# Patient Record
Sex: Female | Born: 1985
Health system: Southern US, Community
[De-identification: ages and names within clinical notes are randomized; demographics above are authoritative.]

## PROBLEM LIST (undated history)

## (undated) DIAGNOSIS — B999 Unspecified infectious disease: Secondary | ICD-10-CM

## (undated) DIAGNOSIS — F419 Anxiety disorder, unspecified: Secondary | ICD-10-CM

## (undated) DIAGNOSIS — Z973 Presence of spectacles and contact lenses: Secondary | ICD-10-CM

## (undated) DIAGNOSIS — O1490 Unspecified pre-eclampsia, unspecified trimester: Secondary | ICD-10-CM

## (undated) DIAGNOSIS — E559 Vitamin D deficiency, unspecified: Secondary | ICD-10-CM

## (undated) DIAGNOSIS — Z8709 Personal history of other diseases of the respiratory system: Secondary | ICD-10-CM

## (undated) DIAGNOSIS — D649 Anemia, unspecified: Secondary | ICD-10-CM

## (undated) DIAGNOSIS — IMO0002 Reserved for concepts with insufficient information to code with codable children: Secondary | ICD-10-CM

## (undated) DIAGNOSIS — Z8759 Personal history of other complications of pregnancy, childbirth and the puerperium: Secondary | ICD-10-CM

## (undated) DIAGNOSIS — Z8619 Personal history of other infectious and parasitic diseases: Secondary | ICD-10-CM

## (undated) DIAGNOSIS — N96 Recurrent pregnancy loss: Secondary | ICD-10-CM

## (undated) DIAGNOSIS — E611 Iron deficiency: Secondary | ICD-10-CM

## (undated) HISTORY — DX: Unspecified pre-eclampsia, unspecified trimester: O14.90

## (undated) HISTORY — DX: Reserved for concepts with insufficient information to code with codable children: IMO0002

## (undated) HISTORY — DX: Anxiety disorder, unspecified: F41.9

## (undated) HISTORY — DX: Iron deficiency: E61.1

## (undated) HISTORY — DX: Anemia, unspecified: D64.9

## (undated) HISTORY — DX: Vitamin D deficiency, unspecified: E55.9

## (undated) HISTORY — PX: COLONOSCOPY: SHX174

## (undated) HISTORY — PX: WISDOM TOOTH EXTRACTION: SHX21

## (undated) HISTORY — DX: Unspecified infectious disease: B99.9

---

## 2008-10-07 DIAGNOSIS — IMO0002 Reserved for concepts with insufficient information to code with codable children: Secondary | ICD-10-CM

## 2008-10-07 DIAGNOSIS — Z8742 Personal history of other diseases of the female genital tract: Secondary | ICD-10-CM

## 2008-10-07 HISTORY — DX: Personal history of other diseases of the female genital tract: Z87.42

## 2008-10-07 HISTORY — DX: Reserved for concepts with insufficient information to code with codable children: IMO0002

## 2011-07-22 ENCOUNTER — Ambulatory Visit: Payer: 59 | Admitting: Gynecology

## 2011-07-22 DIAGNOSIS — O3680X0 Pregnancy with inconclusive fetal viability, not applicable or unspecified: Secondary | ICD-10-CM

## 2011-07-22 DIAGNOSIS — Z3400 Encounter for supervision of normal first pregnancy, unspecified trimester: Secondary | ICD-10-CM

## 2011-07-22 LAB — POCT URINE PREGNANCY: Preg Test, Ur: POSITIVE

## 2011-07-23 LAB — OBSTETRIC PANEL
Antibody Screen: NEGATIVE
Basophils Absolute: 0 10*3/uL (ref 0.0–0.1)
Basophils Relative: 1 % (ref 0–1)
Eosinophils Absolute: 0.2 10*3/uL (ref 0.0–0.7)
Eosinophils Relative: 3 % (ref 0–5)
HCT: 34.7 % — ABNORMAL LOW (ref 36.0–46.0)
Hemoglobin: 11.5 g/dL — ABNORMAL LOW (ref 12.0–15.0)
Hepatitis B Surface Ag: NEGATIVE
Lymphocytes Relative: 22 % (ref 12–46)
Lymphs Abs: 1.8 10*3/uL (ref 0.7–4.0)
MCH: 29.5 pg (ref 26.0–34.0)
MCHC: 33.1 g/dL (ref 30.0–36.0)
MCV: 89 fL (ref 78.0–100.0)
Monocytes Absolute: 0.6 10*3/uL (ref 0.1–1.0)
Monocytes Relative: 8 % (ref 3–12)
Neutro Abs: 5.5 10*3/uL (ref 1.7–7.7)
Neutrophils Relative %: 67 % (ref 43–77)
Platelets: 260 10*3/uL (ref 150–400)
RBC: 3.9 MIL/uL (ref 3.87–5.11)
RDW: 13.2 % (ref 11.5–15.5)
Rh Type: NEGATIVE
Rubella: 115.7 IU/mL — ABNORMAL HIGH
WBC: 8.1 10*3/uL (ref 4.0–10.5)

## 2011-07-23 LAB — HEPATITIS C ANTIBODY: HCV Ab: NEGATIVE

## 2011-07-23 LAB — HIV ANTIBODY (ROUTINE TESTING W REFLEX): HIV: NONREACTIVE

## 2011-07-23 LAB — HEPATITIS B CORE ANTIBODY, IGM: Hep B C IgM: NEGATIVE

## 2011-07-24 LAB — CULTURE, URINE COMPREHENSIVE
Colony Count: NO GROWTH
Organism ID, Bacteria: NO GROWTH

## 2011-07-31 ENCOUNTER — Other Ambulatory Visit: Payer: Self-pay | Admitting: Gynecology

## 2011-07-31 ENCOUNTER — Ambulatory Visit (HOSPITAL_COMMUNITY)
Admission: RE | Admit: 2011-07-31 | Discharge: 2011-07-31 | Disposition: A | Discharge status: Home / Self Care | Payer: 59 | Source: Ambulatory Visit | Attending: Obstetrics & Gynecology | Admitting: Obstetrics & Gynecology

## 2011-07-31 DIAGNOSIS — O3680X0 Pregnancy with inconclusive fetal viability, not applicable or unspecified: Secondary | ICD-10-CM

## 2011-07-31 DIAGNOSIS — Z3689 Encounter for other specified antenatal screening: Secondary | ICD-10-CM | POA: Insufficient documentation

## 2011-08-07 ENCOUNTER — Ambulatory Visit (INDEPENDENT_AMBULATORY_CARE_PROVIDER_SITE_OTHER): Payer: 59 | Admitting: Family Medicine

## 2011-08-07 ENCOUNTER — Encounter: Payer: Self-pay | Admitting: Family Medicine

## 2011-08-07 DIAGNOSIS — O0990 Supervision of high risk pregnancy, unspecified, unspecified trimester: Secondary | ICD-10-CM | POA: Insufficient documentation

## 2011-08-07 DIAGNOSIS — J45909 Unspecified asthma, uncomplicated: Secondary | ICD-10-CM

## 2011-08-07 DIAGNOSIS — Z3400 Encounter for supervision of normal first pregnancy, unspecified trimester: Secondary | ICD-10-CM

## 2011-08-07 DIAGNOSIS — Z113 Encounter for screening for infections with a predominantly sexual mode of transmission: Secondary | ICD-10-CM

## 2011-08-07 DIAGNOSIS — Z1272 Encounter for screening for malignant neoplasm of vagina: Secondary | ICD-10-CM

## 2011-08-07 HISTORY — DX: Unspecified asthma, uncomplicated: J45.909

## 2011-08-07 NOTE — Progress Notes (Signed)
   Subjective:    Gloria Dean is a G1P0 [redacted]w[redacted]d being seen today for her first obstetrical visit.  Her obstetrical history is significant for no problems.. Patient does intend to breast feed. Pregnancy history fully reviewed.  Patient reports no complaints.  Filed Vitals:   08/07/11 0900  BP: 99/54  Weight: 115 lb (52.164 kg)    HISTORY: OB History    Grav Para Term Preterm Abortions TAB SAB Ect Mult Living   1              # Outc Date GA Lbr Len/2nd Wgt Sex Del Anes PTL Lv   1 CUR              Past Medical History  Diagnosis Date  . Abnormal Pap smear 2010  . Infection     chylamidia  . Asthma    Past Surgical History  Procedure Date  . Wisdom tooth extraction    Family History  Problem Relation Age of Onset  . Cancer Maternal Grandfather     lung cancer     Exam    Uterine Size: size equals dates  Pelvic Exam:    Perineum: Normal Perineum   Vulva: normal   Vagina:  normal mucosa       Cervix: nulliparous appearance   Adnexa: normal adnexa   Bony Pelvis: average  System:     Skin: normal coloration and turgor, no rashes    Neurologic: oriented, normal   Extremities: normal strength, tone, and muscle mass   HEENT PERRLA   Mouth/Teeth mucous membranes moist, pharynx normal without lesions   Neck supple   Cardiovascular: regular rate and rhythm   Respiratory:  appears well, vitals normal, no respiratory distress, acyanotic, normal RR, ear and throat exam is normal, neck free of mass or lymphadenopathy, chest clear, no wheezing, crepitations, rhonchi, normal symmetric air entry   Abdomen: soft, non-tender; bowel sounds normal; no masses,  no organomegaly   Urinary: urethral meatus normal      Assessment:    Pregnancy: G1P0 Patient Active Problem List  Diagnoses  . Supervision of normal first pregnancy  . Asthma with allergic rhinitis        Plan:     Initial labs drawn. Prenatal vitamins. Problem list reviewed and updated. Genetic  Screening discussed First Screen: requested.  Ultrasound discussed; fetal survey: discussed.  Follow up in 4 weeks.   PRATT,TANYA S 08/07/2011

## 2011-08-07 NOTE — Patient Instructions (Signed)
Pregnancy - First Trimester During sexual intercourse, millions of sperm go into the vagina. Only 1 sperm will penetrate and fertilize the female egg while it is in the Fallopian tube. One week later, the fertilized egg implants into the wall of the uterus. An embryo begins to develop into a baby. At 6 to 8 weeks, the eyes and face are formed and the heartbeat can be seen on ultrasound. At the end of 12 weeks (first trimester), all the baby's organs are formed. Now that you are pregnant, you will want to do everything you can to have a healthy baby. Two of the most important things are to get good prenatal care and follow your caregiver's instructions. Prenatal care is all the medical care you receive before the baby's birth. It is given to prevent, find, and treat problems during the pregnancy and childbirth. PRENATAL EXAMS  During prenatal visits, your weight, blood pressure and urine are checked. This is done to make sure you are healthy and progressing normally during the pregnancy.   A pregnant woman should gain 25 to 35 pounds during the pregnancy. However, if you are over weight or underweight, your caregiver will advise you regarding your weight.   Your caregiver will ask and answer questions for you.   Blood work, cervical cultures, other necessary tests and a Pap test are done during your prenatal exams. These tests are done to check on your health and the probable health of your baby. Tests are strongly recommended and done for HIV with your permission. This is the virus that causes AIDS. These tests are done because medications can be given to help prevent your baby from being born with this infection should you have been infected without knowing it. Blood work is also used to find out your blood type, previous infections and follow your blood levels (hemoglobin).   Low hemoglobin (anemia) is common during pregnancy. Iron and vitamins are given to help prevent this. Later in the pregnancy,  blood tests for diabetes will be done along with any other tests if any problems develop. You may need tests to make sure you and the baby are doing well.   You may need other tests to make sure you and the baby are doing well.  CHANGES DURING THE FIRST TRIMESTER (THE FIRST 3 MONTHS OF PREGNANCY) Your body goes through many changes during pregnancy. They vary from person to person. Talk to your caregiver about changes you notice and are concerned about. Changes can include:  Your menstrual period stops.   The egg and sperm carry the genes that determine what you look like. Genes from you and your partner are forming a baby. The female genes determine whether the baby is a boy or a girl.   Your body increases in girth and you may feel bloated.   Feeling sick to your stomach (nauseous) and throwing up (vomiting). If the vomiting is uncontrollable, call your caregiver.   Your breasts will begin to enlarge and become tender.   Your nipples may stick out more and become darker.   The need to urinate more. Painful urination may mean you have a bladder infection.   Tiring easily.   Loss of appetite.   Cravings for certain kinds of food.   At first, you may gain or lose a couple of pounds.   You may have changes in your emotions from day to day (excited to be pregnant or concerned something may go wrong with the pregnancy and baby).     You may have more vivid and strange dreams.  HOME CARE INSTRUCTIONS   It is very important to avoid all smoking, alcohol and un-prescribed drugs during your pregnancy. These affect the formation and growth of the baby. Avoid chemicals while pregnant to ensure the delivery of a healthy infant.   Start your prenatal visits by the 12th week of pregnancy. They are usually scheduled monthly at first, then more often in the last 2 months before delivery. Keep your caregiver's appointments. Follow your caregiver's instructions regarding medication use, blood and lab  tests, exercise, and diet.   During pregnancy, you are providing food for you and your baby. Eat regular, well-balanced meals. Choose foods such as meat, fish, milk and other low fat dairy products, vegetables, fruits, and whole-grain breads and cereals. Your caregiver will tell you of the ideal weight gain.   You can help morning sickness by keeping soda crackers at the bedside. Eat a couple before arising in the morning. You may want to use the crackers without salt on them.   Eating 4 to 5 small meals rather than 3 large meals a day also may help the nausea and vomiting.   Drinking liquids between meals instead of during meals also seems to help nausea and vomiting.   A physical sexual relationship may be continued throughout pregnancy if there are no other problems. Problems may be early (premature) leaking of amniotic fluid from the membranes, vaginal bleeding, or belly (abdominal) pain.   Exercise regularly if there are no restrictions. Check with your caregiver or physical therapist if you are unsure of the safety of some of your exercises. Greater weight gain will occur in the last 2 trimesters of pregnancy. Exercising will help:   Control your weight.   Keep you in shape.   Prepare you for labor and delivery.   Help you lose your pregnancy weight after you deliver your baby.   Wear a good support or jogging bra for breast tenderness during pregnancy. This may help if worn during sleep too.   Ask when prenatal classes are available. Begin classes when they are offered.   Do not use hot tubs, steam rooms or saunas.   Wear your seat belt when driving. This protects you and your baby if you are in an accident.   Avoid raw meat, uncooked cheese, cat litter boxes and soil used by cats throughout the pregnancy. These carry germs that can cause birth defects in the baby.   The first trimester is a good time to visit your dentist for your dental health. Getting your teeth cleaned is  OK. Use a softer toothbrush and brush gently during pregnancy.   Ask for help if you have financial, counseling or nutritional needs during pregnancy. Your caregiver will be able to offer counseling for these needs as well as refer you for other special needs.   Do not take any medications or herbs unless told by your caregiver.   Inform your caregiver if there is any mental or physical domestic violence.   Make a list of emergency phone numbers of family, friends, hospital, and police and fire departments.   Write down your questions. Take them to your prenatal visit.   Do not douche.   Do not cross your legs.   If you have to stand for long periods of time, rotate you feet or take small steps in a circle.   You may have more vaginal secretions that may require a sanitary pad. Do not use   tampons or scented sanitary pads.  MEDICATIONS AND DRUG USE IN PREGNANCY  Take prenatal vitamins as directed. The vitamin should contain 1 milligram of folic acid. Keep all vitamins out of reach of children. Only a couple vitamins or tablets containing iron may be fatal to a baby or young child when ingested.   Avoid use of all medications, including herbs, over-the-counter medications, not prescribed or suggested by your caregiver. Only take over-the-counter or prescription medicines for pain, discomfort, or fever as directed by your caregiver. Do not use aspirin, ibuprofen, or naproxen unless directed by your caregiver.   Let your caregiver also know about herbs you may be using.   Alcohol is related to a number of birth defects. This includes fetal alcohol syndrome. All alcohol, in any form, should be avoided completely. Smoking will cause low birth rate and premature babies.   Street or illegal drugs are very harmful to the baby. They are absolutely forbidden. A baby born to an addicted mother will be addicted at birth. The baby will go through the same withdrawal an adult does.   Let your  caregiver know about any medications that you have to take and for what reason you take them.  MISCARRIAGE IS COMMON DURING PREGNANCY A miscarriage does not mean you did something wrong. It is not a reason to worry about getting pregnant again. Your caregiver will help you with questions you may have. If you have a miscarriage, you may need minor surgery. SEEK MEDICAL CARE IF:  You have any concerns or worries during your pregnancy. It is better to call with your questions if you feel they cannot wait, rather than worry about them. SEEK IMMEDIATE MEDICAL CARE IF:   An unexplained oral temperature above 102 F (38.9 C) develops, or as your caregiver suggests.   You have leaking of fluid from the vagina (birth canal). If leaking membranes are suspected, take your temperature and inform your caregiver of this when you call.   There is vaginal spotting or bleeding. Notify your caregiver of the amount and how many pads are used.   You develop a bad smelling vaginal discharge with a change in the color.   You continue to feel sick to your stomach (nauseated) and have no relief from remedies suggested. You vomit blood or coffee ground-like materials.   You lose more than 2 pounds of weight in 1 week.   You gain more than 2 pounds of weight in 1 week and you notice swelling of your face, hands, feet, or legs.   You gain 5 pounds or more in 1 week (even if you do not have swelling of your hands, face, legs, or feet).   You get exposed to German measles and have never had them.   You are exposed to fifth disease or chickenpox.   You develop belly (abdominal) pain. Round ligament discomfort is a common non-cancerous (benign) cause of abdominal pain in pregnancy. Your caregiver still must evaluate this.   You develop headache, fever, diarrhea, pain with urination, or shortness of breath.   You fall or are in a car accident or have any kind of trauma.   There is mental or physical violence in  your home.  Document Released: 09/17/2001 Document Revised: 06/05/2011 Document Reviewed: 03/21/2009 [employer] Patient Information 2012 [employer]. 

## 2011-08-27 ENCOUNTER — Ambulatory Visit (HOSPITAL_COMMUNITY)
Admission: RE | Admit: 2011-08-27 | Discharge: 2011-08-27 | Disposition: A | Discharge status: Home / Self Care | Payer: 59 | Source: Ambulatory Visit | Attending: Family Medicine | Admitting: Family Medicine

## 2011-08-27 DIAGNOSIS — Z3400 Encounter for supervision of normal first pregnancy, unspecified trimester: Secondary | ICD-10-CM

## 2011-08-27 DIAGNOSIS — Z3689 Encounter for other specified antenatal screening: Secondary | ICD-10-CM | POA: Insufficient documentation

## 2011-08-27 DIAGNOSIS — O3510X0 Maternal care for (suspected) chromosomal abnormality in fetus, unspecified, not applicable or unspecified: Secondary | ICD-10-CM | POA: Insufficient documentation

## 2011-08-27 DIAGNOSIS — O351XX0 Maternal care for (suspected) chromosomal abnormality in fetus, not applicable or unspecified: Secondary | ICD-10-CM | POA: Insufficient documentation

## 2011-08-28 NOTE — Progress Notes (Signed)
Spoke with patient. Patient aware of pap result, will do colpo on next visit. 09/05/11.

## 2011-09-05 ENCOUNTER — Ambulatory Visit (INDEPENDENT_AMBULATORY_CARE_PROVIDER_SITE_OTHER): Payer: 59 | Admitting: Obstetrics & Gynecology

## 2011-09-05 VITALS — BP 105/62 | Wt 119.0 lb

## 2011-09-05 DIAGNOSIS — R87612 Low grade squamous intraepithelial lesion on cytologic smear of cervix (LGSIL): Secondary | ICD-10-CM | POA: Insufficient documentation

## 2011-09-05 DIAGNOSIS — Z3400 Encounter for supervision of normal first pregnancy, unspecified trimester: Secondary | ICD-10-CM

## 2011-09-05 DIAGNOSIS — R87620 Atypical squamous cells of undetermined significance on cytologic smear of vagina (ASC-US): Secondary | ICD-10-CM

## 2011-09-05 DIAGNOSIS — O3440 Maternal care for other abnormalities of cervix, unspecified trimester: Secondary | ICD-10-CM

## 2011-09-05 DIAGNOSIS — Z23 Encounter for immunization: Secondary | ICD-10-CM

## 2011-09-05 HISTORY — DX: Low grade squamous intraepithelial lesion on cytologic smear of cervix (LGSIL): R87.612

## 2011-09-05 NOTE — Progress Notes (Signed)
No complaints or concerns.  Labor precautions reviewed.   Also here for colposcopy for ASCUS, +HRHPV Patient given informed consent, signed copy in the chart, time out was performed.  Placed in lithotomy position. Cervix viewed with speculum and colposcope after application of acetic acid.   Colposcopy adequate?  Yes Acetowhite lesions?1 o'clock, no other anomalies, biopsy not taken Punctation?No Mosaicism?  No Abnormal vasculature?  No Biopsies?No ECC?No Will repeat pap smear postpartum,  If still abnormal, repeat colposcopy and focus on the 1 o'clock position

## 2011-09-05 NOTE — Patient Instructions (Signed)
Pregnancy - Second Trimester The second trimester of pregnancy (3 to 6 months) is a period of rapid growth for you and your baby. At the end of the sixth month, your baby is about 9 inches long and weighs 1 1/2 pounds. You will begin to feel the baby move between 18 and 20 weeks of the pregnancy. This is called quickening. Weight gain is faster. A clear fluid (colostrum) may leak out of your breasts. You may feel small contractions of the womb (uterus). This is known as false labor or Braxton-Hicks contractions. This is like a practice for labor when the baby is ready to be born. Usually, the problems with morning sickness have usually passed by the end of your first trimester. Some women develop small dark blotches (called cholasma, mask of pregnancy) on their face that usually goes away after the baby is born. Exposure to the sun makes the blotches worse. Acne may also develop in some pregnant women and pregnant women who have acne, may find that it goes away. PRENATAL EXAMS  Blood work may continue to be done during prenatal exams. These tests are done to check on your health and the probable health of your baby. Blood work is used to follow your blood levels (hemoglobin). Anemia (low hemoglobin) is common during pregnancy. Iron and vitamins are given to help prevent this. You will also be checked for diabetes between 24 and 28 weeks of the pregnancy. Some of the previous blood tests may be repeated.   The size of the uterus is measured during each visit. This is to make sure that the baby is continuing to grow properly according to the dates of the pregnancy.   Your blood pressure is checked every prenatal visit. This is to make sure you are not getting toxemia.   Your urine is checked to make sure you do not have an infection, diabetes or protein in the urine.   Your weight is checked often to make sure gains are happening at the suggested rate. This is to ensure that both you and your baby are  growing normally.   Sometimes, an ultrasound is performed to confirm the proper growth and development of the baby. This is a test which bounces harmless sound waves off the baby so your caregiver can more accurately determine due dates.  Sometimes, a specialized test is done on the amniotic fluid surrounding the baby. This test is called an amniocentesis. The amniotic fluid is obtained by sticking a needle into the belly (abdomen). This is done to check the chromosomes in instances where there is a concern about possible genetic problems with the baby. It is also sometimes done near the end of pregnancy if an early delivery is required. In this case, it is done to help make sure the baby's lungs are mature enough for the baby to live outside of the womb. CHANGES OCCURING IN THE SECOND TRIMESTER OF PREGNANCY Your body goes through many changes during pregnancy. They vary from person to person. Talk to your caregiver about changes you notice that you are concerned about.  During the second trimester, you will likely have an increase in your appetite. It is normal to have cravings for certain foods. This varies from person to person and pregnancy to pregnancy.   Your lower abdomen will begin to bulge.   You may have to urinate more often because the uterus and baby are pressing on your bladder. It is also common to get more bladder infections during pregnancy (  pain with urination). You can help this by drinking lots of fluids and emptying your bladder before and after intercourse.   You may begin to get stretch marks on your hips, abdomen, and breasts. These are normal changes in the body during pregnancy. There are no exercises or medications to take that prevent this change.   You may begin to develop swollen and bulging veins (varicose veins) in your legs. Wearing support hose, elevating your feet for 15 minutes, 3 to 4 times a day and limiting salt in your diet helps lessen the problem.    Heartburn may develop as the uterus grows and pushes up against the stomach. Antacids recommended by your caregiver helps with this problem. Also, eating smaller meals 4 to 5 times a day helps.   Constipation can be treated with a stool softener or adding bulk to your diet. Drinking lots of fluids, vegetables, fruits, and whole grains are helpful.   Exercising is also helpful. If you have been very active up until your pregnancy, most of these activities can be continued during your pregnancy. If you have been less active, it is helpful to start an exercise program such as walking.   Hemorrhoids (varicose veins in the rectum) may develop at the end of the second trimester. Warm sitz baths and hemorrhoid cream recommended by your caregiver helps hemorrhoid problems.   Backaches may develop during this time of your pregnancy. Avoid heavy lifting, wear low heal shoes and practice good posture to help with backache problems.   Some pregnant women develop tingling and numbness of their hand and fingers because of swelling and tightening of ligaments in the wrist (carpel tunnel syndrome). This goes away after the baby is born.   As your breasts enlarge, you may have to get a bigger bra. Get a comfortable, cotton, support bra. Do not get a nursing bra until the last month of the pregnancy if you will be nursing the baby.   You may get a dark line from your belly button to the pubic area called the linea nigra.   You may develop rosy cheeks because of increase blood flow to the face.   You may develop spider looking lines of the face, neck, arms and chest. These go away after the baby is born.  HOME CARE INSTRUCTIONS   It is extremely important to avoid all smoking, herbs, alcohol, and unprescribed drugs during your pregnancy. These chemicals affect the formation and growth of the baby. Avoid these chemicals throughout the pregnancy to ensure the delivery of a healthy infant.   Most of your home  care instructions are the same as suggested for the first trimester of your pregnancy. Keep your caregiver's appointments. Follow your caregiver's instructions regarding medication use, exercise and diet.   During pregnancy, you are providing food for you and your baby. Continue to eat regular, well-balanced meals. Choose foods such as meat, fish, milk and other low fat dairy products, vegetables, fruits, and whole-grain breads and cereals. Your caregiver will tell you of the ideal weight gain.   A physical sexual relationship may be continued up until near the end of pregnancy if there are no other problems. Problems could include early (premature) leaking of amniotic fluid from the membranes, vaginal bleeding, abdominal pain, or other medical or pregnancy problems.   Exercise regularly if there are no restrictions. Check with your caregiver if you are unsure of the safety of some of your exercises. The greatest weight gain will occur in the   last 2 trimesters of pregnancy. Exercise will help you:   Control your weight.   Get you in shape for labor and delivery.   Lose weight after you have the baby.   Wear a good support or jogging bra for breast tenderness during pregnancy. This may help if worn during sleep. Pads or tissues may be used in the bra if you are leaking colostrum.   Do not use hot tubs, steam rooms or saunas throughout the pregnancy.   Wear your seat belt at all times when driving. This protects you and your baby if you are in an accident.   Avoid raw meat, uncooked cheese, cat litter boxes and soil used by cats. These carry germs that can cause birth defects in the baby.   The second trimester is also a good time to visit your dentist for your dental health if this has not been done yet. Getting your teeth cleaned is OK. Use a soft toothbrush. Brush gently during pregnancy.   It is easier to loose urine during pregnancy. Tightening up and strengthening the pelvic muscles will  help with this problem. Practice stopping your urination while you are going to the bathroom. These are the same muscles you need to strengthen. It is also the muscles you would use as if you were trying to stop from passing gas. You can practice tightening these muscles up 10 times a set and repeating this about 3 times per day. Once you know what muscles to tighten up, do not perform these exercises during urination. It is more likely to contribute to an infection by backing up the urine.   Ask for help if you have financial, counseling or nutritional needs during pregnancy. Your caregiver will be able to offer counseling for these needs as well as refer you for other special needs.   Your skin may become oily. If so, wash your face with mild soap, use non-greasy moisturizer and oil or cream based makeup.  MEDICATIONS AND DRUG USE IN PREGNANCY  Take prenatal vitamins as directed. The vitamin should contain 1 milligram of folic acid. Keep all vitamins out of reach of children. Only a couple vitamins or tablets containing iron may be fatal to a baby or young child when ingested.   Avoid use of all medications, including herbs, over-the-counter medications, not prescribed or suggested by your caregiver. Only take over-the-counter or prescription medicines for pain, discomfort, or fever as directed by your caregiver. Do not use aspirin.   Let your caregiver also know about herbs you may be using.   Alcohol is related to a number of birth defects. This includes fetal alcohol syndrome. All alcohol, in any form, should be avoided completely. Smoking will cause low birth rate and premature babies.   Street or illegal drugs are very harmful to the baby. They are absolutely forbidden. A baby born to an addicted mother will be addicted at birth. The baby will go through the same withdrawal an adult does.  SEEK MEDICAL CARE IF:  You have any concerns or worries during your pregnancy. It is better to call with  your questions if you feel they cannot wait, rather than worry about them. SEEK IMMEDIATE MEDICAL CARE IF:   An unexplained oral temperature above 102 F (38.9 C) develops, or as your caregiver suggests.   You have leaking of fluid from the vagina (birth canal). If leaking membranes are suspected, take your temperature and tell your caregiver of this when you call.   There   is vaginal spotting, bleeding, or passing clots. Tell your caregiver of the amount and how many pads are used. Light spotting in pregnancy is common, especially following intercourse.   You develop a bad smelling vaginal discharge with a change in the color from clear to white.   You continue to feel sick to your stomach (nauseated) and have no relief from remedies suggested. You vomit blood or coffee ground-like materials.   You lose more than 2 pounds of weight or gain more than 2 pounds of weight over 1 week, or as suggested by your caregiver.   You notice swelling of your face, hands, feet, or legs.   You get exposed to German measles and have never had them.   You are exposed to fifth disease or chickenpox.   You develop belly (abdominal) pain. Round ligament discomfort is a common non-cancerous (benign) cause of abdominal pain in pregnancy. Your caregiver still must evaluate you.   You develop a bad headache that does not go away.   You develop fever, diarrhea, pain with urination, or shortness of breath.   You develop visual problems, blurry, or double vision.   You fall or are in a car accident or any kind of trauma.   There is mental or physical violence at home.  Document Released: 09/17/2001 Document Revised: 06/05/2011 Document Reviewed: 03/22/2009 [employer] Patient Information 2012 [employer]. 

## 2011-09-06 ENCOUNTER — Other Ambulatory Visit: Payer: Self-pay

## 2011-10-03 ENCOUNTER — Ambulatory Visit (INDEPENDENT_AMBULATORY_CARE_PROVIDER_SITE_OTHER): Payer: 59 | Admitting: Obstetrics & Gynecology

## 2011-10-03 VITALS — BP 96/55 | Wt 119.0 lb

## 2011-10-03 DIAGNOSIS — Z3400 Encounter for supervision of normal first pregnancy, unspecified trimester: Secondary | ICD-10-CM

## 2011-10-03 NOTE — Progress Notes (Signed)
Routine visit. She will get her anatomy scan next week and her MSAFP today. She has no concerns or questions today.

## 2011-10-08 NOTE — L&D Delivery Note (Signed)
Delivery Note At 4:49 PM a viable female was delivered via Vaginal, Spontaneous Delivery (Presentation: Right Occiput Anterior).  APGAR: 8, 9; weight .   Placenta status: Intact, Spontaneous.  Cord: 3 vessels.  No complications.    Anesthesia: None  Episiotomy: None Lacerations: 1st degree;Labial, hemostatic, did not require repair Est. Blood Loss (mL): 200  Mom to postpartum.  Baby to nursery-stable.  Eino Farber Jerol[facility] CNM present for delivery.  Chancy Hurter MD 02/28/2012, 5:08 PM

## 2011-10-08 NOTE — L&D Delivery Note (Signed)
I was present for delivery and agree with note above. MUHAMMAD,Sharion Grieves  

## 2011-10-10 ENCOUNTER — Encounter: Payer: Self-pay | Admitting: Obstetrics & Gynecology

## 2011-10-10 ENCOUNTER — Ambulatory Visit (HOSPITAL_COMMUNITY)
Admission: RE | Admit: 2011-10-10 | Discharge: 2011-10-10 | Disposition: A | Discharge status: Home / Self Care | Payer: 59 | Source: Ambulatory Visit | Attending: Obstetrics & Gynecology | Admitting: Obstetrics & Gynecology

## 2011-10-10 DIAGNOSIS — O358XX0 Maternal care for other (suspected) fetal abnormality and damage, not applicable or unspecified: Secondary | ICD-10-CM | POA: Insufficient documentation

## 2011-10-10 DIAGNOSIS — Z3400 Encounter for supervision of normal first pregnancy, unspecified trimester: Secondary | ICD-10-CM

## 2011-10-10 DIAGNOSIS — Z363 Encounter for antenatal screening for malformations: Secondary | ICD-10-CM | POA: Insufficient documentation

## 2011-10-10 DIAGNOSIS — Z1389 Encounter for screening for other disorder: Secondary | ICD-10-CM | POA: Insufficient documentation

## 2011-10-10 LAB — ALPHA FETOPROTEIN, MATERNAL
AFP: 75.3 IU/mL
MoM for AFP: 1.74
Open Spina bifida: NEGATIVE
Osb Risk: 1:1460 {titer}

## 2011-10-31 ENCOUNTER — Ambulatory Visit (INDEPENDENT_AMBULATORY_CARE_PROVIDER_SITE_OTHER): Payer: 59 | Admitting: Obstetrics & Gynecology

## 2011-10-31 VITALS — BP 112/66 | Ht 63.0 in | Wt 126.0 lb

## 2011-10-31 DIAGNOSIS — Z3400 Encounter for supervision of normal first pregnancy, unspecified trimester: Secondary | ICD-10-CM

## 2011-10-31 NOTE — Progress Notes (Signed)
Patient is here for routine prenatal check, she is doing well. 

## 2011-10-31 NOTE — Progress Notes (Signed)
Routine visit. No problems.

## 2011-11-26 ENCOUNTER — Ambulatory Visit (INDEPENDENT_AMBULATORY_CARE_PROVIDER_SITE_OTHER): Payer: 59 | Admitting: Obstetrics and Gynecology

## 2011-11-26 DIAGNOSIS — J45909 Unspecified asthma, uncomplicated: Secondary | ICD-10-CM

## 2011-11-26 DIAGNOSIS — O3440 Maternal care for other abnormalities of cervix, unspecified trimester: Secondary | ICD-10-CM

## 2011-11-26 DIAGNOSIS — Z3400 Encounter for supervision of normal first pregnancy, unspecified trimester: Secondary | ICD-10-CM

## 2011-11-26 NOTE — Patient Instructions (Signed)
Genital Warts Genital warts are a sexually transmitted infection. They may appear as small bumps on the tissues of the genital area. CAUSES  Genital warts are caused by a virus called human papillomavirus (HPV). HPV is the most common sexually transmitted disease (STD) and infection of the sex organs. This infection is spread by having unprotected sex with an infected person. It can be spread by vaginal, anal, and oral sex. Many people do not know they are infected. They may be infected for years without problems. However, even if they do not have problems, they can unknowingly pass the infection to their sexual partners. SYMPTOMS   Itching and irritation in the genital area.   Warts that bleed.   Painful sexual intercourse.  DIAGNOSIS  Warts are usually recognized with the naked eye on the vagina, vulva, perineum, anus, and rectum. Certain tests can also diagnose genital warts, such as:  A Pap test.   A tissue sample (biopsy) exam.   Colposcopy. A magnifying tool is used to examine the vagina and cervix. The HPV cells will change color when certain solutions are used.  TREATMENT  Warts can be removed by:  Applying certain chemicals, such as cantharidin or podophyllin.   Liquid nitrogen freezing (cryotherapy).   Immunotherapy with candida or trichophyton injections.   Laser treatment.   Burning with an electrified probe (electrocautery).   Interferon injections.   Surgery.  PREVENTION  HPV vaccination can help prevent HPV infections that cause genital warts and that cause cancer of the cervix. It is recommended that the vaccination be given to people between the ages 9 to 26 years old. The vaccine might not work as well or might not work at all if you already have HPV. It should not be given to pregnant women. HOME CARE INSTRUCTIONS   It is important to follow your caregiver's instructions. The warts will not go away without treatment. Repeat treatments are often needed to get  rid of warts. Even after it appears that the warts are gone, the normal tissue underneath often remains infected.   Do not try to treat genital warts with medicine used to treat hand warts. This type of medicine is strong and can burn the skin in the genital area, causing more damage.   Tell your past and current sexual partner(s) that you have genital warts. They may be infected also and need treatment.   Avoid sexual contact while being treated.   Do not touch or scratch the warts. The infection may spread to other parts of your body.   Women with genital warts should have a cervical cancer check (Pap test) at least once a year. This type of cancer is slow-growing and can be cured if found early. Chances of developing cervical cancer are increased with HPV.   Inform your obstetrician about your warts in the event of pregnancy. This virus can be passed to the baby's respiratory tract. Discuss this with your caregiver.   Use a condom during sexual intercourse. Following treatment, the use of condoms will help prevent reinfection.   Ask your caregiver about using over-the-counter anti-itch creams.  SEEK MEDICAL CARE IF:   Your treated skin becomes red, swollen, or painful.   You have a fever.   You feel generally ill.   You feel little lumps in and around your genital area.   You are bleeding or have painful sexual intercourse.  MAKE SURE YOU:   Understand these instructions.   Will watch your condition.   Will   get help right away if you are not doing well or get worse.  Document Released: 09/20/2000 Document Revised: 06/06/2011 Document Reviewed: 04/01/2011 [employer] Patient Information 2012 [employer]. 

## 2011-11-26 NOTE — Progress Notes (Signed)
Patient doing well obstetrically. Patient reports noticing a fe bumps in perineal area. Upon examination 2 small condylomas are visualized inferior to the rectum less than 5 mm in size. Discussed diagnosis of genital warts and treatment options. Information provided to the patient. Patient desires TCA treatment which will be initiated at her next visit. 1hr GCT and labs at next visit.

## 2011-11-27 ENCOUNTER — Encounter: Payer: 59 | Admitting: Obstetrics & Gynecology

## 2011-12-02 ENCOUNTER — Ambulatory Visit (INDEPENDENT_AMBULATORY_CARE_PROVIDER_SITE_OTHER): Payer: 59 | Admitting: Family Medicine

## 2011-12-02 DIAGNOSIS — A630 Anogenital (venereal) warts: Secondary | ICD-10-CM

## 2011-12-02 DIAGNOSIS — Z3480 Encounter for supervision of other normal pregnancy, unspecified trimester: Secondary | ICD-10-CM

## 2011-12-02 NOTE — Progress Notes (Signed)
Patient is here today for treatment of condyloma.

## 2011-12-02 NOTE — Patient Instructions (Signed)
Breastfeeding BENEFITS OF BREASTFEEDING For the baby  The first milk (colostrum) helps the baby's digestive system function better.   There are antibodies from the mother in the milk that help the baby fight off infections.   The baby has a lower incidence of asthma, allergies, and SIDS (sudden infant death syndrome).   The nutrients in breast milk are better than formulas for the baby and helps the baby's brain grow better.   Babies who breastfeed have less gas, colic, and constipation.  For the mother  Breastfeeding helps develop a very special bond between mother and baby.   It is more convenient, always available at the correct temperature and cheaper than formula feeding.   It burns calories in the mother and helps with losing weight that was gained during pregnancy.   It makes the uterus contract back down to normal size faster and slows bleeding following delivery.   Breastfeeding mothers have a lower risk of developing breast cancer.  NURSE FREQUENTLY  A healthy, full-term baby may breastfeed as often as every hour or space his or her feedings to every 3 hours.   How often to nurse will vary from baby to baby. Watch your baby for signs of hunger, not the clock.   Nurse as often as the baby requests, or when you feel the need to reduce the fullness of your breasts.   Awaken the baby if it has been 3 to 4 hours since the last feeding.   Frequent feeding will help the mother make more milk and will prevent problems like sore nipples and engorgement of the breasts.  BABY'S POSITION AT THE BREAST  Whether lying down or sitting, be sure that the baby's tummy is facing your tummy.   Support the breast with 4 fingers underneath the breast and the thumb above. Make sure your fingers are well away from the nipple and baby's mouth.   Stroke the baby's lips and cheek closest to the breast gently with your finger or nipple.   When the baby's mouth is open wide enough, place  all of your nipple and as much of the dark area around the nipple as possible into your baby's mouth.   Pull the baby in close so the tip of the nose and the baby's cheeks touch the breast during the feeding.  FEEDINGS  The length of each feeding varies from baby to baby and from feeding to feeding.   The baby must suck about 2 to 3 minutes for your milk to get to him or her. This is called a "let down." For this reason, allow the baby to feed on each breast as long as he or she wants. Your baby will end the feeding when he or she has received the right balance of nutrients.   To break the suction, put your finger into the corner of the baby's mouth and slide it between his or her gums before removing your breast from his or her mouth. This will help prevent sore nipples.  REDUCING BREAST ENGORGEMENT  In the first week after your baby is born, you may experience signs of breast engorgement. When breasts are engorged, they feel heavy, warm, full, and may be tender to the touch. You can reduce engorgement if you:   Nurse frequently, every 2 to 3 hours. Mothers who breastfeed early and often have fewer problems with engorgement.   Place light ice packs on your breasts between feedings. This reduces swelling. Wrap the ice packs in a   lightweight towel to protect your skin.   Apply moist hot packs to your breast for 5 to 10 minutes before each feeding. This increases circulation and helps the milk flow.   Gently massage your breast before and during the feeding.   Make sure that the baby empties at least one breast at every feeding before switching sides.   Use a breast pump to empty the breasts if your baby is sleepy or not nursing well. You may also want to pump if you are returning to work or or you feel you are getting engorged.   Avoid bottle feeds, pacifiers or supplemental feedings of water or juice in place of breastfeeding.   Be sure the baby is latched on and positioned properly while  breastfeeding.   Prevent fatigue, stress, and anemia.   Wear a supportive bra, avoiding underwire styles.   Eat a balanced diet with enough fluids.  If you follow these suggestions, your engorgement should improve in 24 to 48 hours. If you are still experiencing difficulty, call your lactation consultant or caregiver. IS MY BABY GETTING ENOUGH MILK? Sometimes, mothers worry about whether their babies are getting enough milk. You can be assured that your baby is getting enough milk if:  The baby is actively sucking and you hear swallowing.   The baby nurses at least 8 to 12 times in a 24 hour time period. Nurse your baby until he or she unlatches or falls asleep at the first breast (at least 10 to 20 minutes), then offer the second side.   The baby is wetting 5 to 6 disposable diapers (6 to 8 cloth diapers) in a 24 hour period by 5 to 6 days of age.   The baby is having at least 2 to 3 stools every 24 hours for the first few months. Breast milk is all the food your baby needs. It is not necessary for your baby to have water or formula. In fact, to help your breasts make more milk, it is best not to give your baby supplemental feedings during the early weeks.   The stool should be soft and yellow.   The baby should gain 4 to 7 ounces per week after he is 4 days old.  TAKE CARE OF YOURSELF Take care of your breasts by:  Bathing or showering daily.   Avoiding the use of soaps on your nipples.   Start feedings on your left breast at one feeding and on your right breast at the next feeding.   You will notice an increase in your milk supply 2 to 5 days after delivery. You may feel some discomfort from engorgement, which makes your breasts very firm and often tender. Engorgement "peaks" out within 24 to 48 hours. In the meantime, apply warm moist towels to your breasts for 5 to 10 minutes before feeding. Gentle massage and expression of some milk before feeding will soften your breasts, making  it easier for your baby to latch on. Wear a well fitting nursing bra and air dry your nipples for 10 to 15 minutes after each feeding.   Only use cotton bra pads.   Only use pure lanolin on your nipples after nursing. You do not need to wash it off before nursing.  Take care of yourself by:   Eating well-balanced meals and nutritious snacks.   Drinking milk, fruit juice, and water to satisfy your thirst (about 8 glasses a day).   Getting plenty of rest.   Increasing calcium in   your diet (1200 mg a day).   Avoiding foods that you notice affect the baby in a bad way.  SEEK MEDICAL CARE IF:   You have any questions or difficulty with breastfeeding.   You need help.   You have a hard, red, sore area on your breast, accompanied by a fever of 100.5 F (38.1 C) or more.   Your baby is too sleepy to eat well or is having trouble sleeping.   Your baby is wetting less than 6 diapers per day, by 5 days of age.   Your baby's skin or white part of his or her eyes is more yellow than it was in the hospital.   You feel depressed.  Document Released: 09/23/2005 Document Revised: 06/05/2011 Document Reviewed: 05/08/2009 [employer] Patient Information 2012 [employer]. 

## 2011-12-02 NOTE — Progress Notes (Signed)
Patient identified, informed consent signed and copy in chart, time out performed.    Areas of typical appearing genital warts noted at post fourchette.  Surrounding area coated with water based lubricant and TCA applied until warts had white appearance.   Patient tolerated the procedure well.    Post procedure instructions given and patient told to wash area in thirty minutes.  Return in 1 week for next treatment.

## 2011-12-04 ENCOUNTER — Encounter: Payer: 59 | Admitting: Obstetrics and Gynecology

## 2011-12-17 ENCOUNTER — Ambulatory Visit (INDEPENDENT_AMBULATORY_CARE_PROVIDER_SITE_OTHER): Payer: 59 | Admitting: Family Medicine

## 2011-12-17 DIAGNOSIS — O360990 Maternal care for other rhesus isoimmunization, unspecified trimester, not applicable or unspecified: Secondary | ICD-10-CM

## 2011-12-17 DIAGNOSIS — O26899 Other specified pregnancy related conditions, unspecified trimester: Secondary | ICD-10-CM

## 2011-12-17 DIAGNOSIS — O361190 Maternal care for Anti-A sensitization, unspecified trimester, not applicable or unspecified: Secondary | ICD-10-CM

## 2011-12-17 DIAGNOSIS — Z8619 Personal history of other infectious and parasitic diseases: Secondary | ICD-10-CM

## 2011-12-17 DIAGNOSIS — A630 Anogenital (venereal) warts: Secondary | ICD-10-CM

## 2011-12-17 DIAGNOSIS — Z3400 Encounter for supervision of normal first pregnancy, unspecified trimester: Secondary | ICD-10-CM

## 2011-12-17 DIAGNOSIS — Z3480 Encounter for supervision of other normal pregnancy, unspecified trimester: Secondary | ICD-10-CM

## 2011-12-17 DIAGNOSIS — Z6791 Unspecified blood type, Rh negative: Secondary | ICD-10-CM

## 2011-12-17 HISTORY — DX: Personal history of other infectious and parasitic diseases: Z86.19

## 2011-12-17 HISTORY — DX: Anogenital (venereal) warts: A63.0

## 2011-12-17 MED ORDER — RHO D IMMUNE GLOBULIN 1500 UNIT/2ML IJ SOLN
300.0000 ug | Freq: Once | INTRAMUSCULAR | Status: AC
  Administered 2011-12-17: 300 ug via INTRAMUSCULAR

## 2011-12-17 NOTE — Progress Notes (Signed)
28 wk labs, rhogam today  Procedure: Patient identified, informed consent signed and copy in chart, time out performed.    Areas of typical appearing genital warts noted left labia majora.  Surrounding area coated with water based lubricant and TCA applied until warts had white appearance.   Patient tolerated the procedure well.    Post procedure instructions given and patient told to wash area in thirty minutes.  Return in 1 week for next treatment.

## 2011-12-17 NOTE — Patient Instructions (Addendum)
Genital Warts Genital warts are a sexually transmitted infection. They may appear as small bumps on the tissues of the genital area. CAUSES  Genital warts are caused by a virus called human papillomavirus (HPV). HPV is the most common sexually transmitted disease (STD) and infection of the sex organs. This infection is spread by having unprotected sex with an infected person. It can be spread by vaginal, anal, and oral sex. Many people do not know they are infected. They may be infected for years without problems. However, even if they do not have problems, they can unknowingly pass the infection to their sexual partners. SYMPTOMS   Itching and irritation in the genital area.   Warts that bleed.   Painful sexual intercourse.  DIAGNOSIS  Warts are usually recognized with the naked eye on the vagina, vulva, perineum, anus, and rectum. Certain tests can also diagnose genital warts, such as:  A Pap test.   A tissue sample (biopsy) exam.   Colposcopy. A magnifying tool is used to examine the vagina and cervix. The HPV cells will change color when certain solutions are used.  TREATMENT  Warts can be removed by:  Applying certain chemicals, such as cantharidin or podophyllin.   Liquid nitrogen freezing (cryotherapy).   Immunotherapy with candida or trichophyton injections.   Laser treatment.   Burning with an electrified probe (electrocautery).   Interferon injections.   Surgery.  PREVENTION  HPV vaccination can help prevent HPV infections that cause genital warts and that cause cancer of the cervix. It is recommended that the vaccination be given to people between the ages 48 to 72 years old. The vaccine might not work as well or might not work at all if you already have HPV. It should not be given to pregnant women. HOME CARE INSTRUCTIONS   It is important to follow your caregiver's instructions. The warts will not go away without treatment. Repeat treatments are often needed to get  rid of warts. Even after it appears that the warts are gone, the normal tissue underneath often remains infected.   Do not try to treat genital warts with medicine used to treat hand warts. This type of medicine is strong and can burn the skin in the genital area, causing more damage.   Tell your past and current sexual partner(s) that you have genital warts. They may be infected also and need treatment.   Avoid sexual contact while being treated.   Do not touch or scratch the warts. The infection may spread to other parts of your body.   Women with genital warts should have a cervical cancer check (Pap test) at least once a year. This type of cancer is slow-growing and can be cured if found early. Chances of developing cervical cancer are increased with HPV.   Inform your obstetrician about your warts in the event of pregnancy. This virus can be passed to the baby's respiratory tract. Discuss this with your caregiver.   Use a condom during sexual intercourse. Following treatment, the use of condoms will help prevent reinfection.   Ask your caregiver about using over-the-counter anti-itch creams.  SEEK MEDICAL CARE IF:   Your treated skin becomes red, swollen, or painful.   You have a fever.   You feel generally ill.   You feel little lumps in and around your genital area.   You are bleeding or have painful sexual intercourse.  MAKE SURE YOU:   Understand these instructions.   Will watch your condition.   Will  get help right away if you are not doing well or get worse.  Document Released: 09/20/2000 Document Revised: 09/12/2011 Document Reviewed: 04/01/2011 [facility] Patient Information 2012 [place], [place]. Pregnancy - Third Trimester The third trimester of pregnancy (the last 3 months) is a period of the most rapid growth for you and your baby. The baby approaches a length of 20 inches and a weight of 6 to 10 pounds. The baby is adding on fat and getting ready for life outside  your body. While inside, babies have periods of sleeping and waking, suck their thumbs, and hiccups. You can often feel small contractions of the uterus. This is false labor. It is also called Braxton-Hicks contractions. This is like a practice for labor. The usual problems in this stage of pregnancy include more difficulty breathing, swelling of the hands and feet from water retention, and having to urinate more often because of the uterus and baby pressing on your bladder.  PRENATAL EXAMS  Blood work may continue to be done during prenatal exams. These tests are done to check on your health and the probable health of your baby. Blood work is used to follow your blood levels (hemoglobin). Anemia (low hemoglobin) is common during pregnancy. Iron and vitamins are given to help prevent this. You may also continue to be checked for diabetes. Some of the past blood tests may be done again.   The size of the uterus is measured during each visit. This makes sure your baby is growing properly according to your pregnancy dates.   Your blood pressure is checked every prenatal visit. This is to make sure you are not getting toxemia.   Your urine is checked every prenatal visit for infection, diabetes and protein.   Your weight is checked at each visit. This is done to make sure gains are happening at the suggested rate and that you and your baby are growing normally.   Sometimes, an ultrasound is performed to confirm the position and the proper growth and development of the baby. This is a test done that bounces harmless sound waves off the baby so your caregiver can more accurately determine due dates.   Discuss the type of pain medication and anesthesia you will have during your labor and delivery.   Discuss the possibility and anesthesia if a Cesarean Section might be necessary.   Inform your caregiver if there is any mental or physical violence at home.  Sometimes, a specialized non-stress test,  contraction stress test and biophysical profile are done to make sure the baby is not having a problem. Checking the amniotic fluid surrounding the baby is called an amniocentesis. The amniotic fluid is removed by sticking a needle into the belly (abdomen). This is sometimes done near the end of pregnancy if an early delivery is required. In this case, it is done to help make sure the baby's lungs are mature enough for the baby to live outside of the womb. If the lungs are not mature and it is unsafe to deliver the baby, an injection of cortisone medication is given to the mother 1 to 2 days before the delivery. This helps the baby's lungs mature and makes it safer to deliver the baby. CHANGES OCCURING IN THE THIRD TRIMESTER OF PREGNANCY Your body goes through many changes during pregnancy. They vary from person to person. Talk to your caregiver about changes you notice and are concerned about.  During the last trimester, you have probably had an increase in your appetite. It  is normal to have cravings for certain foods. This varies from person to person and pregnancy to pregnancy.   You may begin to get stretch marks on your hips, abdomen, and breasts. These are normal changes in the body during pregnancy. There are no exercises or medications to take which prevent this change.   Constipation may be treated with a stool softener or adding bulk to your diet. Drinking lots of fluids, fiber in vegetables, fruits, and whole grains are helpful.   Exercising is also helpful. If you have been very active up until your pregnancy, most of these activities can be continued during your pregnancy. If you have been less active, it is helpful to start an exercise program such as walking. Consult your caregiver before starting exercise programs.   Avoid all smoking, alcohol, un-prescribed drugs, herbs and "street drugs" during your pregnancy. These chemicals affect the formation and growth of the baby. Avoid chemicals  throughout the pregnancy to ensure the delivery of a healthy infant.   Backache, varicose veins and hemorrhoids may develop or get worse.   You will tire more easily in the third trimester, which is normal.   The baby's movements may be stronger and more often.   You may become short of breath easily.   Your belly button may stick out.   A yellow discharge may leak from your breasts called colostrum.   You may have a bloody mucus discharge. This usually occurs a few days to a week before labor begins.  HOME CARE INSTRUCTIONS   Keep your caregiver's appointments. Follow your caregiver's instructions regarding medication use, exercise, and diet.   During pregnancy, you are providing food for you and your baby. Continue to eat regular, well-balanced meals. Choose foods such as meat, fish, milk and other low fat dairy products, vegetables, fruits, and whole-grain breads and cereals. Your caregiver will tell you of the ideal weight gain.   A physical sexual relationship may be continued throughout pregnancy if there are no other problems such as early (premature) leaking of amniotic fluid from the membranes, vaginal bleeding, or belly (abdominal) pain.   Exercise regularly if there are no restrictions. Check with your caregiver if you are unsure of the safety of your exercises. Greater weight gain will occur in the last 2 trimesters of pregnancy. Exercising helps:   Control your weight.   Get you in shape for labor and delivery.   You lose weight after you deliver.   Rest a lot with legs elevated, or as needed for leg cramps or low back pain.   Wear a good support or jogging bra for breast tenderness during pregnancy. This may help if worn during sleep. Pads or tissues may be used in the bra if you are leaking colostrum.   Do not use hot tubs, steam rooms, or saunas.   Wear your seat belt when driving. This protects you and your baby if you are in an accident.   Avoid raw meat, cat  litter boxes and soil used by cats. These carry germs that can cause birth defects in the baby.   It is easier to loose urine during pregnancy. Tightening up and strengthening the pelvic muscles will help with this problem. You can practice stopping your urination while you are going to the bathroom. These are the same muscles you need to strengthen. It is also the muscles you would use if you were trying to stop from passing gas. You can practice tightening these muscles up 10  times a set and repeating this about 3 times per day. Once you know what muscles to tighten up, do not perform these exercises during urination. It is more likely to cause an infection by backing up the urine.   Ask for help if you have financial, counseling or nutritional needs during pregnancy. Your caregiver will be able to offer counseling for these needs as well as refer you for other special needs.   Make a list of emergency phone numbers and have them available.   Plan on getting help from family or friends when you go home from the hospital.   Make a trial run to the hospital.   Take prenatal classes with the father to understand, practice and ask questions about the labor and delivery.   Prepare the baby's room/nursery.   Do not travel out of the city unless it is absolutely necessary and with the advice of your caregiver.   Wear only low or no heal shoes to have better balance and prevent falling.  MEDICATIONS AND DRUG USE IN PREGNANCY  Take prenatal vitamins as directed. The vitamin should contain 1 milligram of folic acid. Keep all vitamins out of reach of children. Only a couple vitamins or tablets containing iron may be fatal to a baby or young child when ingested.   Avoid use of all medications, including herbs, over-the-counter medications, not prescribed or suggested by your caregiver. Only take over-the-counter or prescription medicines for pain, discomfort, or fever as directed by your caregiver. Do  not use aspirin, ibuprofen (Motrin, Advil, Nuprin) or naproxen (Aleve) unless OK'd by your caregiver.   Let your caregiver also know about herbs you may be using.   Alcohol is related to a number of birth defects. This includes fetal alcohol syndrome. All alcohol, in any form, should be avoided completely. Smoking will cause low birth rate and premature babies.   Street/illegal drugs are very harmful to the baby. They are absolutely forbidden. A baby born to an addicted mother will be addicted at birth. The baby will go through the same withdrawal an adult does.  SEEK MEDICAL CARE IF: You have any concerns or worries during your pregnancy. It is better to call with your questions if you feel they cannot wait, rather than worry about them. DECISIONS ABOUT CIRCUMCISION You may or may not know the sex of your baby. If you know your baby is a boy, it may be time to think about circumcision. Circumcision is the removal of the foreskin of the penis. This is the skin that covers the sensitive end of the penis. There is no proven medical need for this. Often this decision is made on what is popular at the time or based upon religious beliefs and social issues. You can discuss these issues with your caregiver or pediatrician. SEEK IMMEDIATE MEDICAL CARE IF:   An unexplained oral temperature above 102 F (38.9 C) develops, or as your caregiver suggests.   You have leaking of fluid from the vagina (birth canal). If leaking membranes are suspected, take your temperature and tell your caregiver of this when you call.   There is vaginal spotting, bleeding or passing clots. Tell your caregiver of the amount and how many pads are used.   You develop a bad smelling vaginal discharge with a change in the color from clear to white.   You develop vomiting that lasts more than 24 hours.   You develop chills or fever.   You develop shortness of breath.  You develop burning on urination.   You loose more  than 2 pounds of weight or gain more than 2 pounds of weight or as suggested by your caregiver.   You notice sudden swelling of your face, hands, and feet or legs.   You develop belly (abdominal) pain. Round ligament discomfort is a common non-cancerous (benign) cause of abdominal pain in pregnancy. Your caregiver still must evaluate you.   You develop a severe headache that does not go away.   You develop visual problems, blurred or double vision.   If you have not felt your baby move for more than 1 hour. If you think the baby is not moving as much as usual, eat something with sugar in it and lie down on your left side for an hour. The baby should move at least 4 to 5 times per hour. Call right away if your baby moves less than that.   You fall, are in a car accident or any kind of trauma.   There is mental or physical violence at home.  Document Released: 09/17/2001 Document Revised: 09/12/2011 Document Reviewed: 03/22/2009 [facility] Patient Information 2012 [place], [place]. Breastfeeding BENEFITS OF BREASTFEEDING For the baby  The first milk (colostrum) helps the baby's digestive system function better.   There are antibodies from the mother in the milk that help the baby fight off infections.   The baby has a lower incidence of asthma, allergies, and SIDS (sudden infant death syndrome).   The nutrients in breast milk are better than formulas for the baby and helps the baby's brain grow better.   Babies who breastfeed have less gas, colic, and constipation.  For the mother  Breastfeeding helps develop a very special bond between mother and baby.   It is more convenient, always available at the correct temperature and cheaper than formula feeding.   It burns calories in the mother and helps with losing weight that was gained during pregnancy.   It makes the uterus contract back down to normal size faster and slows bleeding following delivery.   Breastfeeding mothers have a  lower risk of developing breast cancer.  NURSE FREQUENTLY  A healthy, full-term baby may breastfeed as often as every hour or space his or her feedings to every 3 hours.   How often to nurse will vary from baby to baby. Watch your baby for signs of hunger, not the clock.   Nurse as often as the baby requests, or when you feel the need to reduce the fullness of your breasts.   Awaken the baby if it has been 3 to 4 hours since the last feeding.   Frequent feeding will help the mother make more milk and will prevent problems like sore nipples and engorgement of the breasts.  BABY'S POSITION AT THE BREAST  Whether lying down or sitting, be sure that the baby's tummy is facing your tummy.   Support the breast with 4 fingers underneath the breast and the thumb above. Make sure your fingers are well away from the nipple and baby's mouth.   Stroke the baby's lips and cheek closest to the breast gently with your finger or nipple.   When the baby's mouth is open wide enough, place all of your nipple and as much of the dark area around the nipple as possible into your baby's mouth.   Pull the baby in close so the tip of the nose and the baby's cheeks touch the breast during the feeding.  FEEDINGS  The length of each feeding varies from baby to baby and from feeding to feeding.   The baby must suck about 2 to 3 minutes for your milk to get to him or her. This is called a "let down." For this reason, allow the baby to feed on each breast as long as he or she wants. Your baby will end the feeding when he or she has received the right balance of nutrients.   To break the suction, put your finger into the corner of the baby's mouth and slide it between his or her gums before removing your breast from his or her mouth. This will help prevent sore nipples.  REDUCING BREAST ENGORGEMENT  In the first week after your baby is born, you may experience signs of breast engorgement. When breasts are engorged,  they feel heavy, warm, full, and may be tender to the touch. You can reduce engorgement if you:   Nurse frequently, every 2 to 3 hours. Mothers who breastfeed early and often have fewer problems with engorgement.   Place light ice packs on your breasts between feedings. This reduces swelling. Wrap the ice packs in a lightweight towel to protect your skin.   Apply moist hot packs to your breast for 5 to 10 minutes before each feeding. This increases circulation and helps the milk flow.   Gently massage your breast before and during the feeding.   Make sure that the baby empties at least one breast at every feeding before switching sides.   Use a breast pump to empty the breasts if your baby is sleepy or not nursing well. You may also want to pump if you are returning to work or or you feel you are getting engorged.   Avoid bottle feeds, pacifiers or supplemental feedings of water or juice in place of breastfeeding.   Be sure the baby is latched on and positioned properly while breastfeeding.   Prevent fatigue, stress, and anemia.   Wear a supportive bra, avoiding underwire styles.   Eat a balanced diet with enough fluids.  If you follow these suggestions, your engorgement should improve in 24 to 48 hours. If you are still experiencing difficulty, call your lactation consultant or caregiver. IS MY BABY GETTING ENOUGH MILK? Sometimes, mothers worry about whether their babies are getting enough milk. You can be assured that your baby is getting enough milk if:  The baby is actively sucking and you hear swallowing.   The baby nurses at least 8 to 12 times in a 24 hour time period. Nurse your baby until he or she unlatches or falls asleep at the first breast (at least 10 to 20 minutes), then offer the second side.   The baby is wetting 5 to 6 disposable diapers (6 to 8 cloth diapers) in a 24 hour period by 76 to 18 days of age.   The baby is having at least 2 to 3 stools every 24 hours for  the first few months. Breast milk is all the food your baby needs. It is not necessary for your baby to have water or formula. In fact, to help your breasts make more milk, it is best not to give your baby supplemental feedings during the early weeks.   The stool should be soft and yellow.   The baby should gain 4 to 7 ounces per week after he is 81 days old.  TAKE CARE OF YOURSELF Take care of your breasts by:  Bathing or showering daily.  Avoiding the use of soaps on your nipples.   Start feedings on your left breast at one feeding and on your right breast at the next feeding.   You will notice an increase in your milk supply 2 to 5 days after delivery. You may feel some discomfort from engorgement, which makes your breasts very firm and often tender. Engorgement "peaks" out within 24 to 48 hours. In the meantime, apply warm moist towels to your breasts for 5 to 10 minutes before feeding. Gentle massage and expression of some milk before feeding will soften your breasts, making it easier for your baby to latch on. Wear a well fitting nursing bra and air dry your nipples for 10 to 15 minutes after each feeding.   Only use cotton bra pads.   Only use pure lanolin on your nipples after nursing. You do not need to wash it off before nursing.  Take care of yourself by:   Eating well-balanced meals and nutritious snacks.   Drinking milk, fruit juice, and water to satisfy your thirst (about 8 glasses a day).   Getting plenty of rest.   Increasing calcium in your diet (1200 mg a day).   Avoiding foods that you notice affect the baby in a bad way.  SEEK MEDICAL CARE IF:   You have any questions or difficulty with breastfeeding.   You need help.   You have a hard, red, sore area on your breast, accompanied by a fever of 100.5 F (38.1 C) or more.   Your baby is too sleepy to eat well or is having trouble sleeping.   Your baby is wetting less than 6 diapers per day, by 83 days of age.     Your baby's skin or white part of his or her eyes is more yellow than it was in the hospital.   You feel depressed.  Document Released: 09/23/2005 Document Revised: 09/12/2011 Document Reviewed: 05/08/2009 [facility] Patient Information 2012 [place], [place].

## 2011-12-18 ENCOUNTER — Encounter: Payer: Self-pay | Admitting: Family Medicine

## 2011-12-18 LAB — RPR

## 2011-12-18 LAB — CBC
HCT: 35.9 % — ABNORMAL LOW (ref 36.0–46.0)
Hemoglobin: 11.9 g/dL — ABNORMAL LOW (ref 12.0–15.0)
MCH: 30.3 pg (ref 26.0–34.0)
MCHC: 33.1 g/dL (ref 30.0–36.0)
MCV: 91.3 fL (ref 78.0–100.0)
Platelets: 223 10*3/uL (ref 150–400)
RBC: 3.93 MIL/uL (ref 3.87–5.11)
RDW: 13.9 % (ref 11.5–15.5)
WBC: 12.8 10*3/uL — ABNORMAL HIGH (ref 4.0–10.5)

## 2011-12-18 LAB — HIV ANTIBODY (ROUTINE TESTING W REFLEX): HIV: NONREACTIVE

## 2011-12-18 LAB — GLUCOSE TOLERANCE, 1 HOUR: Glucose, 1 Hour GTT: 122 mg/dL (ref 70–140)

## 2012-01-01 ENCOUNTER — Ambulatory Visit (INDEPENDENT_AMBULATORY_CARE_PROVIDER_SITE_OTHER): Payer: 59 | Admitting: Obstetrics & Gynecology

## 2012-01-01 VITALS — BP 116/76 | Wt 135.0 lb

## 2012-01-01 DIAGNOSIS — Z3400 Encounter for supervision of normal first pregnancy, unspecified trimester: Secondary | ICD-10-CM

## 2012-01-01 DIAGNOSIS — A630 Anogenital (venereal) warts: Secondary | ICD-10-CM

## 2012-01-01 NOTE — Patient Instructions (Signed)
Breastfeeding BENEFITS OF BREASTFEEDING For the baby  The first milk (colostrum) helps the baby's digestive system function better.   There are antibodies from the mother in the milk that help the baby fight off infections.   The baby has a lower incidence of asthma, allergies, and SIDS (sudden infant death syndrome).   The nutrients in breast milk are better than formulas for the baby and helps the baby's brain grow better.   Babies who breastfeed have less gas, colic, and constipation.  For the mother  Breastfeeding helps develop a very special bond between mother and baby.   It is more convenient, always available at the correct temperature and cheaper than formula feeding.   It burns calories in the mother and helps with losing weight that was gained during pregnancy.   It makes the uterus contract back down to normal size faster and slows bleeding following delivery.   Breastfeeding mothers have a lower risk of developing breast cancer.  NURSE FREQUENTLY  A healthy, full-term baby may breastfeed as often as every hour or space his or her feedings to every 3 hours.   How often to nurse will vary from baby to baby. Watch your baby for signs of hunger, not the clock.   Nurse as often as the baby requests, or when you feel the need to reduce the fullness of your breasts.   Awaken the baby if it has been 3 to 4 hours since the last feeding.   Frequent feeding will help the mother make more milk and will prevent problems like sore nipples and engorgement of the breasts.  BABY'S POSITION AT THE BREAST  Whether lying down or sitting, be sure that the baby's tummy is facing your tummy.   Support the breast with 4 fingers underneath the breast and the thumb above. Make sure your fingers are well away from the nipple and baby's mouth.   Stroke the baby's lips and cheek closest to the breast gently with your finger or nipple.   When the baby's mouth is open wide enough, place all  of your nipple and as much of the dark area around the nipple as possible into your baby's mouth.   Pull the baby in close so the tip of the nose and the baby's cheeks touch the breast during the feeding.  FEEDINGS  The length of each feeding varies from baby to baby and from feeding to feeding.   The baby must suck about 2 to 3 minutes for your milk to get to him or her. This is called a "let down." For this reason, allow the baby to feed on each breast as long as he or she wants. Your baby will end the feeding when he or she has received the right balance of nutrients.   To break the suction, put your finger into the corner of the baby's mouth and slide it between his or her gums before removing your breast from his or her mouth. This will help prevent sore nipples.  REDUCING BREAST ENGORGEMENT  In the first week after your baby is born, you may experience signs of breast engorgement. When breasts are engorged, they feel heavy, warm, full, and may be tender to the touch. You can reduce engorgement if you:   Nurse frequently, every 2 to 3 hours. Mothers who breastfeed early and often have fewer problems with engorgement.   Place light ice packs on your breasts between feedings. This reduces swelling. Wrap the ice packs in a   lightweight towel to protect your skin.   Apply moist hot packs to your breast for 5 to 10 minutes before each feeding. This increases circulation and helps the milk flow.   Gently massage your breast before and during the feeding.   Make sure that the baby empties at least one breast at every feeding before switching sides.   Use a breast pump to empty the breasts if your baby is sleepy or not nursing well. You may also want to pump if you are returning to work or or you feel you are getting engorged.   Avoid bottle feeds, pacifiers or supplemental feedings of water or juice in place of breastfeeding.   Be sure the baby is latched on and positioned properly while  breastfeeding.   Prevent fatigue, stress, and anemia.   Wear a supportive bra, avoiding underwire styles.   Eat a balanced diet with enough fluids.  If you follow these suggestions, your engorgement should improve in 24 to 48 hours. If you are still experiencing difficulty, call your lactation consultant or caregiver. IS MY BABY GETTING ENOUGH MILK? Sometimes, mothers worry about whether their babies are getting enough milk. You can be assured that your baby is getting enough milk if:  The baby is actively sucking and you hear swallowing.   The baby nurses at least 8 to 12 times in a 24 hour time period. Nurse your baby until he or she unlatches or falls asleep at the first breast (at least 10 to 20 minutes), then offer the second side.   The baby is wetting 5 to 6 disposable diapers (6 to 8 cloth diapers) in a 24 hour period by 5 to 6 days of age.   The baby is having at least 2 to 3 stools every 24 hours for the first few months. Breast milk is all the food your baby needs. It is not necessary for your baby to have water or formula. In fact, to help your breasts make more milk, it is best not to give your baby supplemental feedings during the early weeks.   The stool should be soft and yellow.   The baby should gain 4 to 7 ounces per week after he is 4 days old.  TAKE CARE OF YOURSELF Take care of your breasts by:  Bathing or showering daily.   Avoiding the use of soaps on your nipples.   Start feedings on your left breast at one feeding and on your right breast at the next feeding.   You will notice an increase in your milk supply 2 to 5 days after delivery. You may feel some discomfort from engorgement, which makes your breasts very firm and often tender. Engorgement "peaks" out within 24 to 48 hours. In the meantime, apply warm moist towels to your breasts for 5 to 10 minutes before feeding. Gentle massage and expression of some milk before feeding will soften your breasts, making  it easier for your baby to latch on. Wear a well fitting nursing bra and air dry your nipples for 10 to 15 minutes after each feeding.   Only use cotton bra pads.   Only use pure lanolin on your nipples after nursing. You do not need to wash it off before nursing.  Take care of yourself by:   Eating well-balanced meals and nutritious snacks.   Drinking milk, fruit juice, and water to satisfy your thirst (about 8 glasses a day).   Getting plenty of rest.   Increasing calcium in   your diet (1200 mg a day).   Avoiding foods that you notice affect the baby in a bad way.  SEEK MEDICAL CARE IF:   You have any questions or difficulty with breastfeeding.   You need help.   You have a hard, red, sore area on your breast, accompanied by a fever of 100.5 F (38.1 C) or more.   Your baby is too sleepy to eat well or is having trouble sleeping.   Your baby is wetting less than 6 diapers per day, by 5 days of age.   Your baby's skin or white part of his or her eyes is more yellow than it was in the hospital.   You feel depressed.  Document Released: 09/23/2005 Document Revised: 09/12/2011 Document Reviewed: 05/08/2009 [employer] Patient Information 2012 [employer]. 

## 2012-01-01 NOTE — Progress Notes (Signed)
Patient is here for routine prenatal, she is doing well.  She would like genital warts treated again, much smaller but still there.

## 2012-01-01 NOTE — Progress Notes (Signed)
Patient identified, informed consent signed and copy in chart, time out performed.   Areas of typical appearing genital warts noted at posterior fourchette.  Surrounding area coated with water based lubricant and TCA applied until warts had white appearance.  Patient tolerated the procedure well.   Post procedure instructions given and patient told to wash area in thirty minutes. Return in 2 weeks for next treatment.  No other complaints or concerns.  Fetal movement and labor precautions reviewed.

## 2012-01-16 ENCOUNTER — Ambulatory Visit (INDEPENDENT_AMBULATORY_CARE_PROVIDER_SITE_OTHER): Payer: 59 | Admitting: Obstetrics & Gynecology

## 2012-01-16 VITALS — BP 125/76 | Wt 140.0 lb

## 2012-01-16 DIAGNOSIS — Z3400 Encounter for supervision of normal first pregnancy, unspecified trimester: Secondary | ICD-10-CM

## 2012-01-16 DIAGNOSIS — A630 Anogenital (venereal) warts: Secondary | ICD-10-CM

## 2012-01-16 NOTE — Patient Instructions (Signed)
Pregnancy - Third Trimester The third trimester of pregnancy (the last 3 months) is a period of the most rapid growth for you and your baby. The baby approaches a length of 20 inches and a weight of 6 to 10 pounds. The baby is adding on fat and getting ready for life outside your body. While inside, babies have periods of sleeping and waking, suck their thumbs, and hiccups. You can often feel small contractions of the uterus. This is false labor. It is also called Braxton-Hicks contractions. This is like a practice for labor. The usual problems in this stage of pregnancy include more difficulty breathing, swelling of the hands and feet from water retention, and having to urinate more often because of the uterus and baby pressing on your bladder.  PRENATAL EXAMS  Blood work may continue to be done during prenatal exams. These tests are done to check on your health and the probable health of your baby. Blood work is used to follow your blood levels (hemoglobin). Anemia (low hemoglobin) is common during pregnancy. Iron and vitamins are given to help prevent this. You may also continue to be checked for diabetes. Some of the past blood tests may be done again.   The size of the uterus is measured during each visit. This makes sure your baby is growing properly according to your pregnancy dates.   Your blood pressure is checked every prenatal visit. This is to make sure you are not getting toxemia.   Your urine is checked every prenatal visit for infection, diabetes and protein.   Your weight is checked at each visit. This is done to make sure gains are happening at the suggested rate and that you and your baby are growing normally.   Sometimes, an ultrasound is performed to confirm the position and the proper growth and development of the baby. This is a test done that bounces harmless sound waves off the baby so your caregiver can more accurately determine due dates.   Discuss the type of pain  medication and anesthesia you will have during your labor and delivery.   Discuss the possibility and anesthesia if a Cesarean Section might be necessary.   Inform your caregiver if there is any mental or physical violence at home.  Sometimes, a specialized non-stress test, contraction stress test and biophysical profile are done to make sure the baby is not having a problem. Checking the amniotic fluid surrounding the baby is called an amniocentesis. The amniotic fluid is removed by sticking a needle into the belly (abdomen). This is sometimes done near the end of pregnancy if an early delivery is required. In this case, it is done to help make sure the baby's lungs are mature enough for the baby to live outside of the womb. If the lungs are not mature and it is unsafe to deliver the baby, an injection of cortisone medication is given to the mother 1 to 2 days before the delivery. This helps the baby's lungs mature and makes it safer to deliver the baby. CHANGES OCCURING IN THE THIRD TRIMESTER OF PREGNANCY Your body goes through many changes during pregnancy. They vary from person to person. Talk to your caregiver about changes you notice and are concerned about.  During the last trimester, you have probably had an increase in your appetite. It is normal to have cravings for certain foods. This varies from person to person and pregnancy to pregnancy.   You may begin to get stretch marks on your hips,   abdomen, and breasts. These are normal changes in the body during pregnancy. There are no exercises or medications to take which prevent this change.   Constipation may be treated with a stool softener or adding bulk to your diet. Drinking lots of fluids, fiber in vegetables, fruits, and whole grains are helpful.   Exercising is also helpful. If you have been very active up until your pregnancy, most of these activities can be continued during your pregnancy. If you have been less active, it is helpful  to start an exercise program such as walking. Consult your caregiver before starting exercise programs.   Avoid all smoking, alcohol, un-prescribed drugs, herbs and "street drugs" during your pregnancy. These chemicals affect the formation and growth of the baby. Avoid chemicals throughout the pregnancy to ensure the delivery of a healthy infant.   Backache, varicose veins and hemorrhoids may develop or get worse.   You will tire more easily in the third trimester, which is normal.   The baby's movements may be stronger and more often.   You may become short of breath easily.   Your belly button may stick out.   A yellow discharge may leak from your breasts called colostrum.   You may have a bloody mucus discharge. This usually occurs a few days to a week before labor begins.  HOME CARE INSTRUCTIONS   Keep your caregiver's appointments. Follow your caregiver's instructions regarding medication use, exercise, and diet.   During pregnancy, you are providing food for you and your baby. Continue to eat regular, well-balanced meals. Choose foods such as meat, fish, milk and other low fat dairy products, vegetables, fruits, and whole-grain breads and cereals. Your caregiver will tell you of the ideal weight gain.   A physical sexual relationship may be continued throughout pregnancy if there are no other problems such as early (premature) leaking of amniotic fluid from the membranes, vaginal bleeding, or belly (abdominal) pain.   Exercise regularly if there are no restrictions. Check with your caregiver if you are unsure of the safety of your exercises. Greater weight gain will occur in the last 2 trimesters of pregnancy. Exercising helps:   Control your weight.   Get you in shape for labor and delivery.   You lose weight after you deliver.   Rest a lot with legs elevated, or as needed for leg cramps or low back pain.   Wear a good support or jogging bra for breast tenderness during  pregnancy. This may help if worn during sleep. Pads or tissues may be used in the bra if you are leaking colostrum.   Do not use hot tubs, steam rooms, or saunas.   Wear your seat belt when driving. This protects you and your baby if you are in an accident.   Avoid raw meat, cat litter boxes and soil used by cats. These carry germs that can cause birth defects in the baby.   It is easier to loose urine during pregnancy. Tightening up and strengthening the pelvic muscles will help with this problem. You can practice stopping your urination while you are going to the bathroom. These are the same muscles you need to strengthen. It is also the muscles you would use if you were trying to stop from passing gas. You can practice tightening these muscles up 10 times a set and repeating this about 3 times per day. Once you know what muscles to tighten up, do not perform these exercises during urination. It is more likely   to cause an infection by backing up the urine.   Ask for help if you have financial, counseling or nutritional needs during pregnancy. Your caregiver will be able to offer counseling for these needs as well as refer you for other special needs.   Make a list of emergency phone numbers and have them available.   Plan on getting help from family or friends when you go home from the hospital.   Make a trial run to the hospital.   Take prenatal classes with the father to understand, practice and ask questions about the labor and delivery.   Prepare the baby's room/nursery.   Do not travel out of the city unless it is absolutely necessary and with the advice of your caregiver.   Wear only low or no heal shoes to have better balance and prevent falling.  MEDICATIONS AND DRUG USE IN PREGNANCY  Take prenatal vitamins as directed. The vitamin should contain 1 milligram of folic acid. Keep all vitamins out of reach of children. Only a couple vitamins or tablets containing iron may be fatal  to a baby or young child when ingested.   Avoid use of all medications, including herbs, over-the-counter medications, not prescribed or suggested by your caregiver. Only take over-the-counter or prescription medicines for pain, discomfort, or fever as directed by your caregiver. Do not use aspirin, ibuprofen (Motrin, Advil, Nuprin) or naproxen (Aleve) unless OK'd by your caregiver.   Let your caregiver also know about herbs you may be using.   Alcohol is related to a number of birth defects. This includes fetal alcohol syndrome. All alcohol, in any form, should be avoided completely. Smoking will cause low birth rate and premature babies.   Street/illegal drugs are very harmful to the baby. They are absolutely forbidden. A baby born to an addicted mother will be addicted at birth. The baby will go through the same withdrawal an adult does.  SEEK MEDICAL CARE IF: You have any concerns or worries during your pregnancy. It is better to call with your questions if you feel they cannot wait, rather than worry about them. DECISIONS ABOUT CIRCUMCISION You may or may not know the sex of your baby. If you know your baby is a boy, it may be time to think about circumcision. Circumcision is the removal of the foreskin of the penis. This is the skin that covers the sensitive end of the penis. There is no proven medical need for this. Often this decision is made on what is popular at the time or based upon religious beliefs and social issues. You can discuss these issues with your caregiver or pediatrician. SEEK IMMEDIATE MEDICAL CARE IF:   An unexplained oral temperature above 102 F (38.9 C) develops, or as your caregiver suggests.   You have leaking of fluid from the vagina (birth canal). If leaking membranes are suspected, take your temperature and tell your caregiver of this when you call.   There is vaginal spotting, bleeding or passing clots. Tell your caregiver of the amount and how many pads are  used.   You develop a bad smelling vaginal discharge with a change in the color from clear to white.   You develop vomiting that lasts more than 24 hours.   You develop chills or fever.   You develop shortness of breath.   You develop burning on urination.   You loose more than 2 pounds of weight or gain more than 2 pounds of weight or as suggested by your   caregiver.   You notice sudden swelling of your face, hands, and feet or legs.   You develop belly (abdominal) pain. Round ligament discomfort is a common non-cancerous (benign) cause of abdominal pain in pregnancy. Your caregiver still must evaluate you.   You develop a severe headache that does not go away.   You develop visual problems, blurred or double vision.   If you have not felt your baby move for more than 1 hour. If you think the baby is not moving as much as usual, eat something with sugar in it and lie down on your left side for an hour. The baby should move at least 4 to 5 times per hour. Call right away if your baby moves less than that.   You fall, are in a car accident or any kind of trauma.   There is mental or physical violence at home.  Document Released: 09/17/2001 Document Revised: 09/12/2011 Document Reviewed: 03/22/2009 [employer] Patient Information 2012 [employer]. 

## 2012-01-16 NOTE — Progress Notes (Signed)
Patient reports that the genital wart disappeared after her second TCA therapy two weeks ago.  No other complaints or concerns.  Fetal movement and labor precautions reviewed. Return to clinic in 2 weeks.

## 2012-01-30 ENCOUNTER — Ambulatory Visit (INDEPENDENT_AMBULATORY_CARE_PROVIDER_SITE_OTHER): Payer: 59 | Admitting: Family Medicine

## 2012-01-30 VITALS — BP 135/84 | Wt 144.0 lb

## 2012-01-30 DIAGNOSIS — O368190 Decreased fetal movements, unspecified trimester, not applicable or unspecified: Secondary | ICD-10-CM

## 2012-01-30 DIAGNOSIS — Z3480 Encounter for supervision of other normal pregnancy, unspecified trimester: Secondary | ICD-10-CM

## 2012-01-30 DIAGNOSIS — O365990 Maternal care for other known or suspected poor fetal growth, unspecified trimester, not applicable or unspecified: Secondary | ICD-10-CM

## 2012-01-30 NOTE — Progress Notes (Signed)
NST reviewed and reactive.  

## 2012-01-30 NOTE — Patient Instructions (Signed)
Preeclampsia and Eclampsia Preeclampsia is a condition of high blood pressure during pregnancy. It can happen at 20 weeks or later in pregnancy. If high blood pressure occurs in the second half of pregnancy with no other symptoms, it is called gestational hypertension and goes away after the baby is born. If any of the symptoms listed below develop with gestational hypertension, it is then called preeclampsia. Eclampsia (convulsions) may follow preeclampsia. This is one of the reasons for regular prenatal checkups. Early diagnosis and treatment are very important to prevent eclampsia. CAUSES  There is no known cause of preeclampsia/eclampsia in pregnancy. There are several known conditions that may put the pregnant woman at risk, such as:  The first pregnancy.   Having preeclampsia in a past pregnancy.   Having lasting (chronic) high blood pressure.   Having multiples (twins, triplets).   Being age 35 or older.   African American ethnic background.   Having kidney disease or diabetes.   Medical conditions such as lupus or blood diseases.   Being overweight (obese).  SYMPTOMS   High blood pressure.   Headaches.   Sudden weight gain.   Swelling of hands, face, legs, and feet.   Protein in the urine.   Feeling sick to your stomach (nauseous) and throwing up (vomiting).   Vision problems (blurred or double vision).   Numbness in the face, arms, legs, and feet.   Dizziness.   Slurred speech.   Preeclampsia can cause growth retardation in the fetus.   Separation (abruption) of the placenta.   Not enough fluid in the amniotic sac (oligohydramnios).   Sensitivity to bright lights.   Belly (abdominal) pain.  DIAGNOSIS  If protein is found in the urine in the second half of pregnancy, this is considered preeclampsia. Other symptoms mentioned above may also be present. TREATMENT  It is necessary to treat this.  Your caregiver may prescribe bed rest early in this  condition. Plenty of rest and salt restriction may be all that is needed.   Medicines may be necessary to lower blood pressure if the condition does not respond to more conservative measures.   In more severe cases, hospitalization may be needed:   For treatment of blood pressure.   To control fluid retention.   To monitor the baby to see if the condition is causing harm to the baby.   Hospitalization is the best way to treat the first sign of preeclampsia. This is so the mother and baby can be watched closely and blood tests can be done effectively and correctly.   If the condition becomes severe, it may be necessary to induce labor or to remove the infant by surgical means (cesarean section). The best cure for preeclampsia/eclampsia is to deliver the baby.  Preeclampsia and eclampsia involve risks to mother and infant. Your caregiver will discuss these risks with you. Together, you can work out the best possible approach to your problems. Make sure you keep your prenatal visits as scheduled. Not keeping appointments could result in a chronic or permanent injury, pain, disability to you, and death or injury to you or your unborn baby. If there is any problem keeping the appointment, you must call to reschedule. HOME CARE INSTRUCTIONS   Keep your prenatal appointments and tests as scheduled.   Tell your caregiver if you have any of the above risk factors.   Get plenty of rest and sleep.   Eat a balanced diet that is low in salt, and do not add salt   to your food.   Avoid stressful situations.   Only take over-the-counter and prescriptions medicines for pain, discomfort, or fever as directed by your caregiver.  SEEK IMMEDIATE MEDICAL CARE IF:   You develop severe swelling anywhere in the body. This usually occurs in the legs.   You gain 5 lb/2.3 kg or more in a week.   You develop a severe headache, dizziness, problems with your vision, or confusion.   You have abdominal pain,  nausea, or vomiting.   You have a seizure.   You have trouble moving any part of your body, or you develop numbness or problems speaking.   You have bruising or abnormal bleeding from anywhere in the body.   You develop a stiff neck.   You pass out.  MAKE SURE YOU:   Understand these instructions.   Will watch your condition.   Will get help right away if you are not doing well or get worse.  Document Released: 09/20/2000 Document Revised: 09/12/2011 Document Reviewed: 05/06/2008 [employer] Patient Information 2012 [employer]. Contraception Choices Contraception (birth control) is the use of any methods or devices to prevent pregnancy. Below are some methods to help avoid pregnancy. HORMONAL METHODS   Contraceptive implant. This is a thin, plastic tube containing progesterone hormone. It does not contain estrogen hormone. Your caregiver inserts the tube in the inner part of the upper arm. The tube can remain in place for up to 3 years. After 3 years, the implant must be removed. The implant prevents the ovaries from releasing an egg (ovulation), thickens the cervical mucus which prevents sperm from entering the uterus, and thins the lining of the inside of the uterus.   Progesterone-only injections. These injections are given every 3 months by your caregiver to prevent pregnancy. This synthetic progesterone hormone stops the ovaries from releasing eggs. It also thickens cervical mucus and changes the uterine lining. This makes it harder for sperm to survive in the uterus.   Birth control pills. These pills contain estrogen and progesterone hormone. They work by stopping the egg from forming in the ovary (ovulation). Birth control pills are prescribed by a caregiver.Birth control pills can also be used to treat heavy periods.   Minipill. This type of birth control pill contains only the progesterone hormone. They are taken every day of each month and must be prescribed by your  caregiver.   Birth control patch. The patch contains hormones similar to those in birth control pills. It must be changed once a week and is prescribed by a caregiver.   Vaginal ring. The ring contains hormones similar to those in birth control pills. It is left in the vagina for 3 weeks, removed for 1 week, and then a new one is put back in place. The patient must be comfortable inserting and removing the ring from the vagina.A caregiver's prescription is necessary.   Emergency contraception. Emergency contraceptives prevent pregnancy after unprotected sexual intercourse. This pill can be taken right after sex or up to 5 days after unprotected sex. It is most effective the sooner you take the pills after having sexual intercourse. Emergency contraceptive pills are available without a prescription. Check with your pharmacist. Do not use emergency contraception as your only form of birth control.  BARRIER METHODS   Female condom. This is a thin sheath (latex or rubber) that is worn over the penis during sexual intercourse. It can be used with spermicide to increase effectiveness.   Female condom. This is a soft, loose-fitting   sheath that is put into the vagina before sexual intercourse.   Diaphragm. This is a soft, latex, dome-shaped barrier that must be fitted by a caregiver. It is inserted into the vagina, along with a spermicidal jelly. It is inserted before intercourse. The diaphragm should be left in the vagina for 6 to 8 hours after intercourse.   Cervical cap. This is a round, soft, latex or plastic cup that fits over the cervix and must be fitted by a caregiver. The cap can be left in place for up to 48 hours after intercourse.   Sponge. This is a soft, circular piece of polyurethane foam. The sponge has spermicide in it. It is inserted into the vagina after wetting it and before sexual intercourse.   Spermicides. These are chemicals that kill or block sperm from entering the cervix and  uterus. They come in the form of creams, jellies, suppositories, foam, or tablets. They do not require a prescription. They are inserted into the vagina with an applicator before having sexual intercourse. The process must be repeated every time you have sexual intercourse.  INTRAUTERINE CONTRACEPTION  Intrauterine device (IUD). This is a T-shaped device that is put in a woman's uterus during a menstrual period to prevent pregnancy. There are 2 types:   Copper IUD. This type of IUD is wrapped in copper wire and is placed inside the uterus. Copper makes the uterus and fallopian tubes produce a fluid that kills sperm. It can stay in place for 10 years.   Hormone IUD. This type of IUD contains the hormone progestin (synthetic progesterone). The hormone thickens the cervical mucus and prevents sperm from entering the uterus, and it also thins the uterine lining to prevent implantation of a fertilized egg. The hormone can weaken or kill the sperm that get into the uterus. It can stay in place for 5 years.  PERMANENT METHODS OF CONTRACEPTION  Female tubal ligation. This is when the woman's fallopian tubes are surgically sealed, tied, or blocked to prevent the egg from traveling to the uterus.   Female sterilization. This is when the female has the tubes that carry sperm tied off (vasectomy).This blocks sperm from entering the vagina during sexual intercourse. After the procedure, the man can still ejaculate fluid (semen).  NATURAL PLANNING METHODS  Natural family planning. This is not having sexual intercourse or using a barrier method (condom, diaphragm, cervical cap) on days the woman could become pregnant.   Calendar method. This is keeping track of the length of each menstrual cycle and identifying when you are fertile.   Ovulation method. This is avoiding sexual intercourse during ovulation.   Symptothermal method. This is avoiding sexual intercourse during ovulation, using a thermometer and  ovulation symptoms.   Post-ovulation method. This is timing sexual intercourse after you have ovulated.  Regardless of which type or method of contraception you choose, it is important that you use condoms to protect against the transmission of sexually transmitted diseases (STDs). Talk with your caregiver about which form of contraception is most appropriate for you. Document Released: 09/23/2005 Document Revised: 09/12/2011 Document Reviewed: 01/30/2011 [employer] Patient Information 2012 [employer]. Breastfeeding BENEFITS OF BREASTFEEDING For the baby  The first milk (colostrum) helps the baby's digestive system function better.   There are antibodies from the mother in the milk that help the baby fight off infections.   The baby has a lower incidence of asthma, allergies, and SIDS (sudden infant death syndrome).   The nutrients in breast milk are   better than formulas for the baby and helps the baby's brain grow better.   Babies who breastfeed have less gas, colic, and constipation.  For the mother  Breastfeeding helps develop a very special bond between mother and baby.   It is more convenient, always available at the correct temperature and cheaper than formula feeding.   It burns calories in the mother and helps with losing weight that was gained during pregnancy.   It makes the uterus contract back down to normal size faster and slows bleeding following delivery.   Breastfeeding mothers have a lower risk of developing breast cancer.  NURSE FREQUENTLY  A healthy, full-term baby may breastfeed as often as every hour or space his or her feedings to every 3 hours.   How often to nurse will vary from baby to baby. Watch your baby for signs of hunger, not the clock.   Nurse as often as the baby requests, or when you feel the need to reduce the fullness of your breasts.   Awaken the baby if it has been 3 to 4 hours since the last feeding.   Frequent feeding will help the  mother make more milk and will prevent problems like sore nipples and engorgement of the breasts.  BABY'S POSITION AT THE BREAST  Whether lying down or sitting, be sure that the baby's tummy is facing your tummy.   Support the breast with 4 fingers underneath the breast and the thumb above. Make sure your fingers are well away from the nipple and baby's mouth.   Stroke the baby's lips and cheek closest to the breast gently with your finger or nipple.   When the baby's mouth is open wide enough, place all of your nipple and as much of the dark area around the nipple as possible into your baby's mouth.   Pull the baby in close so the tip of the nose and the baby's cheeks touch the breast during the feeding.  FEEDINGS  The length of each feeding varies from baby to baby and from feeding to feeding.   The baby must suck about 2 to 3 minutes for your milk to get to him or her. This is called a "let down." For this reason, allow the baby to feed on each breast as long as he or she wants. Your baby will end the feeding when he or she has received the right balance of nutrients.   To break the suction, put your finger into the corner of the baby's mouth and slide it between his or her gums before removing your breast from his or her mouth. This will help prevent sore nipples.  REDUCING BREAST ENGORGEMENT  In the first week after your baby is born, you may experience signs of breast engorgement. When breasts are engorged, they feel heavy, warm, full, and may be tender to the touch. You can reduce engorgement if you:   Nurse frequently, every 2 to 3 hours. Mothers who breastfeed early and often have fewer problems with engorgement.   Place light ice packs on your breasts between feedings. This reduces swelling. Wrap the ice packs in a lightweight towel to protect your skin.   Apply moist hot packs to your breast for 5 to 10 minutes before each feeding. This increases circulation and helps the milk  flow.   Gently massage your breast before and during the feeding.   Make sure that the baby empties at least one breast at every feeding before switching sides.     Use a breast pump to empty the breasts if your baby is sleepy or not nursing well. You may also want to pump if you are returning to work or or you feel you are getting engorged.   Avoid bottle feeds, pacifiers or supplemental feedings of water or juice in place of breastfeeding.   Be sure the baby is latched on and positioned properly while breastfeeding.   Prevent fatigue, stress, and anemia.   Wear a supportive bra, avoiding underwire styles.   Eat a balanced diet with enough fluids.  If you follow these suggestions, your engorgement should improve in 24 to 48 hours. If you are still experiencing difficulty, call your lactation consultant or caregiver. IS MY BABY GETTING ENOUGH MILK? Sometimes, mothers worry about whether their babies are getting enough milk. You can be assured that your baby is getting enough milk if:  The baby is actively sucking and you hear swallowing.   The baby nurses at least 8 to 12 times in a 24 hour time period. Nurse your baby until he or she unlatches or falls asleep at the first breast (at least 10 to 20 minutes), then offer the second side.   The baby is wetting 5 to 6 disposable diapers (6 to 8 cloth diapers) in a 24 hour period by 5 to 6 days of age.   The baby is having at least 2 to 3 stools every 24 hours for the first few months. Breast milk is all the food your baby needs. It is not necessary for your baby to have water or formula. In fact, to help your breasts make more milk, it is best not to give your baby supplemental feedings during the early weeks.   The stool should be soft and yellow.   The baby should gain 4 to 7 ounces per week after he is 4 days old.  TAKE CARE OF YOURSELF Take care of your breasts by:  Bathing or showering daily.   Avoiding the use of soaps on your  nipples.   Start feedings on your left breast at one feeding and on your right breast at the next feeding.   You will notice an increase in your milk supply 2 to 5 days after delivery. You may feel some discomfort from engorgement, which makes your breasts very firm and often tender. Engorgement "peaks" out within 24 to 48 hours. In the meantime, apply warm moist towels to your breasts for 5 to 10 minutes before feeding. Gentle massage and expression of some milk before feeding will soften your breasts, making it easier for your baby to latch on. Wear a well fitting nursing bra and air dry your nipples for 10 to 15 minutes after each feeding.   Only use cotton bra pads.   Only use pure lanolin on your nipples after nursing. You do not need to wash it off before nursing.  Take care of yourself by:   Eating well-balanced meals and nutritious snacks.   Drinking milk, fruit juice, and water to satisfy your thirst (about 8 glasses a day).   Getting plenty of rest.   Increasing calcium in your diet (1200 mg a day).   Avoiding foods that you notice affect the baby in a bad way.  SEEK MEDICAL CARE IF:   You have any questions or difficulty with breastfeeding.   You need help.   You have a hard, red, sore area on your breast, accompanied by a fever of 100.5 F (38.1 C) or more.     Your baby is too sleepy to eat well or is having trouble sleeping.   Your baby is wetting less than 6 diapers per day, by 5 days of age.   Your baby's skin or white part of his or her eyes is more yellow than it was in the hospital.   You feel depressed.  Document Released: 09/23/2005 Document Revised: 09/12/2011 Document Reviewed: 05/08/2009 [employer] Patient Information 2012 [employer]. 

## 2012-01-30 NOTE — Progress Notes (Signed)
Decreased fetal movement, S<D NST today and U/S for growth ASAP PIH precautions reviewed

## 2012-01-31 ENCOUNTER — Ambulatory Visit (HOSPITAL_COMMUNITY)
Admission: RE | Admit: 2012-01-31 | Discharge: 2012-01-31 | Disposition: A | Discharge status: Home / Self Care | Payer: 59 | Source: Ambulatory Visit | Attending: Family Medicine | Admitting: Family Medicine

## 2012-01-31 DIAGNOSIS — R030 Elevated blood-pressure reading, without diagnosis of hypertension: Secondary | ICD-10-CM | POA: Insufficient documentation

## 2012-01-31 DIAGNOSIS — O99891 Other specified diseases and conditions complicating pregnancy: Secondary | ICD-10-CM | POA: Insufficient documentation

## 2012-01-31 DIAGNOSIS — O365990 Maternal care for other known or suspected poor fetal growth, unspecified trimester, not applicable or unspecified: Secondary | ICD-10-CM

## 2012-02-05 ENCOUNTER — Ambulatory Visit (INDEPENDENT_AMBULATORY_CARE_PROVIDER_SITE_OTHER): Payer: 59 | Admitting: Family Medicine

## 2012-02-05 VITALS — BP 136/93 | Wt 143.0 lb

## 2012-02-05 DIAGNOSIS — O26899 Other specified pregnancy related conditions, unspecified trimester: Secondary | ICD-10-CM | POA: Insufficient documentation

## 2012-02-05 DIAGNOSIS — O360990 Maternal care for other rhesus isoimmunization, unspecified trimester, not applicable or unspecified: Secondary | ICD-10-CM

## 2012-02-05 DIAGNOSIS — IMO0002 Reserved for concepts with insufficient information to code with codable children: Secondary | ICD-10-CM

## 2012-02-05 DIAGNOSIS — Z3480 Encounter for supervision of other normal pregnancy, unspecified trimester: Secondary | ICD-10-CM

## 2012-02-05 DIAGNOSIS — Z6791 Unspecified blood type, Rh negative: Secondary | ICD-10-CM

## 2012-02-05 LAB — CBC
HCT: 37.3 % (ref 36.0–46.0)
Hemoglobin: 12.6 g/dL (ref 12.0–15.0)
MCH: 30.5 pg (ref 26.0–34.0)
MCHC: 33.8 g/dL (ref 30.0–36.0)
MCV: 90.3 fL (ref 78.0–100.0)
Platelets: 190 10*3/uL (ref 150–400)
RBC: 4.13 MIL/uL (ref 3.87–5.11)
RDW: 13.7 % (ref 11.5–15.5)
WBC: 10.9 10*3/uL — ABNORMAL HIGH (ref 4.0–10.5)

## 2012-02-05 NOTE — Patient Instructions (Signed)
Preeclampsia and Eclampsia Preeclampsia is a condition of high blood pressure during pregnancy. It can happen at 20 weeks or later in pregnancy. If high blood pressure occurs in the second half of pregnancy with no other symptoms, it is called gestational hypertension and goes away after the baby is born. If any of the symptoms listed below develop with gestational hypertension, it is then called preeclampsia. Eclampsia (convulsions) may follow preeclampsia. This is one of the reasons for regular prenatal checkups. Early diagnosis and treatment are very important to prevent eclampsia. CAUSES  There is no known cause of preeclampsia/eclampsia in pregnancy. There are several known conditions that may put the pregnant woman at risk, such as:  The first pregnancy.   Having preeclampsia in a past pregnancy.   Having lasting (chronic) high blood pressure.   Having multiples (twins, triplets).   Being age 35 or older.   African American ethnic background.   Having kidney disease or diabetes.   Medical conditions such as lupus or blood diseases.   Being overweight (obese).  SYMPTOMS   High blood pressure.   Headaches.   Sudden weight gain.   Swelling of hands, face, legs, and feet.   Protein in the urine.   Feeling sick to your stomach (nauseous) and throwing up (vomiting).   Vision problems (blurred or double vision).   Numbness in the face, arms, legs, and feet.   Dizziness.   Slurred speech.   Preeclampsia can cause growth retardation in the fetus.   Separation (abruption) of the placenta.   Not enough fluid in the amniotic sac (oligohydramnios).   Sensitivity to bright lights.   Belly (abdominal) pain.  DIAGNOSIS  If protein is found in the urine in the second half of pregnancy, this is considered preeclampsia. Other symptoms mentioned above may also be present. TREATMENT  It is necessary to treat this.  Your caregiver may prescribe bed rest early in this  condition. Plenty of rest and salt restriction may be all that is needed.   Medicines may be necessary to lower blood pressure if the condition does not respond to more conservative measures.   In more severe cases, hospitalization may be needed:   For treatment of blood pressure.   To control fluid retention.   To monitor the baby to see if the condition is causing harm to the baby.   Hospitalization is the best way to treat the first sign of preeclampsia. This is so the mother and baby can be watched closely and blood tests can be done effectively and correctly.   If the condition becomes severe, it may be necessary to induce labor or to remove the infant by surgical means (cesarean section). The best cure for preeclampsia/eclampsia is to deliver the baby.  Preeclampsia and eclampsia involve risks to mother and infant. Your caregiver will discuss these risks with you. Together, you can work out the best possible approach to your problems. Make sure you keep your prenatal visits as scheduled. Not keeping appointments could result in a chronic or permanent injury, pain, disability to you, and death or injury to you or your unborn baby. If there is any problem keeping the appointment, you must call to reschedule. HOME CARE INSTRUCTIONS   Keep your prenatal appointments and tests as scheduled.   Tell your caregiver if you have any of the above risk factors.   Get plenty of rest and sleep.   Eat a balanced diet that is low in salt, and do not add salt   to your food.   Avoid stressful situations.   Only take over-the-counter and prescriptions medicines for pain, discomfort, or fever as directed by your caregiver.  SEEK IMMEDIATE MEDICAL CARE IF:   You develop severe swelling anywhere in the body. This usually occurs in the legs.   You gain 5 lb/2.3 kg or more in a week.   You develop a severe headache, dizziness, problems with your vision, or confusion.   You have abdominal pain,  nausea, or vomiting.   You have a seizure.   You have trouble moving any part of your body, or you develop numbness or problems speaking.   You have bruising or abnormal bleeding from anywhere in the body.   You develop a stiff neck.   You pass out.  MAKE SURE YOU:   Understand these instructions.   Will watch your condition.   Will get help right away if you are not doing well or get worse.  Document Released: 09/20/2000 Document Revised: 09/12/2011 Document Reviewed: 05/06/2008 [employer] Patient Information 2012 [employer]. Breastfeeding BENEFITS OF BREASTFEEDING For the baby  The first milk (colostrum) helps the baby's digestive system function better.   There are antibodies from the mother in the milk that help the baby fight off infections.   The baby has a lower incidence of asthma, allergies, and SIDS (sudden infant death syndrome).   The nutrients in breast milk are better than formulas for the baby and helps the baby's brain grow better.   Babies who breastfeed have less gas, colic, and constipation.  For the mother  Breastfeeding helps develop a very special bond between mother and baby.   It is more convenient, always available at the correct temperature and cheaper than formula feeding.   It burns calories in the mother and helps with losing weight that was gained during pregnancy.   It makes the uterus contract back down to normal size faster and slows bleeding following delivery.   Breastfeeding mothers have a lower risk of developing breast cancer.  NURSE FREQUENTLY  A healthy, full-term baby may breastfeed as often as every hour or space his or her feedings to every 3 hours.   How often to nurse will vary from baby to baby. Watch your baby for signs of hunger, not the clock.   Nurse as often as the baby requests, or when you feel the need to reduce the fullness of your breasts.   Awaken the baby if it has been 3 to 4 hours since the last  feeding.   Frequent feeding will help the mother make more milk and will prevent problems like sore nipples and engorgement of the breasts.  BABY'S POSITION AT THE BREAST  Whether lying down or sitting, be sure that the baby's tummy is facing your tummy.   Support the breast with 4 fingers underneath the breast and the thumb above. Make sure your fingers are well away from the nipple and baby's mouth.   Stroke the baby's lips and cheek closest to the breast gently with your finger or nipple.   When the baby's mouth is open wide enough, place all of your nipple and as much of the dark area around the nipple as possible into your baby's mouth.   Pull the baby in close so the tip of the nose and the baby's cheeks touch the breast during the feeding.  FEEDINGS  The length of each feeding varies from baby to baby and from feeding to feeding.   The   baby must suck about 2 to 3 minutes for your milk to get to him or her. This is called a "let down." For this reason, allow the baby to feed on each breast as long as he or she wants. Your baby will end the feeding when he or she has received the right balance of nutrients.   To break the suction, put your finger into the corner of the baby's mouth and slide it between his or her gums before removing your breast from his or her mouth. This will help prevent sore nipples.  REDUCING BREAST ENGORGEMENT  In the first week after your baby is born, you may experience signs of breast engorgement. When breasts are engorged, they feel heavy, warm, full, and may be tender to the touch. You can reduce engorgement if you:   Nurse frequently, every 2 to 3 hours. Mothers who breastfeed early and often have fewer problems with engorgement.   Place light ice packs on your breasts between feedings. This reduces swelling. Wrap the ice packs in a lightweight towel to protect your skin.   Apply moist hot packs to your breast for 5 to 10 minutes before each feeding.  This increases circulation and helps the milk flow.   Gently massage your breast before and during the feeding.   Make sure that the baby empties at least one breast at every feeding before switching sides.   Use a breast pump to empty the breasts if your baby is sleepy or not nursing well. You may also want to pump if you are returning to work or or you feel you are getting engorged.   Avoid bottle feeds, pacifiers or supplemental feedings of water or juice in place of breastfeeding.   Be sure the baby is latched on and positioned properly while breastfeeding.   Prevent fatigue, stress, and anemia.   Wear a supportive bra, avoiding underwire styles.   Eat a balanced diet with enough fluids.  If you follow these suggestions, your engorgement should improve in 24 to 48 hours. If you are still experiencing difficulty, call your lactation consultant or caregiver. IS MY BABY GETTING ENOUGH MILK? Sometimes, mothers worry about whether their babies are getting enough milk. You can be assured that your baby is getting enough milk if:  The baby is actively sucking and you hear swallowing.   The baby nurses at least 8 to 12 times in a 24 hour time period. Nurse your baby until he or she unlatches or falls asleep at the first breast (at least 10 to 20 minutes), then offer the second side.   The baby is wetting 5 to 6 disposable diapers (6 to 8 cloth diapers) in a 24 hour period by 5 to 6 days of age.   The baby is having at least 2 to 3 stools every 24 hours for the first few months. Breast milk is all the food your baby needs. It is not necessary for your baby to have water or formula. In fact, to help your breasts make more milk, it is best not to give your baby supplemental feedings during the early weeks.   The stool should be soft and yellow.   The baby should gain 4 to 7 ounces per week after he is 4 days old.  TAKE CARE OF YOURSELF Take care of your breasts by:  Bathing or showering  daily.   Avoiding the use of soaps on your nipples.   Start feedings on your left breast at   one feeding and on your right breast at the next feeding.   You will notice an increase in your milk supply 2 to 5 days after delivery. You may feel some discomfort from engorgement, which makes your breasts very firm and often tender. Engorgement "peaks" out within 24 to 48 hours. In the meantime, apply warm moist towels to your breasts for 5 to 10 minutes before feeding. Gentle massage and expression of some milk before feeding will soften your breasts, making it easier for your baby to latch on. Wear a well fitting nursing bra and air dry your nipples for 10 to 15 minutes after each feeding.   Only use cotton bra pads.   Only use pure lanolin on your nipples after nursing. You do not need to wash it off before nursing.  Take care of yourself by:   Eating well-balanced meals and nutritious snacks.   Drinking milk, fruit juice, and water to satisfy your thirst (about 8 glasses a day).   Getting plenty of rest.   Increasing calcium in your diet (1200 mg a day).   Avoiding foods that you notice affect the baby in a bad way.  SEEK MEDICAL CARE IF:   You have any questions or difficulty with breastfeeding.   You need help.   You have a hard, red, sore area on your breast, accompanied by a fever of 100.5 F (38.1 C) or more.   Your baby is too sleepy to eat well or is having trouble sleeping.   Your baby is wetting less than 6 diapers per day, by 5 days of age.   Your baby's skin or white part of his or her eyes is more yellow than it was in the hospital.   You feel depressed.  Document Released: 09/23/2005 Document Revised: 09/12/2011 Document Reviewed: 05/08/2009 [employer] Patient Information 2012 [employer]. 

## 2012-02-05 NOTE — Progress Notes (Signed)
Had u/s for growth last week.  EFW 5 lb 2 oz 44%, AFI 12, vtx. Check labs, PIH precautions.  Family h/o pre-eclampsia.

## 2012-02-06 LAB — COMPREHENSIVE METABOLIC PANEL
ALT: 8 U/L (ref 0–35)
AST: 17 U/L (ref 0–37)
Albumin: 3.5 g/dL (ref 3.5–5.2)
Alkaline Phosphatase: 188 U/L — ABNORMAL HIGH (ref 39–117)
BUN: 14 mg/dL (ref 6–23)
CO2: 21 mEq/L (ref 19–32)
Calcium: 8.6 mg/dL (ref 8.4–10.5)
Chloride: 105 mEq/L (ref 96–112)
Creat: 0.76 mg/dL (ref 0.50–1.10)
Glucose, Bld: 73 mg/dL (ref 70–99)
Potassium: 3.9 mEq/L (ref 3.5–5.3)
Sodium: 136 mEq/L (ref 135–145)
Total Bilirubin: 0.2 mg/dL — ABNORMAL LOW (ref 0.3–1.2)
Total Protein: 6 g/dL (ref 6.0–8.3)

## 2012-02-07 ENCOUNTER — Other Ambulatory Visit (INDEPENDENT_AMBULATORY_CARE_PROVIDER_SITE_OTHER): Payer: 59 | Admitting: *Deleted

## 2012-02-07 VITALS — BP 138/91 | HR 67

## 2012-02-07 DIAGNOSIS — O169 Unspecified maternal hypertension, unspecified trimester: Secondary | ICD-10-CM

## 2012-02-07 DIAGNOSIS — IMO0002 Reserved for concepts with insufficient information to code with codable children: Secondary | ICD-10-CM

## 2012-02-07 NOTE — Progress Notes (Signed)
Patient dropped off 24 hour urine and she is feeling ok.  No complaints of headaches blurry vision or upper quadrant pain.  She has been advised of signs and symptoms and will proceed to [facility] if any of these show.  We have moved her appointment from Thursday to Tuesday and she will begin twice weekly testing next week.

## 2012-02-08 ENCOUNTER — Encounter: Payer: Self-pay | Admitting: Obstetrics & Gynecology

## 2012-02-08 DIAGNOSIS — O169 Unspecified maternal hypertension, unspecified trimester: Secondary | ICD-10-CM | POA: Insufficient documentation

## 2012-02-08 LAB — PROTEIN, URINE, 24 HOUR
Protein, 24H Urine: 240 mg/d — ABNORMAL HIGH (ref 50–100)
Protein, Urine: 12 mg/dL

## 2012-02-08 LAB — CREATININE CLEARANCE, URINE, 24 HOUR
Creatinine Clearance: 138 mL/min — ABNORMAL HIGH (ref 75–115)
Creatinine, 24H Ur: 1514 mg/d (ref 700–1800)
Creatinine, Urine: 75.7 mg/dL
Creatinine: 0.76 mg/dL (ref 0.50–1.10)

## 2012-02-11 ENCOUNTER — Other Ambulatory Visit: Payer: Self-pay | Admitting: Obstetrics & Gynecology

## 2012-02-11 ENCOUNTER — Ambulatory Visit (INDEPENDENT_AMBULATORY_CARE_PROVIDER_SITE_OTHER): Payer: 59 | Admitting: Obstetrics & Gynecology

## 2012-02-11 VITALS — BP 133/92 | Wt 142.0 lb

## 2012-02-11 DIAGNOSIS — O169 Unspecified maternal hypertension, unspecified trimester: Secondary | ICD-10-CM

## 2012-02-11 DIAGNOSIS — O0990 Supervision of high risk pregnancy, unspecified, unspecified trimester: Secondary | ICD-10-CM

## 2012-02-11 LAB — OB RESULTS CONSOLE GBS: GBS: NEGATIVE

## 2012-02-11 NOTE — Patient Instructions (Signed)

## 2012-02-11 NOTE — Progress Notes (Signed)
No preeclampsia symptoms.  NST reactive today. Continue 2x/week testing.  24 hour urine protein was 240 mg.  GBS, GC. Chlam cultures done today, cervix 1/long.  No other complaints or concerns.  Preeclampsia, fetal movement and labor precautions reviewed.  Given persistent DBP >90, consider IOL in the 39th week.

## 2012-02-12 LAB — GC/CHLAMYDIA PROBE AMP, GENITAL
Chlamydia, DNA Probe: NEGATIVE
GC Probe Amp, Genital: NEGATIVE

## 2012-02-13 ENCOUNTER — Encounter: Payer: 59 | Admitting: Obstetrics and Gynecology

## 2012-02-14 ENCOUNTER — Ambulatory Visit (HOSPITAL_COMMUNITY): Payer: 59

## 2012-02-14 ENCOUNTER — Ambulatory Visit (HOSPITAL_COMMUNITY)
Admission: RE | Admit: 2012-02-14 | Discharge: 2012-02-14 | Disposition: A | Discharge status: Home / Self Care | Payer: 59 | Source: Ambulatory Visit | Attending: Obstetrics & Gynecology | Admitting: Obstetrics & Gynecology

## 2012-02-14 ENCOUNTER — Encounter: Payer: Self-pay | Admitting: Obstetrics & Gynecology

## 2012-02-14 VITALS — BP 123/79 | HR 60 | Wt 146.0 lb

## 2012-02-14 DIAGNOSIS — O169 Unspecified maternal hypertension, unspecified trimester: Secondary | ICD-10-CM

## 2012-02-14 DIAGNOSIS — O365990 Maternal care for other known or suspected poor fetal growth, unspecified trimester, not applicable or unspecified: Secondary | ICD-10-CM | POA: Insufficient documentation

## 2012-02-14 DIAGNOSIS — Z3689 Encounter for other specified antenatal screening: Secondary | ICD-10-CM | POA: Insufficient documentation

## 2012-02-14 LAB — CULTURE, BETA STREP (GROUP B ONLY)

## 2012-02-14 NOTE — Progress Notes (Signed)
Patient seen for follow up BPP.  See report in [facility].  Gloria Reinhold, MD 

## 2012-02-18 ENCOUNTER — Ambulatory Visit (INDEPENDENT_AMBULATORY_CARE_PROVIDER_SITE_OTHER): Payer: 59 | Admitting: Family Medicine

## 2012-02-18 ENCOUNTER — Encounter: Payer: 59 | Admitting: Family Medicine

## 2012-02-18 DIAGNOSIS — Z3480 Encounter for supervision of other normal pregnancy, unspecified trimester: Secondary | ICD-10-CM

## 2012-02-18 DIAGNOSIS — O10019 Pre-existing essential hypertension complicating pregnancy, unspecified trimester: Secondary | ICD-10-CM

## 2012-02-18 NOTE — Patient Instructions (Signed)
Preeclampsia and Eclampsia Preeclampsia is a condition of high blood pressure during pregnancy. It can happen at 20 weeks or later in pregnancy. If high blood pressure occurs in the second half of pregnancy with no other symptoms, it is called gestational hypertension and goes away after the baby is born. If any of the symptoms listed below develop with gestational hypertension, it is then called preeclampsia. Eclampsia (convulsions) may follow preeclampsia. This is one of the reasons for regular prenatal checkups. Early diagnosis and treatment are very important to prevent eclampsia. CAUSES  There is no known cause of preeclampsia/eclampsia in pregnancy. There are several known conditions that may put the pregnant woman at risk, such as:  The first pregnancy.   Having preeclampsia in a past pregnancy.   Having lasting (chronic) high blood pressure.   Having multiples (twins, triplets).   Being age 35 or older.   African American ethnic background.   Having kidney disease or diabetes.   Medical conditions such as lupus or blood diseases.   Being overweight (obese).  SYMPTOMS   High blood pressure.   Headaches.   Sudden weight gain.   Swelling of hands, face, legs, and feet.   Protein in the urine.   Feeling sick to your stomach (nauseous) and throwing up (vomiting).   Vision problems (blurred or double vision).   Numbness in the face, arms, legs, and feet.   Dizziness.   Slurred speech.   Preeclampsia can cause growth retardation in the fetus.   Separation (abruption) of the placenta.   Not enough fluid in the amniotic sac (oligohydramnios).   Sensitivity to bright lights.   Belly (abdominal) pain.  DIAGNOSIS  If protein is found in the urine in the second half of pregnancy, this is considered preeclampsia. Other symptoms mentioned above may also be present. TREATMENT  It is necessary to treat this.  Your caregiver may prescribe bed rest early in this  condition. Plenty of rest and salt restriction may be all that is needed.   Medicines may be necessary to lower blood pressure if the condition does not respond to more conservative measures.   In more severe cases, hospitalization may be needed:   For treatment of blood pressure.   To control fluid retention.   To monitor the baby to see if the condition is causing harm to the baby.   Hospitalization is the best way to treat the first sign of preeclampsia. This is so the mother and baby can be watched closely and blood tests can be done effectively and correctly.   If the condition becomes severe, it may be necessary to induce labor or to remove the infant by surgical means (cesarean section). The best cure for preeclampsia/eclampsia is to deliver the baby.  Preeclampsia and eclampsia involve risks to mother and infant. Your caregiver will discuss these risks with you. Together, you can work out the best possible approach to your problems. Make sure you keep your prenatal visits as scheduled. Not keeping appointments could result in a chronic or permanent injury, pain, disability to you, and death or injury to you or your unborn baby. If there is any problem keeping the appointment, you must call to reschedule. HOME CARE INSTRUCTIONS   Keep your prenatal appointments and tests as scheduled.   Tell your caregiver if you have any of the above risk factors.   Get plenty of rest and sleep.   Eat a balanced diet that is low in salt, and do not add salt   to your food.   Avoid stressful situations.   Only take over-the-counter and prescriptions medicines for pain, discomfort, or fever as directed by your caregiver.  SEEK IMMEDIATE MEDICAL CARE IF:   You develop severe swelling anywhere in the body. This usually occurs in the legs.   You gain 5 lb/2.3 kg or more in a week.   You develop a severe headache, dizziness, problems with your vision, or confusion.   You have abdominal pain,  nausea, or vomiting.   You have a seizure.   You have trouble moving any part of your body, or you develop numbness or problems speaking.   You have bruising or abnormal bleeding from anywhere in the body.   You develop a stiff neck.   You pass out.  MAKE SURE YOU:   Understand these instructions.   Will watch your condition.   Will get help right away if you are not doing well or get worse.  Document Released: 09/20/2000 Document Revised: 09/12/2011 Document Reviewed: 05/06/2008 [employer] Patient Information 2012 [employer]. Contraception Choices Contraception (birth control) is the use of any methods or devices to prevent pregnancy. Below are some methods to help avoid pregnancy. HORMONAL METHODS   Contraceptive implant. This is a thin, plastic tube containing progesterone hormone. It does not contain estrogen hormone. Your caregiver inserts the tube in the inner part of the upper arm. The tube can remain in place for up to 3 years. After 3 years, the implant must be removed. The implant prevents the ovaries from releasing an egg (ovulation), thickens the cervical mucus which prevents sperm from entering the uterus, and thins the lining of the inside of the uterus.   Progesterone-only injections. These injections are given every 3 months by your caregiver to prevent pregnancy. This synthetic progesterone hormone stops the ovaries from releasing eggs. It also thickens cervical mucus and changes the uterine lining. This makes it harder for sperm to survive in the uterus.   Birth control pills. These pills contain estrogen and progesterone hormone. They work by stopping the egg from forming in the ovary (ovulation). Birth control pills are prescribed by a caregiver.Birth control pills can also be used to treat heavy periods.   Minipill. This type of birth control pill contains only the progesterone hormone. They are taken every day of each month and must be prescribed by your  caregiver.   Birth control patch. The patch contains hormones similar to those in birth control pills. It must be changed once a week and is prescribed by a caregiver.   Vaginal ring. The ring contains hormones similar to those in birth control pills. It is left in the vagina for 3 weeks, removed for 1 week, and then a new one is put back in place. The patient must be comfortable inserting and removing the ring from the vagina.A caregiver's prescription is necessary.   Emergency contraception. Emergency contraceptives prevent pregnancy after unprotected sexual intercourse. This pill can be taken right after sex or up to 5 days after unprotected sex. It is most effective the sooner you take the pills after having sexual intercourse. Emergency contraceptive pills are available without a prescription. Check with your pharmacist. Do not use emergency contraception as your only form of birth control.  BARRIER METHODS   Female condom. This is a thin sheath (latex or rubber) that is worn over the penis during sexual intercourse. It can be used with spermicide to increase effectiveness.   Female condom. This is a soft, loose-fitting   sheath that is put into the vagina before sexual intercourse.   Diaphragm. This is a soft, latex, dome-shaped barrier that must be fitted by a caregiver. It is inserted into the vagina, along with a spermicidal jelly. It is inserted before intercourse. The diaphragm should be left in the vagina for 6 to 8 hours after intercourse.   Cervical cap. This is a round, soft, latex or plastic cup that fits over the cervix and must be fitted by a caregiver. The cap can be left in place for up to 48 hours after intercourse.   Sponge. This is a soft, circular piece of polyurethane foam. The sponge has spermicide in it. It is inserted into the vagina after wetting it and before sexual intercourse.   Spermicides. These are chemicals that kill or block sperm from entering the cervix and  uterus. They come in the form of creams, jellies, suppositories, foam, or tablets. They do not require a prescription. They are inserted into the vagina with an applicator before having sexual intercourse. The process must be repeated every time you have sexual intercourse.  INTRAUTERINE CONTRACEPTION  Intrauterine device (IUD). This is a T-shaped device that is put in a woman's uterus during a menstrual period to prevent pregnancy. There are 2 types:   Copper IUD. This type of IUD is wrapped in copper wire and is placed inside the uterus. Copper makes the uterus and fallopian tubes produce a fluid that kills sperm. It can stay in place for 10 years.   Hormone IUD. This type of IUD contains the hormone progestin (synthetic progesterone). The hormone thickens the cervical mucus and prevents sperm from entering the uterus, and it also thins the uterine lining to prevent implantation of a fertilized egg. The hormone can weaken or kill the sperm that get into the uterus. It can stay in place for 5 years.  PERMANENT METHODS OF CONTRACEPTION  Female tubal ligation. This is when the woman's fallopian tubes are surgically sealed, tied, or blocked to prevent the egg from traveling to the uterus.   Female sterilization. This is when the female has the tubes that carry sperm tied off (vasectomy).This blocks sperm from entering the vagina during sexual intercourse. After the procedure, the man can still ejaculate fluid (semen).  NATURAL PLANNING METHODS  Natural family planning. This is not having sexual intercourse or using a barrier method (condom, diaphragm, cervical cap) on days the woman could become pregnant.   Calendar method. This is keeping track of the length of each menstrual cycle and identifying when you are fertile.   Ovulation method. This is avoiding sexual intercourse during ovulation.   Symptothermal method. This is avoiding sexual intercourse during ovulation, using a thermometer and  ovulation symptoms.   Post-ovulation method. This is timing sexual intercourse after you have ovulated.  Regardless of which type or method of contraception you choose, it is important that you use condoms to protect against the transmission of sexually transmitted diseases (STDs). Talk with your caregiver about which form of contraception is most appropriate for you. Document Released: 09/23/2005 Document Revised: 09/12/2011 Document Reviewed: 01/30/2011 [employer] Patient Information 2012 [employer]. Breastfeeding BENEFITS OF BREASTFEEDING For the baby  The first milk (colostrum) helps the baby's digestive system function better.   There are antibodies from the mother in the milk that help the baby fight off infections.   The baby has a lower incidence of asthma, allergies, and SIDS (sudden infant death syndrome).   The nutrients in breast milk are   better than formulas for the baby and helps the baby's brain grow better.   Babies who breastfeed have less gas, colic, and constipation.  For the mother  Breastfeeding helps develop a very special bond between mother and baby.   It is more convenient, always available at the correct temperature and cheaper than formula feeding.   It burns calories in the mother and helps with losing weight that was gained during pregnancy.   It makes the uterus contract back down to normal size faster and slows bleeding following delivery.   Breastfeeding mothers have a lower risk of developing breast cancer.  NURSE FREQUENTLY  A healthy, full-term baby may breastfeed as often as every hour or space his or her feedings to every 3 hours.   How often to nurse will vary from baby to baby. Watch your baby for signs of hunger, not the clock.   Nurse as often as the baby requests, or when you feel the need to reduce the fullness of your breasts.   Awaken the baby if it has been 3 to 4 hours since the last feeding.   Frequent feeding will help the  mother make more milk and will prevent problems like sore nipples and engorgement of the breasts.  BABY'S POSITION AT THE BREAST  Whether lying down or sitting, be sure that the baby's tummy is facing your tummy.   Support the breast with 4 fingers underneath the breast and the thumb above. Make sure your fingers are well away from the nipple and baby's mouth.   Stroke the baby's lips and cheek closest to the breast gently with your finger or nipple.   When the baby's mouth is open wide enough, place all of your nipple and as much of the dark area around the nipple as possible into your baby's mouth.   Pull the baby in close so the tip of the nose and the baby's cheeks touch the breast during the feeding.  FEEDINGS  The length of each feeding varies from baby to baby and from feeding to feeding.   The baby must suck about 2 to 3 minutes for your milk to get to him or her. This is called a "let down." For this reason, allow the baby to feed on each breast as long as he or she wants. Your baby will end the feeding when he or she has received the right balance of nutrients.   To break the suction, put your finger into the corner of the baby's mouth and slide it between his or her gums before removing your breast from his or her mouth. This will help prevent sore nipples.  REDUCING BREAST ENGORGEMENT  In the first week after your baby is born, you may experience signs of breast engorgement. When breasts are engorged, they feel heavy, warm, full, and may be tender to the touch. You can reduce engorgement if you:   Nurse frequently, every 2 to 3 hours. Mothers who breastfeed early and often have fewer problems with engorgement.   Place light ice packs on your breasts between feedings. This reduces swelling. Wrap the ice packs in a lightweight towel to protect your skin.   Apply moist hot packs to your breast for 5 to 10 minutes before each feeding. This increases circulation and helps the milk  flow.   Gently massage your breast before and during the feeding.   Make sure that the baby empties at least one breast at every feeding before switching sides.     Use a breast pump to empty the breasts if your baby is sleepy or not nursing well. You may also want to pump if you are returning to work or or you feel you are getting engorged.   Avoid bottle feeds, pacifiers or supplemental feedings of water or juice in place of breastfeeding.   Be sure the baby is latched on and positioned properly while breastfeeding.   Prevent fatigue, stress, and anemia.   Wear a supportive bra, avoiding underwire styles.   Eat a balanced diet with enough fluids.  If you follow these suggestions, your engorgement should improve in 24 to 48 hours. If you are still experiencing difficulty, call your lactation consultant or caregiver. IS MY BABY GETTING ENOUGH MILK? Sometimes, mothers worry about whether their babies are getting enough milk. You can be assured that your baby is getting enough milk if:  The baby is actively sucking and you hear swallowing.   The baby nurses at least 8 to 12 times in a 24 hour time period. Nurse your baby until he or she unlatches or falls asleep at the first breast (at least 10 to 20 minutes), then offer the second side.   The baby is wetting 5 to 6 disposable diapers (6 to 8 cloth diapers) in a 24 hour period by 5 to 6 days of age.   The baby is having at least 2 to 3 stools every 24 hours for the first few months. Breast milk is all the food your baby needs. It is not necessary for your baby to have water or formula. In fact, to help your breasts make more milk, it is best not to give your baby supplemental feedings during the early weeks.   The stool should be soft and yellow.   The baby should gain 4 to 7 ounces per week after he is 4 days old.  TAKE CARE OF YOURSELF Take care of your breasts by:  Bathing or showering daily.   Avoiding the use of soaps on your  nipples.   Start feedings on your left breast at one feeding and on your right breast at the next feeding.   You will notice an increase in your milk supply 2 to 5 days after delivery. You may feel some discomfort from engorgement, which makes your breasts very firm and often tender. Engorgement "peaks" out within 24 to 48 hours. In the meantime, apply warm moist towels to your breasts for 5 to 10 minutes before feeding. Gentle massage and expression of some milk before feeding will soften your breasts, making it easier for your baby to latch on. Wear a well fitting nursing bra and air dry your nipples for 10 to 15 minutes after each feeding.   Only use cotton bra pads.   Only use pure lanolin on your nipples after nursing. You do not need to wash it off before nursing.  Take care of yourself by:   Eating well-balanced meals and nutritious snacks.   Drinking milk, fruit juice, and water to satisfy your thirst (about 8 glasses a day).   Getting plenty of rest.   Increasing calcium in your diet (1200 mg a day).   Avoiding foods that you notice affect the baby in a bad way.  SEEK MEDICAL CARE IF:   You have any questions or difficulty with breastfeeding.   You need help.   You have a hard, red, sore area on your breast, accompanied by a fever of 100.5 F (38.1 C) or more.     Your baby is too sleepy to eat well or is having trouble sleeping.   Your baby is wetting less than 6 diapers per day, by 5 days of age.   Your baby's skin or white part of his or her eyes is more yellow than it was in the hospital.   You feel depressed.  Document Released: 09/23/2005 Document Revised: 09/12/2011 Document Reviewed: 05/08/2009 [employer] Patient Information 2012 [employer]. 

## 2012-02-18 NOTE — Progress Notes (Signed)
No pre-eclampsia sx's.  BP is better today.  Continue twice weekly testing. Culture results reviewed and negative. NST reviewed and reactive.

## 2012-02-21 ENCOUNTER — Ambulatory Visit (HOSPITAL_COMMUNITY)
Admission: RE | Admit: 2012-02-21 | Discharge: 2012-02-21 | Disposition: A | Discharge status: Home / Self Care | Payer: 59 | Source: Ambulatory Visit | Attending: Obstetrics & Gynecology | Admitting: Obstetrics & Gynecology

## 2012-02-21 VITALS — BP 125/87 | HR 65 | Wt 146.0 lb

## 2012-02-21 DIAGNOSIS — O3440 Maternal care for other abnormalities of cervix, unspecified trimester: Secondary | ICD-10-CM

## 2012-02-21 DIAGNOSIS — O169 Unspecified maternal hypertension, unspecified trimester: Secondary | ICD-10-CM

## 2012-02-21 DIAGNOSIS — Z6791 Unspecified blood type, Rh negative: Secondary | ICD-10-CM

## 2012-02-21 DIAGNOSIS — O365990 Maternal care for other known or suspected poor fetal growth, unspecified trimester, not applicable or unspecified: Secondary | ICD-10-CM | POA: Insufficient documentation

## 2012-02-21 DIAGNOSIS — O0990 Supervision of high risk pregnancy, unspecified, unspecified trimester: Secondary | ICD-10-CM

## 2012-02-21 DIAGNOSIS — Z3689 Encounter for other specified antenatal screening: Secondary | ICD-10-CM | POA: Insufficient documentation

## 2012-02-24 ENCOUNTER — Inpatient Hospital Stay (HOSPITAL_COMMUNITY)
Admission: AD | Admit: 2012-02-24 | Discharge: 2012-02-24 | Disposition: A | Discharge status: Hospice | Payer: 59 | Source: Ambulatory Visit | Attending: Obstetrics & Gynecology | Admitting: Obstetrics & Gynecology

## 2012-02-24 ENCOUNTER — Encounter (HOSPITAL_COMMUNITY): Payer: Self-pay | Admitting: *Deleted

## 2012-02-24 DIAGNOSIS — O169 Unspecified maternal hypertension, unspecified trimester: Secondary | ICD-10-CM

## 2012-02-24 DIAGNOSIS — O368190 Decreased fetal movements, unspecified trimester, not applicable or unspecified: Secondary | ICD-10-CM | POA: Insufficient documentation

## 2012-02-24 DIAGNOSIS — Z6791 Unspecified blood type, Rh negative: Secondary | ICD-10-CM

## 2012-02-24 DIAGNOSIS — Z3689 Encounter for other specified antenatal screening: Secondary | ICD-10-CM

## 2012-02-24 DIAGNOSIS — O3440 Maternal care for other abnormalities of cervix, unspecified trimester: Secondary | ICD-10-CM

## 2012-02-24 DIAGNOSIS — O0990 Supervision of high risk pregnancy, unspecified, unspecified trimester: Secondary | ICD-10-CM

## 2012-02-24 DIAGNOSIS — O139 Gestational [pregnancy-induced] hypertension without significant proteinuria, unspecified trimester: Secondary | ICD-10-CM | POA: Insufficient documentation

## 2012-02-24 DIAGNOSIS — O4100X0 Oligohydramnios, unspecified trimester, not applicable or unspecified: Secondary | ICD-10-CM | POA: Insufficient documentation

## 2012-02-24 LAB — URINALYSIS, ROUTINE W REFLEX MICROSCOPIC
Bilirubin Urine: NEGATIVE
Glucose, UA: NEGATIVE mg/dL
Hgb urine dipstick: NEGATIVE
Ketones, ur: NEGATIVE mg/dL
Nitrite: NEGATIVE
Protein, ur: NEGATIVE mg/dL
Specific Gravity, Urine: 1.01 (ref 1.005–1.030)
Urobilinogen, UA: 0.2 mg/dL (ref 0.0–1.0)
pH: 6.5 (ref 5.0–8.0)

## 2012-02-24 LAB — CBC
HCT: 37 % (ref 36.0–46.0)
Hemoglobin: 12.9 g/dL (ref 12.0–15.0)
MCH: 31 pg (ref 26.0–34.0)
MCHC: 34.9 g/dL (ref 30.0–36.0)
MCV: 88.9 fL (ref 78.0–100.0)
Platelets: 184 10*3/uL (ref 150–400)
RBC: 4.16 MIL/uL (ref 3.87–5.11)
RDW: 13.4 % (ref 11.5–15.5)
WBC: 11.2 10*3/uL — ABNORMAL HIGH (ref 4.0–10.5)

## 2012-02-24 LAB — COMPREHENSIVE METABOLIC PANEL
ALT: 9 U/L (ref 0–35)
AST: 17 U/L (ref 0–37)
Albumin: 2.6 g/dL — ABNORMAL LOW (ref 3.5–5.2)
Alkaline Phosphatase: 195 U/L — ABNORMAL HIGH (ref 39–117)
BUN: 16 mg/dL (ref 6–23)
CO2: 23 mEq/L (ref 19–32)
Calcium: 8.8 mg/dL (ref 8.4–10.5)
Chloride: 100 mEq/L (ref 96–112)
Creatinine, Ser: 0.87 mg/dL (ref 0.50–1.10)
GFR calc Af Amer: >90 mL/min (ref >90)
GFR calc non Af Amer: >90 mL/min (ref >90)
Glucose, Bld: 89 mg/dL (ref 70–99)
Potassium: 3.7 mEq/L (ref 3.5–5.1)
Sodium: 134 mEq/L — ABNORMAL LOW (ref 135–145)
Total Bilirubin: 0.2 mg/dL — ABNORMAL LOW (ref 0.3–1.2)
Total Protein: 6.1 g/dL (ref 6.0–8.3)

## 2012-02-24 LAB — URINE MICROSCOPIC-ADD ON

## 2012-02-24 LAB — PROTEIN / CREATININE RATIO, URINE
Creatinine, Urine: 75.88 mg/dL
Protein Creatinine Ratio: 0.26 — ABNORMAL HIGH (ref 0.00–0.15)
Total Protein, Urine: 20.1 mg/dL

## 2012-02-24 NOTE — [facility] Provider Note (Signed)
Attestation of Attending Supervision of Advanced Practitioner: Evaluation and management procedures were performed by the OB Fellow/PA/CNM/NP under my supervision and collaboration. Chart reviewed, and agree with management and plan.  Ramadan Couey, M.D. 02/24/2012 8:25 PM   

## 2012-02-24 NOTE — [facility] Note (Signed)
Pt states she had decreased fetal movement throughout today.

## 2012-02-24 NOTE — [facility] Note (Signed)
Patient states she was diagnosed with low fluid at her last appointment. Has had decreasing fetal movement. Has had some low back and lower abdominal pain. Denies any leaking or bleeding and has felt some movement since being in triage.

## 2012-02-24 NOTE — Discharge Instructions (Signed)
Fetal Movement Counts Patient Name: __________________________________________________ Patient Due Date: ____________________ Kick counts is highly recommended in high risk pregnancies, but it is a good idea for every pregnant woman to do. Start counting fetal movements at 28 weeks of the pregnancy. Fetal movements increase after eating a full meal or eating or drinking something sweet (the blood sugar is higher). It is also important to drink plenty of fluids (well hydrated) before doing the count. Lie on your left side because it helps with the circulation or you can sit in a comfortable chair with your arms over your belly (abdomen) with no distractions around you. DOING THE COUNT  Try to do the count the same time of day each time you do it.   Mark the day and time, then see how long it takes for you to feel 10 movements (kicks, flutters, swishes, rolls). You should have at least 10 movements within 2 hours. You will most likely feel 10 movements in much less than 2 hours. If you do not, wait an hour and count again. After a couple of days you will see a pattern.   What you are looking for is a change in the pattern or not enough counts in 2 hours. Is it taking longer in time to reach 10 movements?  SEEK MEDICAL CARE IF:  You feel less than 10 counts in 2 hours. Tried twice.   No movement in one hour.   The pattern is changing or taking longer each day to reach 10 counts in 2 hours.   You feel the baby is not moving as it usually does.  Date: ____________ Movements: ____________ Start time: ____________ Finish time: ____________  Date: ____________ Movements: ____________ Start time: ____________ Finish time: ____________ Date: ____________ Movements: ____________ Start time: ____________ Finish time: ____________ Date: ____________ Movements: ____________ Start time: ____________ Finish time: ____________ Date: ____________ Movements: ____________ Start time: ____________ Finish time:  ____________ Date: ____________ Movements: ____________ Start time: ____________ Finish time: ____________ Date: ____________ Movements: ____________ Start time: ____________ Finish time: ____________ Date: ____________ Movements: ____________ Start time: ____________ Finish time: ____________  Date: ____________ Movements: ____________ Start time: ____________ Finish time: ____________ Date: ____________ Movements: ____________ Start time: ____________ Finish time: ____________ Date: ____________ Movements: ____________ Start time: ____________ Finish time: ____________ Date: ____________ Movements: ____________ Start time: ____________ Finish time: ____________ Date: ____________ Movements: ____________ Start time: ____________ Finish time: ____________ Date: ____________ Movements: ____________ Start time: ____________ Finish time: ____________ Date: ____________ Movements: ____________ Start time: ____________ Finish time: ____________  Date: ____________ Movements: ____________ Start time: ____________ Finish time: ____________ Date: ____________ Movements: ____________ Start time: ____________ Finish time: ____________ Date: ____________ Movements: ____________ Start time: ____________ Finish time: ____________ Date: ____________ Movements: ____________ Start time: ____________ Finish time: ____________ Date: ____________ Movements: ____________ Start time: ____________ Finish time: ____________ Date: ____________ Movements: ____________ Start time: ____________ Finish time: ____________ Date: ____________ Movements: ____________ Start time: ____________ Finish time: ____________  Date: ____________ Movements: ____________ Start time: ____________ Finish time: ____________ Date: ____________ Movements: ____________ Start time: ____________ Finish time: ____________ Date: ____________ Movements: ____________ Start time: ____________ Finish time: ____________ Date: ____________ Movements:  ____________ Start time: ____________ Finish time: ____________ Date: ____________ Movements: ____________ Start time: ____________ Finish time: ____________ Date: ____________ Movements: ____________ Start time: ____________ Finish time: ____________ Date: ____________ Movements: ____________ Start time: ____________ Finish time: ____________  Date: ____________ Movements: ____________ Start time: ____________ Finish time: ____________ Date: ____________ Movements: ____________ Start time: ____________ Finish time: ____________ Date: ____________ Movements: ____________ Start time:   ____________ Finish time: ____________ Date: ____________ Movements: ____________ Start time: ____________ Finish time: ____________ Date: ____________ Movements: ____________ Start time: ____________ Finish time: ____________ Date: ____________ Movements: ____________ Start time: ____________ Finish time: ____________ Date: ____________ Movements: ____________ Start time: ____________ Finish time: ____________  Date: ____________ Movements: ____________ Start time: ____________ Finish time: ____________ Date: ____________ Movements: ____________ Start time: ____________ Finish time: ____________ Date: ____________ Movements: ____________ Start time: ____________ Finish time: ____________ Date: ____________ Movements: ____________ Start time: ____________ Finish time: ____________ Date: ____________ Movements: ____________ Start time: ____________ Finish time: ____________ Date: ____________ Movements: ____________ Start time: ____________ Finish time: ____________ Date: ____________ Movements: ____________ Start time: ____________ Finish time: ____________  Date: ____________ Movements: ____________ Start time: ____________ Finish time: ____________ Date: ____________ Movements: ____________ Start time: ____________ Finish time: ____________ Date: ____________ Movements: ____________ Start time: ____________ Finish  time: ____________ Date: ____________ Movements: ____________ Start time: ____________ Finish time: ____________ Date: ____________ Movements: ____________ Start time: ____________ Finish time: ____________ Date: ____________ Movements: ____________ Start time: ____________ Finish time: ____________ Date: ____________ Movements: ____________ Start time: ____________ Finish time: ____________  Date: ____________ Movements: ____________ Start time: ____________ Finish time: ____________ Date: ____________ Movements: ____________ Start time: ____________ Finish time: ____________ Date: ____________ Movements: ____________ Start time: ____________ Finish time: ____________ Date: ____________ Movements: ____________ Start time: ____________ Finish time: ____________ Date: ____________ Movements: ____________ Start time: ____________ Finish time: ____________ Date: ____________ Movements: ____________ Start time: ____________ Finish time: ____________ Document Released: 10/23/2006 Document Revised: 09/12/2011 Document Reviewed: 04/25/2009 [employer] Patient Information 2012 [employer].Hypertension During Pregnancy Hypertension is also called high blood pressure. It can occur at any time in life and during pregnancy. When you have hypertension, there is extra pressure inside your blood vessels that carry blood from the heart to the rest of your body (arteries). Hypertension during pregnancy can cause problems for you and your baby. Your baby might not weigh as much as it should at birth or might be born early (premature). Very bad cases of hypertension during pregnancy can be life-threatening.  There are different types of hypertension during pregnancy.   Chronic hypertension. This happens when a woman has hypertension before pregnancy and it continues during pregnancy.   Gestational hypertension. This is when hypertension develops during pregnancy.   Preeclampsia or toxemia of pregnancy. This is a  very serious type of hypertension that develops only during pregnancy. It is a disease that affects the whole body (systemic) and can be very dangerous for both mother and baby.   Gestational hypertension and preeclampsia usually go away after your baby is born. Blood pressure generally stabilizes within 6 weeks. Women who have hypertension during pregnancy have a greater chance of developing hypertension later in life or with future pregnancies. UNDERSTANDING BLOOD PRESSURE Blood pressure moves blood in your body. Sometimes, the force that moves the blood becomes too strong.  A blood pressure reading is given in 2 numbers and looks like a fraction.   The top number is called the systolic pressure. When your heart beats, it forces more blood to flow through the arteries. Pressure inside the arteries goes up.   The bottom number is the diastolic pressure. Pressure goes down between beats. That is when the heart is resting.   You may have hypertension if:   Your systolic blood pressure is above 140.   Your diastolic pressure is above 90.  RISK FACTORS Some factors make you more likely to develop hypertension during pregnancy. Risk factors include:  Having hypertension before pregnancy.     Having hypertension during a previous pregnancy.   Being overweight.   Being older than 40.   Being pregnant with more than 1 baby (multiples).   Having diabetes or kidney problems.  SYMPTOMS Chronic and gestational hypertension may not cause symptoms. Preeclampsia has symptoms, which may include:  Increased protein in your urine. Your caregiver will check for this at every prenatal visit.   Swelling of your hands and face.   Rapid weight gain.   Headaches.   Visual changes.   Being bothered by light.   Abdominal pain, especially in the right upper area.   Chest pain.   Shortness of breath.   Increased reflexes.   Seizures. Seizures occur with a more severe form of preeclampsia,  called eclampsia.  DIAGNOSIS   You may be diagnosed with hypertension during pregnancy during a regular prenatal exam. At each visit, tests may include:   Blood pressure checks.   A urine test to check for protein in your urine.   The type of hypertension you are diagnosed with depends on when you developed it. It also depends on your specific blood pressure reading.   Developing hypertension before 20 weeks of pregnancy is consistent with chronic hypertension.   Developing hypertension after 20 weeks of pregnancy is consistent with gestational hypertension.   Hypertension with increased urinary protein is diagnosed as preeclampsia.   Blood pressure measurements that stay above 160 systolic or 110 diastolic are a sign of severe preeclampsia.  TREATMENT Treatment for hypertension during pregnancy varies. Treatment depends on the type of hypertension and how serious it is.  If you take medicine for chronic hypertension, you may need to switch medicines.   Drugs called ACE inhibitors should not be taken during pregnancy.   Low-dose aspirin may be suggested for women who have risk factors for preeclampsia.   If you have gestational hypertension, you may need to take a blood pressure medicine that is safe during pregnancy. Your caregiver will recommend the appropriate medicine.   If you have severe preeclampsia, you may need to be in the hospital. Caregivers will watch you and the baby very closely. You also may need to take medicine (magnesium sulfate) to prevent seizures and lower blood pressure.   Sometimes an early delivery is needed. This may be the case if the condition worsens. It would be done to protect you and the baby. The only cure for preeclampsia is delivery.  HOME CARE INSTRUCTIONS  Schedule and keep all of your regular prenatal care.   Follow your caregiver's instructions for taking medicines. Tell your caregiver about all medicines you take. This includes  over-the-counter medicines.   Eat as little salt as possible.   Get regular exercise.   Do not drink alcohol.   Do not use tobacco products.   Do not drink products with caffeine.   Lie on your left side when resting.   Tell your doctor if you have any preeclampsia symptoms.  SEEK IMMEDIATE MEDICAL CARE IF:  You have severe abdominal pain.   You have sudden swelling in the hands, ankles, or face.   You gain 4 pounds (1.8 kg) or more in 1 week.   You vomit repeatedly.   You have vaginal bleeding.   You do not feel the baby moving as much.   You have a headache.   You have blurred or double vision.   You have muscle twitching or spasms.   You have shortness of breath.   You have blue fingernails and   lips.   You have blood in your urine.  MAKE SURE YOU:  Understand these instructions.   Will watch your condition.   Will get help right away if you are not doing well.  Document Released: 06/11/2011 Document Revised: 09/12/2011 Document Reviewed: 06/11/2011 [employer] Patient Information 2012 [employer]. 

## 2012-02-24 NOTE — MAU Provider Note (Signed)
Chief Complaint:  Decreased Fetal Movement   First Provider Initiated Contact with Patient 02/24/12 1808      HPI  Gloria Dean is a 26 y.o. G1P0 at [redacted]w[redacted]d presenting with decreased fetal movement.  She has twice weekly NST/BPP at [facility] and MFM and had oligo on Friday.  She denies LOF, vaginal bleeding, vaginal itching/burning, urinary symptoms, h/a, dizziness, n/v, or fever/chills.  She is feeling fetal movement since arrival in MAU.  Pregnancy Course: uncomplicated  Past Medical History: Past Medical History  Diagnosis Date  . Abnormal Pap smear 2010  . Infection     chylamidia  . Asthma     Past Surgical History: Past Surgical History  Procedure Date  . Wisdom tooth extraction     Family History: Family History  Problem Relation Age of Onset  . Cancer Maternal Grandfather     lung cancer    Social History: History  Substance Use Topics  . Smoking status: Former Smoker    Types: Cigarettes    Quit date: 07/09/2011  . Smokeless tobacco: Not on file  . Alcohol Use: No    Allergies: No Known Allergies  Meds:  Prescriptions prior to admission  Medication Sig Dispense Refill  . Prenatal Vit-Fe Fumarate-FA (PRENATAL MULTIVITAMIN) TABS Take 1 tablet by mouth at bedtime.          Physical Exam  Blood pressure 139/89, pulse 75, temperature 97.3 F (36.3 C), temperature source Oral, resp. rate 18, height 5\' 3"  (1.6 m), weight 66.316 kg (146 lb 3.2 oz), last menstrual period 06/01/2011, SpO2 99.00%.  Patient Vitals for the past 24 hrs:  BP Temp Temp src Pulse Resp SpO2 Height Weight  02/24/12 1917 142/90 mmHg - - 75  16  - - -  02/24/12 1827 148/91 mmHg - - 76  - - - -  02/24/12 1751 139/89 mmHg - - 75  18  - - -  02/24/12 1721 148/99 mmHg 97.3 F (36.3 C) Oral 70  16  99 % 5\' 3"  (1.6 m) 66.316 kg (146 lb 3.2 oz)   GENERAL: Well-developed, well-nourished female in no acute distress.  HEENT: normocephalic, good dentition HEART: normal rate RESP: normal  effort ABDOMEN: Soft, nontender, nondistended, gravid.  EXTREMITIES: Nontender, no edema NEURO: alert and oriented  Cervical exam deferred  FHT:  Baseline 145 , moderate variability, accelerations present, no decelerations Contractions: None   Results for orders placed during the hospital encounter of 02/24/12 (from the past 24 hour(s))  URINALYSIS, ROUTINE W REFLEX MICROSCOPIC     Status: Abnormal   Collection Time   02/24/12  7:32 PM      Component Value Range   Color, Urine YELLOW  YELLOW    APPearance CLEAR  CLEAR    Specific Gravity, Urine 1.010  1.005 - 1.030    pH 6.5  5.0 - 8.0    Glucose, UA NEGATIVE  NEGATIVE (mg/dL)   Hgb urine dipstick NEGATIVE  NEGATIVE    Bilirubin Urine NEGATIVE  NEGATIVE    Ketones, ur NEGATIVE  NEGATIVE (mg/dL)   Protein, ur NEGATIVE  NEGATIVE (mg/dL)   Urobilinogen, UA 0.2  0.0 - 1.0 (mg/dL)   Nitrite NEGATIVE  NEGATIVE    Leukocytes, UA SMALL (*) NEGATIVE   URINE MICROSCOPIC-ADD ON     Status: Abnormal   Collection Time   02/24/12  7:32 PM      Component Value Range   Squamous Epithelial / LPF FEW (*) RARE    WBC, UA 7-10  <  3 (WBC/hpf)   RBC / HPF 3-6  <3 (RBC/hpf)   Bacteria, UA MANY (*) RARE   CBC     Status: Abnormal   Collection Time   02/24/12  7:35 PM      Component Value Range   WBC 11.2 (*) 4.0 - 10.5 (K/uL)   RBC 4.16  3.87 - 5.11 (MIL/uL)   Hemoglobin 12.9  12.0 - 15.0 (g/dL)   HCT 14.7  82.9 - 56.2 (%)   MCV 88.9  78.0 - 100.0 (fL)   MCH 31.0  26.0 - 34.0 (pg)   MCHC 34.9  30.0 - 36.0 (g/dL)   RDW 13.0  86.5 - 78.4 (%)   Platelets 184  150 - 400 (K/uL)  COMPREHENSIVE METABOLIC PANEL     Status: Abnormal   Collection Time   02/24/12  7:35 PM      Component Value Range   Sodium 134 (*) 135 - 145 (mEq/L)   Potassium 3.7  3.5 - 5.1 (mEq/L)   Chloride 100  96 - 112 (mEq/L)   CO2 23  19 - 32 (mEq/L)   Glucose, Bld 89  70 - 99 (mg/dL)   BUN 16  6 - 23 (mg/dL)   Creatinine, Ser 6.96  0.50 - 1.10 (mg/dL)   Calcium 8.8  8.4 -  10.5 (mg/dL)   Total Protein 6.1  6.0 - 8.3 (g/dL)   Albumin 2.6 (*) 3.5 - 5.2 (g/dL)   AST 17  0 - 37 (U/L)   ALT 9  0 - 35 (U/L)   Alkaline Phosphatase 195 (*) 39 - 117 (U/L)   Total Bilirubin 0.2 (*) 0.3 - 1.2 (mg/dL)   GFR calc non Af Amer >90  >90 (mL/min)   GFR calc Af Amer >90  >90 (mL/min)    Assessment: Decreased FM Reactive NST Gestational HTN   Plan: CBC, CMP drawn P/C ratio pending  D/C home with preeclampsia precautions P/C ratio pending Urine sent for culture Reviewed fetal kick counting Follow up as scheduled for prenatal visit tomorrow and for twice weekly testing Return to MAU as needed   LEFTWICH-KIRBY, Daniell Paradise 5/20/20136:09 PM

## 2012-02-25 ENCOUNTER — Encounter (HOSPITAL_COMMUNITY): Payer: Self-pay | Admitting: *Deleted

## 2012-02-25 ENCOUNTER — Telehealth (HOSPITAL_COMMUNITY): Payer: Self-pay | Admitting: *Deleted

## 2012-02-25 ENCOUNTER — Ambulatory Visit (INDEPENDENT_AMBULATORY_CARE_PROVIDER_SITE_OTHER): Payer: 59 | Admitting: Obstetrics & Gynecology

## 2012-02-25 ENCOUNTER — Encounter: Payer: Self-pay | Admitting: Obstetrics & Gynecology

## 2012-02-25 VITALS — BP 141/95 | Wt 145.0 lb

## 2012-02-25 DIAGNOSIS — IMO0002 Reserved for concepts with insufficient information to code with codable children: Secondary | ICD-10-CM

## 2012-02-25 DIAGNOSIS — O169 Unspecified maternal hypertension, unspecified trimester: Secondary | ICD-10-CM

## 2012-02-25 DIAGNOSIS — Z3480 Encounter for supervision of other normal pregnancy, unspecified trimester: Secondary | ICD-10-CM

## 2012-02-25 NOTE — Telephone Encounter (Signed)
Preadmission screen  

## 2012-02-25 NOTE — Progress Notes (Signed)
Routine visit. She was at the MAU yesterday evening due to decreased FM. She had a headache yesterday but not today. She denies visual changes or RUQ pain. DTR 2+ bilaterally. I will have her collect a 24 hour urine and have her on bedrest. Pre eclampsia percautions. NST reactive. She will have an IOL scheduled for 39 weeks due to [facility].

## 2012-02-26 ENCOUNTER — Other Ambulatory Visit (INDEPENDENT_AMBULATORY_CARE_PROVIDER_SITE_OTHER): Payer: 59 | Admitting: *Deleted

## 2012-02-26 VITALS — BP 140/96

## 2012-02-26 DIAGNOSIS — O169 Unspecified maternal hypertension, unspecified trimester: Secondary | ICD-10-CM

## 2012-02-26 DIAGNOSIS — IMO0002 Reserved for concepts with insufficient information to code with codable children: Secondary | ICD-10-CM

## 2012-02-26 NOTE — Progress Notes (Signed)
Patient is here for her 24 hour urine and labs.

## 2012-02-26 NOTE — Progress Notes (Signed)
Addended by: Vinnie Langton C on: 02/26/2012 02:26 PM   Modules accepted: Orders

## 2012-02-27 LAB — COMPREHENSIVE METABOLIC PANEL
ALT: 10 IU/L (ref 0–32)
AST: 17 IU/L (ref 0–40)
Albumin/Globulin Ratio: 1.3 (ref 1.1–2.5)
Albumin: 3.3 g/dL — ABNORMAL LOW (ref 3.5–5.5)
Alkaline Phosphatase: 197 IU/L — ABNORMAL HIGH (ref 25–150)
BUN/Creatinine Ratio: 20 (ref 8–20)
BUN: 16 mg/dL (ref 6–20)
CO2: 18 mmol/L — ABNORMAL LOW (ref 20–32)
Calcium: 8.5 mg/dL — ABNORMAL LOW (ref 8.7–10.2)
Chloride: 102 mmol/L (ref 97–108)
Creatinine, Ser: 0.79 mg/dL (ref 0.57–1.00)
GFR calc Af Amer: 119 mL/min/{1.73_m2} (ref >59)
GFR calc non Af Amer: 104 mL/min/{1.73_m2} (ref >59)
Globulin, Total: 2.6 g/dL (ref 1.5–4.5)
Glucose: 75 mg/dL (ref 65–99)
Potassium: 4.2 mmol/L (ref 3.5–5.2)
Sodium: 132 mmol/L — ABNORMAL LOW (ref 134–144)
Total Bilirubin: 0.2 mg/dL (ref 0.0–1.2)
Total Protein: 5.9 g/dL — ABNORMAL LOW (ref 6.0–8.5)

## 2012-02-27 LAB — CBC WITH DIFFERENTIAL
Basophils Absolute: 0 10*3/uL (ref 0.0–0.2)
Basos: 0 % (ref 0–3)
Eos: 1 % (ref 0–7)
Eosinophils Absolute: 0.1 10*3/uL (ref 0.0–0.4)
HCT: 39.1 % (ref 34.0–46.6)
Hemoglobin: 12.9 g/dL (ref 11.1–15.9)
Immature Grans (Abs): 0 10*3/uL (ref 0.0–0.1)
Immature Granulocytes: 0 % (ref 0–2)
Lymphocytes Absolute: 2.3 10*3/uL (ref 0.7–4.5)
Lymphs: 19 % (ref 14–46)
MCH: 30.2 pg (ref 26.6–33.0)
MCHC: 33 g/dL (ref 31.5–35.7)
MCV: 92 fL (ref 79–97)
Monocytes Absolute: 0.6 10*3/uL (ref 0.1–1.0)
Monocytes: 5 % (ref 4–13)
Neutrophils Absolute: 9 10*3/uL — ABNORMAL HIGH (ref 1.8–7.8)
Neutrophils Relative %: 75 % — ABNORMAL HIGH (ref 40–74)
Platelets: 211 10*3/uL (ref 140–415)
RBC: 4.27 x10E6/uL (ref 3.77–5.28)
RDW: 14.2 % (ref 12.3–15.4)
WBC: 12.1 10*3/uL — ABNORMAL HIGH (ref 4.0–10.5)

## 2012-02-27 LAB — CREATININE, URINE, 24 HOUR
Creatinine, 24H Ur: 1435 mg/24 hr (ref 800.0–1800.0)
Creatinine, Ur: 57.4 mg/dL (ref 16.0–327.0)

## 2012-02-27 LAB — PROTEIN, URINE, 24 HOUR
Protein, 24H Urine: 855 mg/24 hr — ABNORMAL HIGH (ref 30.0–150.0)
Protein, Ur: 34.2 mg/dL — ABNORMAL HIGH (ref 0.0–15.0)

## 2012-02-27 LAB — LACTATE DEHYDROGENASE

## 2012-02-28 ENCOUNTER — Inpatient Hospital Stay (HOSPITAL_COMMUNITY)
Admission: AD | Admit: 2012-02-28 | Discharge: 2012-03-01 | DRG: 774 | Disposition: A | Discharge status: Home / Self Care | Payer: 59 | Source: Ambulatory Visit | Attending: Obstetrics and Gynecology | Admitting: Obstetrics and Gynecology

## 2012-02-28 ENCOUNTER — Encounter (HOSPITAL_COMMUNITY): Payer: Self-pay | Admitting: *Deleted

## 2012-02-28 ENCOUNTER — Ambulatory Visit (HOSPITAL_COMMUNITY): Admission: RE | Admit: 2012-02-28 | Payer: 59 | Source: Ambulatory Visit

## 2012-02-28 DIAGNOSIS — O3440 Maternal care for other abnormalities of cervix, unspecified trimester: Secondary | ICD-10-CM

## 2012-02-28 DIAGNOSIS — IMO0002 Reserved for concepts with insufficient information to code with codable children: Secondary | ICD-10-CM | POA: Diagnosis present

## 2012-02-28 DIAGNOSIS — O169 Unspecified maternal hypertension, unspecified trimester: Secondary | ICD-10-CM

## 2012-02-28 DIAGNOSIS — Z6791 Unspecified blood type, Rh negative: Secondary | ICD-10-CM

## 2012-02-28 DIAGNOSIS — O26899 Other specified pregnancy related conditions, unspecified trimester: Secondary | ICD-10-CM

## 2012-02-28 DIAGNOSIS — O0990 Supervision of high risk pregnancy, unspecified, unspecified trimester: Secondary | ICD-10-CM

## 2012-02-28 DIAGNOSIS — O4290 Premature rupture of membranes, unspecified as to length of time between rupture and onset of labor, unspecified weeks of gestation: Principal | ICD-10-CM | POA: Diagnosis present

## 2012-02-28 LAB — COMPREHENSIVE METABOLIC PANEL
ALT: 8 U/L (ref 0–35)
AST: 16 U/L (ref 0–37)
Albumin: 2.3 g/dL — ABNORMAL LOW (ref 3.5–5.2)
Alkaline Phosphatase: 189 U/L — ABNORMAL HIGH (ref 39–117)
BUN: 12 mg/dL (ref 6–23)
CO2: 22 mEq/L (ref 19–32)
Calcium: 8.6 mg/dL (ref 8.4–10.5)
Chloride: 104 mEq/L (ref 96–112)
Creatinine, Ser: 0.8 mg/dL (ref 0.50–1.10)
GFR calc Af Amer: >90 mL/min (ref >90)
GFR calc non Af Amer: >90 mL/min (ref >90)
Glucose, Bld: 79 mg/dL (ref 70–99)
Potassium: 4 mEq/L (ref 3.5–5.1)
Sodium: 136 mEq/L (ref 135–145)
Total Bilirubin: 0.1 mg/dL — ABNORMAL LOW (ref 0.3–1.2)
Total Protein: 5.6 g/dL — ABNORMAL LOW (ref 6.0–8.3)

## 2012-02-28 LAB — CBC
HCT: 36 % (ref 36.0–46.0)
Hemoglobin: 12.5 g/dL (ref 12.0–15.0)
MCH: 31 pg (ref 26.0–34.0)
MCHC: 34.7 g/dL (ref 30.0–36.0)
MCV: 89.3 fL (ref 78.0–100.0)
Platelets: 182 10*3/uL (ref 150–400)
RBC: 4.03 MIL/uL (ref 3.87–5.11)
RDW: 13.7 % (ref 11.5–15.5)
WBC: 13.1 10*3/uL — ABNORMAL HIGH (ref 4.0–10.5)

## 2012-02-28 LAB — LACTATE DEHYDROGENASE: LDH: 176 IU/L (ref 0–214)

## 2012-02-28 LAB — RPR: RPR Ser Ql: NONREACTIVE

## 2012-02-28 LAB — SPECIMEN STATUS REPORT

## 2012-02-28 LAB — POCT FERN TEST: Fern Test: POSITIVE

## 2012-02-28 MED ORDER — SIMETHICONE 80 MG PO CHEW: 80.0000 mg | CHEWABLE_TABLET | ORAL | Status: DC | PRN

## 2012-02-28 MED ORDER — OXYCODONE-ACETAMINOPHEN 5-325 MG PO TABS: 1.0000 | ORAL_TABLET | ORAL | Status: DC | PRN

## 2012-02-28 MED ORDER — TETANUS-DIPHTH-ACELL PERTUSSIS 5-2.5-18.5 LF-MCG/0.5 IM SUSP
0.5000 mL | Freq: Once | INTRAMUSCULAR | Status: AC
  Administered 2012-02-29: 0.5 mL via INTRAMUSCULAR
  Filled 2012-02-28: qty 0.5

## 2012-02-28 MED ORDER — LACTATED RINGERS IV SOLN
INTRAVENOUS | Status: DC
  Administered 2012-02-28: 15:00:00 via INTRAUTERINE

## 2012-02-28 MED ORDER — ONDANSETRON HCL 4 MG PO TABS: 4.0000 mg | ORAL_TABLET | ORAL | Status: DC | PRN

## 2012-02-28 MED ORDER — LACTATED RINGERS IV SOLN
INTRAVENOUS | Status: DC
  Administered 2012-02-28 (×2): via INTRAVENOUS

## 2012-02-28 MED ORDER — IBUPROFEN 600 MG PO TABS
600.0000 mg | ORAL_TABLET | Freq: Four times a day (QID) | ORAL | Status: DC
  Administered 2012-02-28 – 2012-03-01 (×7): 600 mg via ORAL
  Filled 2012-02-28 (×7): qty 1

## 2012-02-28 MED ORDER — IBUPROFEN 600 MG PO TABS: 600.0000 mg | ORAL_TABLET | Freq: Four times a day (QID) | ORAL | Status: DC | PRN

## 2012-02-28 MED ORDER — DIPHENHYDRAMINE HCL 25 MG PO CAPS: 25.0000 mg | ORAL_CAPSULE | Freq: Four times a day (QID) | ORAL | Status: DC | PRN

## 2012-02-28 MED ORDER — OXYTOCIN 20 UNITS IN LACTATED RINGERS INFUSION - SIMPLE
1.0000 m[IU]/min | INTRAVENOUS | Status: DC
  Administered 2012-02-28: 2 m[IU]/min via INTRAVENOUS
  Administered 2012-02-28: 333 m[IU]/min via INTRAVENOUS
  Filled 2012-02-28: qty 1000

## 2012-02-28 MED ORDER — ONDANSETRON HCL 4 MG/2ML IJ SOLN: 4.0000 mg | INTRAMUSCULAR | Status: DC | PRN

## 2012-02-28 MED ORDER — LANOLIN HYDROUS EX OINT: TOPICAL_OINTMENT | CUTANEOUS | Status: DC | PRN

## 2012-02-28 MED ORDER — ONDANSETRON HCL 4 MG/2ML IJ SOLN: 4.0000 mg | Freq: Four times a day (QID) | INTRAMUSCULAR | Status: DC | PRN

## 2012-02-28 MED ORDER — WITCH HAZEL-GLYCERIN EX PADS: 1.0000 "application " | MEDICATED_PAD | CUTANEOUS | Status: DC | PRN

## 2012-02-28 MED ORDER — LIDOCAINE HCL (PF) 1 % IJ SOLN
30.0000 mL | INTRAMUSCULAR | Status: DC | PRN
  Filled 2012-02-28: qty 30

## 2012-02-28 MED ORDER — BUTORPHANOL TARTRATE 2 MG/ML IJ SOLN
INTRAMUSCULAR | Status: AC
  Administered 2012-02-28: 2 mg via INTRAVENOUS
  Filled 2012-02-28: qty 1

## 2012-02-28 MED ORDER — PRENATAL MULTIVITAMIN CH
1.0000 | ORAL_TABLET | Freq: Every day | ORAL | Status: DC
  Administered 2012-02-29 – 2012-03-01 (×2): 1 via ORAL
  Filled 2012-02-28 (×2): qty 1

## 2012-02-28 MED ORDER — BUTORPHANOL TARTRATE 2 MG/ML IJ SOLN
2.0000 mg | Freq: Once | INTRAMUSCULAR | Status: AC
  Administered 2012-02-28: 2 mg via INTRAVENOUS

## 2012-02-28 MED ORDER — TERBUTALINE SULFATE 1 MG/ML IJ SOLN: 0.2500 mg | Freq: Once | INTRAMUSCULAR | Status: DC | PRN

## 2012-02-28 MED ORDER — CITRIC ACID-SODIUM CITRATE 334-500 MG/5ML PO SOLN: 30.0000 mL | ORAL | Status: DC | PRN

## 2012-02-28 MED ORDER — SENNOSIDES-DOCUSATE SODIUM 8.6-50 MG PO TABS
2.0000 | ORAL_TABLET | Freq: Every day | ORAL | Status: DC
  Administered 2012-02-28 – 2012-02-29 (×2): 2 via ORAL

## 2012-02-28 MED ORDER — DIBUCAINE 1 % RE OINT
1.0000 | TOPICAL_OINTMENT | RECTAL | Status: DC | PRN
Start: 2012-02-28 — End: 2012-03-01

## 2012-02-28 MED ORDER — OXYTOCIN 20 UNITS IN LACTATED RINGERS INFUSION - SIMPLE: 125.0000 mL/h | Freq: Once | INTRAVENOUS | Status: DC

## 2012-02-28 MED ORDER — BENZOCAINE-MENTHOL 20-0.5 % EX AERO
1.0000 | INHALATION_SPRAY | CUTANEOUS | Status: DC | PRN
Start: 2012-02-28 — End: 2012-03-01
  Administered 2012-02-28: 1 via TOPICAL
  Filled 2012-02-28: qty 56

## 2012-02-28 MED ORDER — FLEET ENEMA 7-19 GM/118ML RE ENEM: 1.0000 | ENEMA | RECTAL | Status: DC | PRN

## 2012-02-28 MED ORDER — ACETAMINOPHEN 325 MG PO TABS
650.0000 mg | ORAL_TABLET | ORAL | Status: DC | PRN
Start: 2012-02-28 — End: 2012-02-28

## 2012-02-28 MED ORDER — OXYTOCIN BOLUS FROM INFUSION
500.0000 mL | Freq: Once | INTRAVENOUS | Status: DC
  Filled 2012-02-28: qty 500

## 2012-02-28 MED ORDER — ZOLPIDEM TARTRATE 5 MG PO TABS: 5.0000 mg | ORAL_TABLET | Freq: Every evening | ORAL | Status: DC | PRN

## 2012-02-28 MED ORDER — LACTATED RINGERS IV SOLN: 500.0000 mL | INTRAVENOUS | Status: DC | PRN

## 2012-02-28 NOTE — MAU Provider Note (Signed)
I examined pt and agree with documentation above and resident plan of care. [facility]  Consulted with Dr. Jolayne Panther and reviewed lab results and HPI > agrees with plan to defer Mag Sulfate at this time [facility]

## 2012-02-28 NOTE — MAU Provider Note (Signed)
Pt admitted to L&D.  See H&P for details. Chancy Hurter MD

## 2012-02-28 NOTE — H&P (Addendum)
CC: LOF  History of present illness:  Gloria Dean is a 26 y.o. G1P0 at 38'6 wks presenting with a chief complaint of LOF, clear, at 0700 today.  A small gush with some leaking down her leg.  No VB or ctx.  +FM.  No HA, vision changes, SOB, CP, RUQ pain or hand/face edema.  Prenatal issues: -preeclampsia: persistently elevated BPs in office visits for several weeks.  Not on meds. 5/2: 24hr urine protein 240, Cr 0.87, AST/ALT/Plt WNL.  5/22: 24hr urine protein 855mg , Cr 0.79, AST/ALT/Plt WNL.   Normal twice-weekly NSTs.  -oligohydramnios: AFI 6.9 on 5/17, was due for repeat US today. -Rh neg: s/p Rhogam 12/2010. -abnl pap: ASCUS, HPV+ this pregnancy.  Had colpo w/o biopsies.  Needs repeat pap PP and repeat colpo if abnl. -plans breastfeeding, considering LARCs for BCM.  OB History    Grav Para Term Preterm Abortions TAB SAB Ect Mult Living   1              Past Medical History  Diagnosis Date  . Abnormal Pap smear- no LEEP/cervical surgery 2010  . Infection     chylamidia  . Asthma- exercise-induced in high school no issues/treatment since then    Past Surgical History  Procedure Date  . Wisdom tooth extraction    Family History: family history includes Cancer in her maternal grandfather. Social History:  reports that she quit smoking about 7 months ago. Her smoking use included Cigarettes. She has never used smokeless tobacco. She reports that she does not drink alcohol or use illicit drugs.  Review of Systems  Constitutional: Negative for fever and chills.  HENT: Negative for sore throat.   Eyes: Negative for blurred vision and double vision.  Respiratory: Negative for cough and shortness of breath.   Cardiovascular: Negative for chest pain and palpitations.  Gastrointestinal: Negative for vomiting.  Genitourinary: Negative for dysuria and urgency.  Musculoskeletal: Negative for myalgias and joint pain.  Skin: Negative for itching and rash.  Neurological: Negative for  dizziness, seizures and headaches.  Endo/Heme/Allergies: Negative for polydipsia. Does not bruise/bleed easily.  Psychiatric/Behavioral: Negative for depression. The patient is not nervous/anxious.     Blood pressure 133/85, pulse 71, temperature 97.9 F (36.6 C), temperature source Oral, resp. rate 20, height 5\' 3"  (1.6 m), weight 67.586 kg (149 lb), last menstrual period 06/01/2011. Exam Physical Exam  Constitutional: She is oriented to person, place, and time. She appears well-developed. No distress.  HENT:  Head: Normocephalic and atraumatic.  Eyes: Conjunctivae and EOM are normal.  Neck: Normal range of motion. Neck supple.  Cardiovascular: Normal rate, regular rhythm, normal heart sounds and intact distal pulses.   Respiratory: Effort normal and breath sounds normal.  GI:       Gravid, EFW 6lb, no RUQ tenderness  Musculoskeletal: Normal range of motion. She exhibits no edema.  Neurological: She is alert and oriented to person, place, and time.  Skin: Skin is warm and dry.  Psychiatric: She has a normal mood and affect. Her behavior is normal.  SSE: +pooling, +ferning. SVE: 3/70/0/soft/mid-position/vtx  EFM: baseline 150, moderate variability, positive accels, no decels. Toco: q3-5 min, pt not aware of them.   Prenatal labs: ABO, Rh: AB/NEG/-- (10/15 0933) Antibody: NEG (10/15 0933) Rubella: 115.7 (10/15 0933) RPR: NON REAC (03/12 1103)  HBsAg: NEGATIVE (10/15 0933)  HIV: NON REACTIVE (03/12 1103)  GBS: Negative (05/07 0000)   Assessment/Plan: Gloria Dean is a 26 y.o. G1P0 at 38'6 wks being admitted  for PROM.  -PROM: ctx on monitor but not aware of them.  Bishop score 9.  Will start pitocin. -Pain  control:  Would like to avoid epidural if possible. -FWB: GBS neg, vtx, cat 1 tracing. -preeclampsia:  Non-severe BPs today.  No signs/symptoms severe preeclampsia.  Will monitor closely for development of either.  Will defer Mag at this time.  Has not required any  antihypertensives yet this pregnancy.  AST/ALT/plt normal on 5/22 with borderline Cr (0.79), but improved from prior.  Repeat CMP/CBC today. -oligohydramnios: AFI 6.9 on 5/17. -Rh neg: s/p Rhogam 12/2010. -abnl pap: ASCUS, HPV+ this pregnancy.  Had colpo w/o biopsies.  Needs repeat pap PP and repeat colpo if abnl. -plans breastfeeding, considering LARCs for BCM.  Gloria Hurter MD  02/28/2012, 9:42 AM   I examined pt and agree with documentation above and resident plan of care. [employer]

## 2012-02-28 NOTE — Progress Notes (Signed)
I examined pt and agree with documentation above and resident plan of care. MUHAMMAD,Sohan Potvin  

## 2012-02-28 NOTE — MAU Note (Signed)
States she noted 1 small gush of fluid that trickled down leg, then 1 more small trickle. States she has had low fluid and hypertension. Prenatal care at [facility]. Scheduled for induction Tuesday.

## 2012-02-28 NOTE — H&P (Signed)
I examined pt and agree with documentation above and resident plan of care. Gloria Dean,Gloria Dean  

## 2012-02-28 NOTE — Progress Notes (Signed)
Subjective: Finally having painful ctx.  No VB.  No HA, vision changes, CP, SOB, RUQ pain.  +FM.  Objective: BP 155/78  Pulse 66  Temp(Src) 97.7 F (36.5 C) (Oral)  Resp 16  Ht 5\' 3"  (1.6 m)  Wt 67.586 kg (149 lb)  BMI 26.39 kg/m2  LMP 06/01/2011     FHT:  Baseline 145, moderate variability, positive accels, initially intermittent then recurrent brief variable decels that spontaneously resolve. UC:   q2-4 min SVE:   5/90/0/vtx  Labs: No new labs.  Assessment / Plan: Aydia Maj is a 26 y.o. G1P0 at 38'6 wks admitted for PROM.   -PROM/AOL: Started pit @ 11:30am.  Ctx now more regular and painful.  Significant cervical change.  Will now titrate pit based on MVUs.  -Pain control:  Requesting pain meds.  Will try stadol now.  -FWB: GBS neg, vtx, cat 2 tracing due to recurrent variables, not responding to position changes.  Placed IUPC and started amnioinfusion at 1530.  -preeclampsia: Non-severe BPs.  No signs/symptoms severe preeclampsia. Will monitor closely for development of either.   Chancy Hurter MD  02/28/2012, 3:35 PM

## 2012-02-28 NOTE — Progress Notes (Signed)
Subjective: No complaints.  Starting to feel ctx but not quite painful yet.  No VB.  No HA, vision changes, CP, SOB, RUQ pain.  +FM.  Objective: BP 132/83  Pulse 63  Temp(Src) 97.8 F (36.6 C) (Oral)  Resp 18  Ht 5\' 3"  (1.6 m)  Wt 67.586 kg (149 lb)  BMI 26.39 kg/m2  LMP 06/01/2011     FHT:  Baseline 140, moderate variability, positive accels, intermittent, brief variable decels that spontaneously resolve. UC:   q3-5 min SVE:   deferred Labs: Lab Results  Component Value Date   WBC 13.1* 02/28/2012   HGB 12.5 02/28/2012   HCT 36.0 02/28/2012   MCV 89.3 02/28/2012   PLT 182 02/28/2012  ALT 8 AST 16 Cr 0.8  Assessment / Plan: Cleo Santucci is a 26 y.o. G1P0 at 38'6 wks admitted for PROM.   -PROM/AOL: Started pit @ 11:30am.  Still not regular ctx pattern and only slightly uncomfortable.  Cont to titrate pit and repeat cervical exam only with regular, painful ctx.   -Pain control: Wants to try natural labor but open to epidural if needed.  -FWB: GBS neg, vtx, cat 1 tracing with episodes of cat 2- trying side-lying to minimize the intermittent variables.  Will consider IUPC/amnioinfusion if variables become recurrent.  -preeclampsia: Non-severe BPs.  No signs/symptoms severe preeclampsia. Will monitor closely for development of either.  AST/ALT/plt normal today, with slightly elevated Cr (0.8), that has been stable over the last 3 lab draws.   Chancy Hurter MD  02/28/2012, 1:40 PM

## 2012-02-28 NOTE — Progress Notes (Signed)
I examined pt and agree with documentation above and resident plan of care. MUHAMMAD,Amoreena Neubert  

## 2012-02-29 MED ORDER — NORETHINDRONE 0.35 MG PO TABS: 1.0000 | ORAL_TABLET | Freq: Every day | ORAL | Status: DC

## 2012-02-29 MED ORDER — BENZOCAINE-MENTHOL 20-0.5 % EX AERO: 1.0000 "application " | INHALATION_SPRAY | CUTANEOUS | Status: DC | PRN

## 2012-02-29 MED ORDER — IBUPROFEN 600 MG PO TABS: 600.0000 mg | ORAL_TABLET | Freq: Four times a day (QID) | ORAL | Status: AC

## 2012-02-29 NOTE — Discharge Instructions (Signed)
Delivery Note At 4:49 PM a {baby status:3041425} female was delivered via Vaginal, Spontaneous Delivery (Presentation: Right Occiput Anterior).  APGAR: 8, 9; weight 5 lb 6.8 oz (2460 g).   Placenta status: Intact, Spontaneous.  Cord: 3 vessels with the following complications: None.  Cord pH: ***  Anesthesia: None  Episiotomy: None Lacerations: 1st degree;Labial Suture Repair: {suture ZOXW:9604540} Est. Blood Loss (mL): 200  Mom to {mom status:3041454}.  Baby to {baby status:3041455}.  Elias Bordner C. 02/29/2012, 7:20 AM

## 2012-02-29 NOTE — Plan of Care (Signed)
Problem: Phase II Progression Outcomes Goal: Rh isoimmunization per orders Outcome: Not Applicable Date Met:  02/29/12 Baby B-/ NAC for rhogam

## 2012-03-03 ENCOUNTER — Inpatient Hospital Stay (HOSPITAL_COMMUNITY): Admission: RE | Admit: 2012-03-03 | Payer: 59 | Source: Ambulatory Visit

## 2012-03-06 ENCOUNTER — Ambulatory Visit (HOSPITAL_COMMUNITY): Payer: 59

## 2012-03-08 NOTE — H&P (Signed)
Agree with above note.  Gloria Dean 03/08/2012 6:04 AM

## 2012-04-01 ENCOUNTER — Ambulatory Visit: Payer: 59 | Admitting: Obstetrics and Gynecology

## 2012-04-14 ENCOUNTER — Encounter: Payer: Self-pay | Admitting: Family Medicine

## 2012-04-14 ENCOUNTER — Ambulatory Visit (INDEPENDENT_AMBULATORY_CARE_PROVIDER_SITE_OTHER): Payer: 59 | Admitting: Family Medicine

## 2012-04-14 NOTE — Progress Notes (Signed)
  Subjective:     Gloria Dean is a 26 y.o. female who presents for a postpartum visit. She is 6 weeks postpartum following a spontaneous vaginal delivery. I have fully reviewed the prenatal and intrapartum course. The delivery was at 39 gestational weeks. Outcome: spontaneous vaginal delivery. Anesthesia: IV sedation. Postpartum course has been normal. Baby's course has been nml. Baby is feeding by both breast and bottle - Enfamil AR. Bleeding no bleeding. Bowel function is normal. Bladder function is normal. Patient is not sexually active. Contraception method is oral progesterone-only contraceptive. Postpartum depression screening: negative.  The following portions of the patient's history were reviewed and updated as appropriate: allergies, current medications, past family history, past medical history, past social history, past surgical history and problem list.  Review of Systems A comprehensive review of systems was negative.   Objective:    BP 110/72  Pulse 75  Ht 5\' 3"  (1.6 m)  Wt 126 lb (57.153 kg)  BMI 22.32 kg/m2  Breastfeeding? Yes  General:  alert, cooperative and appears stated age     Lungs: clear to auscultation bilaterally  Heart:  regular rate and rhythm, S1, S2 normal, no murmur, click, rub or gallop  Abdomen: soft, non-tender; bowel sounds normal; no masses,  no organomegaly   Vulva:  normal  Vagina: normal vagina  Cervix:  multiparous appearance  Corpus: normal size, contour, position, consistency, mobility, non-tender  Adnexa:  normal adnexa           Assessment:    6 wk postpartum exam. Pap smear not done at today's visit.   Plan:    1. Contraception: oral progesterone-only contraceptive 2. Discussed timing of second pregnancy. 3. Follow up in: 1 year or as needed.

## 2013-04-22 ENCOUNTER — Encounter: Payer: Self-pay | Admitting: *Deleted

## 2013-04-22 ENCOUNTER — Ambulatory Visit (INDEPENDENT_AMBULATORY_CARE_PROVIDER_SITE_OTHER): Payer: 59 | Admitting: *Deleted

## 2013-04-22 VITALS — BP 103/64 | Wt 127.0 lb

## 2013-04-22 DIAGNOSIS — Z3491 Encounter for supervision of normal pregnancy, unspecified, first trimester: Secondary | ICD-10-CM

## 2013-04-22 DIAGNOSIS — Z3480 Encounter for supervision of other normal pregnancy, unspecified trimester: Secondary | ICD-10-CM

## 2013-04-22 LAB — OB RESULTS CONSOLE GC/CHLAMYDIA
Chlamydia: NEGATIVE
Gonorrhea: NEGATIVE

## 2013-04-22 NOTE — Progress Notes (Signed)
P= 63 

## 2013-04-26 LAB — URINE CULTURE: Organism ID, Bacteria: NO GROWTH

## 2013-04-29 LAB — GC/CHLAMYDIA PROBE AMP
Chlamydia trachomatis, NAA: NEGATIVE
Neisseria gonorrhoeae by PCR: NEGATIVE

## 2013-04-29 LAB — PRENATAL PROFILE I(LABCORP): Rubella Antibodies, IGG: 4.05 index (ref >0.99)

## 2013-04-29 LAB — SPECIMEN STATUS REPORT

## 2013-05-03 LAB — PANEL 083904
HIV 1 AB: NONREACTIVE
HIV 2 AB: NONREACTIVE

## 2013-05-07 ENCOUNTER — Encounter: Payer: Self-pay | Admitting: Family Medicine

## 2013-05-07 ENCOUNTER — Other Ambulatory Visit: Payer: Self-pay | Admitting: Family Medicine

## 2013-05-07 ENCOUNTER — Ambulatory Visit (INDEPENDENT_AMBULATORY_CARE_PROVIDER_SITE_OTHER): Payer: 59 | Admitting: Family Medicine

## 2013-05-07 VITALS — BP 109/66 | Wt 116.0 lb

## 2013-05-07 DIAGNOSIS — O3440 Maternal care for other abnormalities of cervix, unspecified trimester: Secondary | ICD-10-CM

## 2013-05-07 DIAGNOSIS — Z124 Encounter for screening for malignant neoplasm of cervix: Secondary | ICD-10-CM

## 2013-05-07 DIAGNOSIS — O360191 Maternal care for anti-D [Rh] antibodies, unspecified trimester, fetus 1: Secondary | ICD-10-CM

## 2013-05-07 DIAGNOSIS — Z3480 Encounter for supervision of other normal pregnancy, unspecified trimester: Secondary | ICD-10-CM

## 2013-05-07 DIAGNOSIS — Z3490 Encounter for supervision of normal pregnancy, unspecified, unspecified trimester: Secondary | ICD-10-CM | POA: Insufficient documentation

## 2013-05-07 DIAGNOSIS — Z3481 Encounter for supervision of other normal pregnancy, first trimester: Secondary | ICD-10-CM

## 2013-05-07 DIAGNOSIS — Z1151 Encounter for screening for human papillomavirus (HPV): Secondary | ICD-10-CM

## 2013-05-07 DIAGNOSIS — O360990 Maternal care for other rhesus isoimmunization, unspecified trimester, not applicable or unspecified: Secondary | ICD-10-CM

## 2013-05-07 LAB — CYSTIC FIBROSIS (CF): CFTR

## 2013-05-07 LAB — SPECIMEN STATUS REPORT

## 2013-05-07 NOTE — Patient Instructions (Signed)
Pregnancy - First Trimester During sexual intercourse, millions of sperm go into the vagina. Only 1 sperm will penetrate and fertilize the female egg while it is in the Fallopian tube. One week later, the fertilized egg implants into the wall of the uterus. An embryo begins to develop into a baby. At 6 to 8 weeks, the eyes and face are formed and the heartbeat can be seen on ultrasound. At the end of 12 weeks (first trimester), all the baby's organs are formed. Now that you are pregnant, you will want to do everything you can to have a healthy baby. Two of the most important things are to get good prenatal care and follow your caregiver's instructions. Prenatal care is all the medical care you receive before the baby's birth. It is given to prevent, find, and treat problems during the pregnancy and childbirth. PRENATAL EXAMS  During prenatal visits, your weight, blood pressure, and urine are checked. This is done to make sure you are healthy and progressing normally during the pregnancy.  A pregnant woman should gain 25 to 35 pounds during the pregnancy. However, if you are overweight or underweight, your caregiver will advise you regarding your weight.  Your caregiver will ask and answer questions for you.  Blood work, cervical cultures, other necessary tests, and a Pap test are done during your prenatal exams. These tests are done to check on your health and the probable health of your baby. Tests are strongly recommended and done for HIV with your permission. This is the virus that causes AIDS. These tests are done because medicines can be given to help prevent your baby from being born with this infection should you have been infected without knowing it. Blood work is also used to find out your blood type, previous infections, and follow your blood levels (hemoglobin).  Low hemoglobin (anemia) is common during pregnancy. Iron and vitamins are given to help prevent this. Later in the pregnancy,  blood tests for diabetes will be done along with any other tests if any problems develop.  You may need other tests to make sure you and the baby are doing well. CHANGES DURING THE FIRST TRIMESTER  Your body goes through many changes during pregnancy. They vary from person to person. Talk to your caregiver about changes you notice and are concerned about. Changes can include:  Your menstrual period stops.  The egg and sperm carry the genes that determine what you look like. Genes from you and your partner are forming a baby. The female genes determine whether the baby is a boy or a girl.  Your body increases in girth and you may feel bloated.  Feeling sick to your stomach (nauseous) and throwing up (vomiting). If the vomiting is uncontrollable, call your caregiver.  Your breasts will begin to enlarge and become tender.  Your nipples may stick out more and become darker.  The need to urinate more. Painful urination may mean you have a bladder infection.  Tiring easily.  Loss of appetite.  Cravings for certain kinds of food.  At first, you may gain or lose a couple of pounds.  You may have changes in your emotions from day to day (excited to be pregnant or concerned something may go wrong with the pregnancy and baby).  You may have more vivid and strange dreams. HOME CARE INSTRUCTIONS   It is very important to avoid all smoking, alcohol and non-prescribed drugs during your pregnancy. These affect the formation and growth of the baby.   Avoid chemicals while pregnant to ensure the delivery of a healthy infant.  Start your prenatal visits by the 12th week of pregnancy. They are usually scheduled monthly at first, then more often in the last 2 months before delivery. Keep your caregiver's appointments. Follow your caregiver's instructions regarding medicine use, blood and lab tests, exercise, and diet.  During pregnancy, you are providing food for you and your baby. Eat regular,  well-balanced meals. Choose foods such as meat, fish, milk and other low fat dairy products, vegetables, fruits, and whole-grain breads and cereals. Your caregiver will tell you of the ideal weight gain.  You can help morning sickness by keeping soda crackers at the bedside. Eat a couple before arising in the morning. You may want to use the crackers without salt on them.  Eating 4 to 5 small meals rather than 3 large meals a day also may help the nausea and vomiting.  Drinking liquids between meals instead of during meals also seems to help nausea and vomiting.  A physical sexual relationship may be continued throughout pregnancy if there are no other problems. Problems may be early (premature) leaking of amniotic fluid from the membranes, vaginal bleeding, or belly (abdominal) pain.  Exercise regularly if there are no restrictions. Check with your caregiver or physical therapist if you are unsure of the safety of some of your exercises. Greater weight gain will occur in the last 2 trimesters of pregnancy. Exercising will help:  Control your weight.  Keep you in shape.  Prepare you for labor and delivery.  Help you lose your pregnancy weight after you deliver your baby.  Wear a good support or jogging bra for breast tenderness during pregnancy. This may help if worn during sleep too.  Ask when prenatal classes are available. Begin classes when they are offered.  Do not use hot tubs, steam rooms, or saunas.  Wear your seat belt when driving. This protects you and your baby if you are in an accident.  Avoid raw meat, uncooked cheese, cat litter boxes, and soil used by cats throughout the pregnancy. These carry germs that can cause birth defects in the baby.  The first trimester is a good time to visit your dentist for your dental health. Getting your teeth cleaned is okay. Use a softer toothbrush and brush gently during pregnancy.  Ask for help if you have financial, counseling, or  nutritional needs during pregnancy. Your caregiver will be able to offer counseling for these needs as well as refer you for other special needs.  Do not take any medicines or herbs unless told by your caregiver.  Inform your caregiver if there is any mental or physical domestic violence.  Make a list of emergency phone numbers of family, friends, hospital, and police and fire departments.  Write down your questions. Take them to your prenatal visit.  Do not douche.  Do not cross your legs.  If you have to stand for long periods of time, rotate you feet or take small steps in a circle.  You may have more vaginal secretions that may require a sanitary pad. Do not use tampons or scented sanitary pads. MEDICINES AND DRUG USE IN PREGNANCY  Take prenatal vitamins as directed. The vitamin should contain 1 milligram of folic acid. Keep all vitamins out of reach of children. Only a couple vitamins or tablets containing iron may be fatal to a baby or young child when ingested.  Avoid use of all medicines, including herbs, over-the-counter medicines, not   prescribed or suggested by your caregiver. Only take over-the-counter or prescription medicines for pain, discomfort, or fever as directed by your caregiver. Do not use aspirin, ibuprofen, or naproxen unless directed by your caregiver.  Let your caregiver also know about herbs you may be using.  Alcohol is related to a number of birth defects. This includes fetal alcohol syndrome. All alcohol, in any form, should be avoided completely. Smoking will cause low birth rate and premature babies.  Street or illegal drugs are very harmful to the baby. They are absolutely forbidden. A baby born to an addicted mother will be addicted at birth. The baby will go through the same withdrawal an adult does.  Let your caregiver know about any medicines that you have to take and for what reason you take them. SEEK MEDICAL CARE IF:  You have any concerns or  worries during your pregnancy. It is better to call with your questions if you feel they cannot wait, rather than worry about them. SEEK IMMEDIATE MEDICAL CARE IF:   An unexplained oral temperature above 102 F (38.9 C) develops, or as your caregiver suggests.  You have leaking of fluid from the vagina (birth canal). If leaking membranes are suspected, take your temperature and inform your caregiver of this when you call.  There is vaginal spotting or bleeding. Notify your caregiver of the amount and how many pads are used.  You develop a bad smelling vaginal discharge with a change in the color.  You continue to feel sick to your stomach (nauseated) and have no relief from remedies suggested. You vomit blood or coffee ground-like materials.  You lose more than 2 pounds of weight in 1 week.  You gain more than 2 pounds of weight in 1 week and you notice swelling of your face, hands, feet, or legs.  You gain 5 pounds or more in 1 week (even if you do not have swelling of your hands, face, legs, or feet).  You get exposed to German measles and have never had them.  You are exposed to fifth disease or chickenpox.  You develop belly (abdominal) pain. Round ligament discomfort is a common non-cancerous (benign) cause of abdominal pain in pregnancy. Your caregiver still must evaluate this.  You develop headache, fever, diarrhea, pain with urination, or shortness of breath.  You fall or are in a car accident or have any kind of trauma.  There is mental or physical violence in your home. Document Released: 09/17/2001 Document Revised: 06/17/2012 Document Reviewed: 03/21/2009 [employer] Patient Information 2014 [employer].  Breastfeeding A change in hormones during your pregnancy causes growth of your breast tissue and an increase in number and size of milk ducts. The hormone prolactin allows proteins, sugars, and fats from your blood supply to make breast milk in your milk-producing  glands. The hormone progesterone prevents breast milk from being released before the birth of your baby. After the birth of your baby, your progesterone level decreases allowing breast milk to be released. Thoughts of your baby, as well as his or her sucking or crying, can stimulate the release of milk from the milk-producing glands. Deciding to breastfeed (nurse) is one of the best choices you can make for you and your baby. The information that follows gives a brief review of the benefits, as well as other important skills to know about breastfeeding. BENEFITS OF BREASTFEEDING For your baby  The first milk (colostrum) helps your baby's digestive system function better.   There are antibodies in   your milk that help your baby fight off infections.   Your baby has a lower incidence of asthma, allergies, and sudden infant death syndrome (SIDS).   The nutrients in breast milk are better for your baby than infant formulas.  Breast milk improves your baby's brain development.   Your baby will have less gas, colic, and constipation.  Your baby is less likely to develop other conditions, such as childhood obesity, asthma, or diabetes mellitus. For you  Breastfeeding helps develop a very special bond between you and your baby.   Breastfeeding is convenient, always available at the correct temperature, and costs nothing.   Breastfeeding helps to burn calories and helps you lose the weight gained during pregnancy.   Breastfeeding makes your uterus contract back down to normal size faster and slows bleeding following delivery.   Breastfeeding mothers have a lower risk of developing osteoporosis or breast or ovarian cancer later in life.  BREASTFEEDING FREQUENCY  A healthy, full-term baby may breastfeed as often as every hour or space his or her feedings to every 3 hours. Breastfeeding frequency will vary from baby to baby.   Newborns should be fed no less than every 2 3 hours during  the day and every 4 5 hours during the night. You should breastfeed a minimum of 8 feedings in a 24 hour period.  Awaken your baby to breastfeed if it has been 3 4 hours since the last feeding.  Breastfeed when you feel the need to reduce the fullness of your breasts or when your newborn shows signs of hunger. Signs that your baby may be hungry include:  Increased alertness or activity.  Stretching.  Movement of the head from side to side.  Movement of the head and opening of the mouth when the corner of the mouth or cheek is stroked (rooting).  Increased sucking sounds, smacking lips, cooing, sighing, or squeaking.  Hand-to-mouth movements.  Increased sucking of fingers or hands.  Fussing.  Intermittent crying.  Signs of extreme hunger will require calming and consoling before you try to feed your baby. Signs of extreme hunger may include:  Restlessness.  A loud, strong cry.  Screaming.  Frequent feeding will help you make more milk and will help prevent problems, such as sore nipples and engorgement of the breasts.  BREASTFEEDING   Whether lying down or sitting, be sure that the baby's abdomen is facing your abdomen.   Support your breast with 4 fingers under your breast and your thumb above your nipple. Make sure your fingers are well away from your nipple and your baby's mouth.   Stroke your baby's lips gently with your finger or nipple.   When your baby's mouth is open wide enough, place all of your nipple and as much of the colored area around your nipple (areola) as possible into your baby's mouth.  More areola should be visible above his or her upper lip than below his or her lower lip.  Your baby's tongue should be between his or her lower gum and your breast.  Ensure that your baby's mouth is correctly positioned around the nipple (latched). Your baby's lips should create a seal on your breast.  Signs that your baby has effectively latched onto your  nipple include:  Tugging or sucking without pain.  Swallowing heard between sucks.  Absent click or smacking sound.  Muscle movement above and in front of his or her ears with sucking.  Your baby must suck about 2 3 minutes in   order to get your milk. Allow your baby to feed on each breast as long as he or she wants. Nurse your baby until he or she unlatches or falls asleep at the first breast, then offer the second breast.  Signs that your baby is full and satisfied include:  A gradual decrease in the number of sucks or complete cessation of sucking.  Falling asleep.  Extension or relaxation of his or her body.  Retention of a small amount of milk in his or her mouth.  Letting go of your breast by himself or herself.  Signs of effective breastfeeding in you include:  Breasts that have increased firmness, weight, and size prior to feeding.  Breasts that are softer after nursing.  Increased milk volume, as well as a change in milk consistency and color by the 5th day of breastfeeding.  Breast fullness relieved by breastfeeding.  Nipples are not sore, cracked, or bleeding.  If needed, break the suction by putting your finger into the corner of your baby's mouth and sliding your finger between his or her gums. Then, remove your breast from his or her mouth.  It is common for babies to spit up a small amount after a feeding.  Babies often swallow air during feeding. This can make babies fussy. Burping your baby between breasts can help with this.  Vitamin D supplements are recommended for babies who get only breast milk.  Avoid using a pacifier during your baby's first 4 6 weeks.  Avoid supplemental feedings of water, formula, or juice in place of breastfeeding. Breast milk is all the food your baby needs. It is not necessary for your baby to have water or formula. Your breasts will make more milk if supplemental feedings are avoided during the early weeks. HOW TO TELL  WHETHER YOUR BABY IS GETTING ENOUGH BREAST MILK Wondering whether or not your baby is getting enough milk is a common concern among mothers. You can be assured that your baby is getting enough milk if:   Your baby is actively sucking and you hear swallowing.   Your baby seems relaxed and satisfied after a feeding.   Your baby nurses at least 8 12 times in a 24 hour time period.  During the first 3 5 days of age:  Your baby is wetting at least 3 5 diapers in a 24 hour period. The urine should be clear and pale yellow.  Your baby is having at least 3 4 stools in a 24 hour period. The stool should be soft and yellow.  At 5 7 days of age, your baby is having at least 3 6 stools in a 24 hour period. The stool should be seedy and yellow by 5 days of age.  Your baby has a weight loss less than 7 10% during the first 3 days of age.  Your baby does not lose weight after 3 7 days of age.  Your baby gains 4 7 ounces each week after he or she is 4 days of age.  Your baby gains weight by 5 days of age and is back to birth weight within 2 weeks. ENGORGEMENT In the first week after your baby is born, you may experience extremely full breasts (engorgement). When engorged, your breasts may feel heavy, warm, or tender to the touch. Engorgement peaks within 24 48 hours after delivery of your baby.  Engorgement may be reduced by:  Continuing to breastfeed.  Increasing the frequency of breastfeeding.  Taking warm showers or   applying warm, moist heat to your breasts just before each feeding. This increases circulation and helps the milk flow.   Gently massaging your breast before and during the feedings. With your fingertips, massage from your chest wall towards your nipple in a circular motion.   Ensuring that your baby empties at least one breast at every feeding. It also helps to start the next feeding on the opposite breast.   Expressing breast milk by hand or by using a breast pump to  empty the breasts if your baby is sleepy, or not nursing well. You may also want to express milk if you are returning to work oryou feel you are getting engorged.  Ensuring your baby is latched on and positioned properly while breastfeeding. If you follow these suggestions, your engorgement should improve in 24 48 hours. If you are still experiencing difficulty, call your lactation consultant or caregiver.  CARING FOR YOURSELF Take care of your breasts.  Bathe or shower daily.   Avoid using soap on your nipples.   Wear a supportive bra. Avoid wearing underwire style bras.  Air dry your nipples for a 3 4minutes after each feeding.   Use only cotton bra pads to absorb breast milk leakage. Leaking of breast milk between feedings is normal.   Use only pure lanolin on your nipples after nursing. You do not need to wash it off before feeding your baby again. Another option is to express a few drops of breast milk and gently massage that milk into your nipples.  Continue breast self-awareness checks. Take care of yourself.  Eat healthy foods. Alternate 3 meals with 3 snacks.  Avoid foods that you notice affect your baby in a bad way.  Drink milk, fruit juice, and water to satisfy your thirst (about 8 glasses a day).   Rest often, relax, and take your prenatal vitamins to prevent fatigue, stress, and anemia.  Avoid chewing and smoking tobacco.  Avoid alcohol and drug use.  Take over-the-counter and prescribed medicine only as directed by your caregiver or pharmacist. You should always check with your caregiver or pharmacist before taking any new medicine, vitamin, or herbal supplement.  Know that pregnancy is possible while breastfeeding. If desired, talk to your caregiver about family planning and safe birth control methods that may be used while breastfeeding. SEEK MEDICAL CARE IF:   You feel like you want to stop breastfeeding or have become frustrated with  breastfeeding.  You have painful breasts or nipples.  Your nipples are cracked or bleeding.  Your breasts are red, tender, or warm.  You have a swollen area on either breast.  You have a fever or chills.  You have nausea or vomiting.  You have drainage from your nipples.  Your breasts do not become full before feedings by the 5th day after delivery.  You feel sad and depressed.  Your baby is too sleepy to eat well.  Your baby is having trouble sleeping.   Your baby is wetting less than 3 diapers in a 24 hour period.  Your baby has less than 3 stools in a 24 hour period.  Your baby's skin or the white part of his or her eyes becomes more yellow.   Your baby is not gaining weight by 5 days of age. MAKE SURE YOU:   Understand these instructions.  Will watch your condition.  Will get help right away if you are not doing well or get worse. Document Released: 09/23/2005 Document Revised: 06/17/2012 Document Reviewed:   04/29/2012 [employer] Patient Information 2014 [employer].  

## 2013-05-07 NOTE — Progress Notes (Signed)
[id number] 

## 2013-05-07 NOTE — Progress Notes (Signed)
   Subjective:    Gloria Dean is a G2P1001 [redacted]w[redacted]d being seen today for her first obstetrical visit.  Her obstetrical history is significant for short interval between pregnancies. Patient does intend to breast feed. Pregnancy history fully reviewed.  Patient reports no complaints.  Filed Vitals:   05/07/13 1034  BP: 109/66  Weight: 116 lb (52.617 kg)    HISTORY: OB History   Grav Para Term Preterm Abortions TAB SAB Ect Mult Living   2 1 1  0 0 0 0 0 0 1     # Outc Date GA Lbr Len/2nd Wgt Sex Del Anes PTL Lv   1 TRM 5/13 [redacted]w[redacted]d 09:35 / 00:14 5lb6.8oz(2.46kg) M SVD None  Yes   2 CUR              Past Medical History  Diagnosis Date  . Abnormal Pap smear 2010  . Infection     chylamidia  . Asthma    Past Surgical History  Procedure Laterality Date  . Wisdom tooth extraction     Family History  Problem Relation Age of Onset  . Cancer Maternal Grandfather     lung cancer     Exam    Uterus:   10 wk size  Pelvic Exam:    Perineum: Normal Perineum   Vulva: Bartholin's, Urethra, Skene's normal   Vagina:  normal mucosa, normal discharge   Cervix: no cervical motion tenderness and no lesions   Adnexa: no mass, fullness, tenderness   Bony Pelvis: average  System: Breast:  normal appearance, no masses or tenderness   Skin: normal coloration and turgor, no rashes    Neurologic: normal   Extremities: normal strength, tone, and muscle mass   HEENT sclera clear, anicteric   Mouth/Teeth mucous membranes moist, pharynx normal without lesions   Neck supple   Cardiovascular: regular rate and rhythm, no murmurs or gallops   Respiratory:  appears well, vitals normal, no respiratory distress, acyanotic, normal RR, ear and throat exam is normal, neck free of mass or lymphadenopathy, chest clear, no wheezing, crepitations, rhonchi, normal symmetric air entry   Abdomen: soft, non-tender; bowel sounds normal; no masses,  no organomegaly      Assessment:    Pregnancy:  G2P1001 Patient Active Problem List   Diagnosis Date Noted  . Supervision of other normal pregnancy 05/07/2013  . Genital warts 12/17/2011  . ASCUS +HRHPV in pregnancy 09/05/2011  . Asthma with allergic rhinitis 08/07/2011        Plan:     Initial labs drawn. Prenatal vitamins. Problem list reviewed and updated. Genetic Screening discussed First Screen: ordered.  Ultrasound discussed; fetal survey: requested.  Follow up in 4 weeks. New OB labs reviewed and WNL.  H/o abnl pap--for repeat with HPV typing.   Randolf Sansoucie S 05/07/2013

## 2013-05-08 DIAGNOSIS — O26899 Other specified pregnancy related conditions, unspecified trimester: Secondary | ICD-10-CM | POA: Insufficient documentation

## 2013-05-08 DIAGNOSIS — Z6791 Unspecified blood type, Rh negative: Secondary | ICD-10-CM | POA: Insufficient documentation

## 2013-05-13 LAB — CYSTIC FIBROSIS MUTATION 97

## 2013-05-18 LAB — SPECIMEN STATUS REPORT

## 2013-05-21 ENCOUNTER — Ambulatory Visit (HOSPITAL_COMMUNITY)
Admission: RE | Admit: 2013-05-21 | Discharge: 2013-05-21 | Disposition: A | Discharge status: Home / Self Care | Payer: 59 | Source: Ambulatory Visit | Attending: Family Medicine | Admitting: Family Medicine

## 2013-05-21 ENCOUNTER — Other Ambulatory Visit: Payer: Self-pay

## 2013-05-21 DIAGNOSIS — O3510X0 Maternal care for (suspected) chromosomal abnormality in fetus, unspecified, not applicable or unspecified: Secondary | ICD-10-CM | POA: Insufficient documentation

## 2013-05-21 DIAGNOSIS — Z3481 Encounter for supervision of other normal pregnancy, first trimester: Secondary | ICD-10-CM

## 2013-05-21 DIAGNOSIS — Z3689 Encounter for other specified antenatal screening: Secondary | ICD-10-CM | POA: Insufficient documentation

## 2013-05-21 DIAGNOSIS — O351XX0 Maternal care for (suspected) chromosomal abnormality in fetus, not applicable or unspecified: Secondary | ICD-10-CM | POA: Insufficient documentation

## 2013-06-04 ENCOUNTER — Ambulatory Visit (INDEPENDENT_AMBULATORY_CARE_PROVIDER_SITE_OTHER): Payer: 59 | Admitting: Family Medicine

## 2013-06-04 ENCOUNTER — Encounter: Payer: Self-pay | Admitting: Family Medicine

## 2013-06-04 VITALS — BP 130/82 | Wt 119.0 lb

## 2013-06-04 DIAGNOSIS — Z3482 Encounter for supervision of other normal pregnancy, second trimester: Secondary | ICD-10-CM

## 2013-06-04 DIAGNOSIS — Z3480 Encounter for supervision of other normal pregnancy, unspecified trimester: Secondary | ICD-10-CM

## 2013-06-04 DIAGNOSIS — O3440 Maternal care for other abnormalities of cervix, unspecified trimester: Secondary | ICD-10-CM

## 2013-06-04 NOTE — Patient Instructions (Signed)
Pregnancy - Second Trimester The second trimester of pregnancy (3 to 6 months) is a period of rapid growth for you and your baby. At the end of the sixth month, your baby is about 9 inches long and weighs 1 1/2 pounds. You will begin to feel the baby move between 18 and 20 weeks of the pregnancy. This is called quickening. Weight gain is faster. A clear fluid (colostrum) may leak out of your breasts. You may feel small contractions of the womb (uterus). This is known as false labor or Braxton-Hicks contractions. This is like a practice for labor when the baby is ready to be born. Usually, the problems with morning sickness have usually passed by the end of your first trimester. Some women develop small dark blotches (called cholasma, mask of pregnancy) on their face that usually goes away after the baby is born. Exposure to the sun makes the blotches worse. Acne may also develop in some pregnant women and pregnant women who have acne, may find that it goes away. PRENATAL EXAMS  Blood work may continue to be done during prenatal exams. These tests are done to check on your health and the probable health of your baby. Blood work is used to follow your blood levels (hemoglobin). Anemia (low hemoglobin) is common during pregnancy. Iron and vitamins are given to help prevent this. You will also be checked for diabetes between 24 and 28 weeks of the pregnancy. Some of the previous blood tests may be repeated.  The size of the uterus is measured during each visit. This is to make sure that the baby is continuing to grow properly according to the dates of the pregnancy.  Your blood pressure is checked every prenatal visit. This is to make sure you are not getting toxemia.  Your urine is checked to make sure you do not have an infection, diabetes or protein in the urine.  Your weight is checked often to make sure gains are happening at the suggested rate. This is to ensure that both you and your baby are  growing normally.  Sometimes, an ultrasound is performed to confirm the proper growth and development of the baby. This is a test which bounces harmless sound waves off the baby so your caregiver can more accurately determine due dates. Sometimes, a test is done on the amniotic fluid surrounding the baby. This test is called an amniocentesis. The amniotic fluid is obtained by sticking a needle into the belly (abdomen). This is done to check the chromosomes in instances where there is a concern about possible genetic problems with the baby. It is also sometimes done near the end of pregnancy if an early delivery is required. In this case, it is done to help make sure the baby's lungs are mature enough for the baby to live outside of the womb. CHANGES OCCURING IN THE SECOND TRIMESTER OF PREGNANCY Your body goes through many changes during pregnancy. They vary from person to person. Talk to your caregiver about changes you notice that you are concerned about.  During the second trimester, you will likely have an increase in your appetite. It is normal to have cravings for certain foods. This varies from person to person and pregnancy to pregnancy.  Your lower abdomen will begin to bulge.  You may have to urinate more often because the uterus and baby are pressing on your bladder. It is also common to get more bladder infections during pregnancy. You can help this by drinking lots of fluids   and emptying your bladder before and after intercourse.  You may begin to get stretch marks on your hips, abdomen, and breasts. These are normal changes in the body during pregnancy. There are no exercises or medicines to take that prevent this change.  You may begin to develop swollen and bulging veins (varicose veins) in your legs. Wearing support hose, elevating your feet for 15 minutes, 3 to 4 times a day and limiting salt in your diet helps lessen the problem.  Heartburn may develop as the uterus grows and  pushes up against the stomach. Antacids recommended by your caregiver helps with this problem. Also, eating smaller meals 4 to 5 times a day helps.  Constipation can be treated with a stool softener or adding bulk to your diet. Drinking lots of fluids, and eating vegetables, fruits, and whole grains are helpful.  Exercising is also helpful. If you have been very active up until your pregnancy, most of these activities can be continued during your pregnancy. If you have been less active, it is helpful to start an exercise program such as walking.  Hemorrhoids may develop at the end of the second trimester. Warm sitz baths and hemorrhoid cream recommended by your caregiver helps hemorrhoid problems.  Backaches may develop during this time of your pregnancy. Avoid heavy lifting, wear low heal shoes, and practice good posture to help with backache problems.  Some pregnant women develop tingling and numbness of their hand and fingers because of swelling and tightening of ligaments in the wrist (carpel tunnel syndrome). This goes away after the baby is born.  As your breasts enlarge, you may have to get a bigger bra. Get a comfortable, cotton, support bra. Do not get a nursing bra until the last month of the pregnancy if you will be nursing the baby.  You may get a dark line from your belly button to the pubic area called the linea nigra.  You may develop rosy cheeks because of increase blood flow to the face.  You may develop spider looking lines of the face, neck, arms, and chest. These go away after the baby is born. HOME CARE INSTRUCTIONS   It is extremely important to avoid all smoking, herbs, alcohol, and unprescribed drugs during your pregnancy. These chemicals affect the formation and growth of the baby. Avoid these chemicals throughout the pregnancy to ensure the delivery of a healthy infant.  Most of your home care instructions are the same as suggested for the first trimester of your  pregnancy. Keep your caregiver's appointments. Follow your caregiver's instructions regarding medicine use, exercise, and diet.  During pregnancy, you are providing food for you and your baby. Continue to eat regular, well-balanced meals. Choose foods such as meat, fish, milk and other low fat dairy products, vegetables, fruits, and whole-grain breads and cereals. Your caregiver will tell you of the ideal weight gain.  A physical sexual relationship may be continued up until near the end of pregnancy if there are no other problems. Problems could include early (premature) leaking of amniotic fluid from the membranes, vaginal bleeding, abdominal pain, or other medical or pregnancy problems.  Exercise regularly if there are no restrictions. Check with your caregiver if you are unsure of the safety of some of your exercises. The greatest weight gain will occur in the last 2 trimesters of pregnancy. Exercise will help you:  Control your weight.  Get you in shape for labor and delivery.  Lose weight after you have the baby.  Wear   a good support or jogging bra for breast tenderness during pregnancy. This may help if worn during sleep. Pads or tissues may be used in the bra if you are leaking colostrum.  Do not use hot tubs, steam rooms or saunas throughout the pregnancy.  Wear your seat belt at all times when driving. This protects you and your baby if you are in an accident.  Avoid raw meat, uncooked cheese, cat litter boxes, and soil used by cats. These carry germs that can cause birth defects in the baby.  The second trimester is also a good time to visit your dentist for your dental health if this has not been done yet. Getting your teeth cleaned is okay. Use a soft toothbrush. Brush gently during pregnancy.  It is easier to leak urine during pregnancy. Tightening up and strengthening the pelvic muscles will help with this problem. Practice stopping your urination while you are going to the  bathroom. These are the same muscles you need to strengthen. It is also the muscles you would use as if you were trying to stop from passing gas. You can practice tightening these muscles up 10 times a set and repeating this about 3 times per day. Once you know what muscles to tighten up, do not perform these exercises during urination. It is more likely to contribute to an infection by backing up the urine.  Ask for help if you have financial, counseling, or nutritional needs during pregnancy. Your caregiver will be able to offer counseling for these needs as well as refer you for other special needs.  Your skin may become oily. If so, wash your face with mild soap, use non-greasy moisturizer and oil or cream based makeup. MEDICINES AND DRUG USE IN PREGNANCY  Take prenatal vitamins as directed. The vitamin should contain 1 milligram of folic acid. Keep all vitamins out of reach of children. Only a couple vitamins or tablets containing iron may be fatal to a baby or young child when ingested.  Avoid use of all medicines, including herbs, over-the-counter medicines, not prescribed or suggested by your caregiver. Only take over-the-counter or prescription medicines for pain, discomfort, or fever as directed by your caregiver. Do not use aspirin.  Let your caregiver also know about herbs you may be using.  Alcohol is related to a number of birth defects. This includes fetal alcohol syndrome. All alcohol, in any form, should be avoided completely. Smoking will cause low birth rate and premature babies.  Street or illegal drugs are very harmful to the baby. They are absolutely forbidden. A baby born to an addicted mother will be addicted at birth. The baby will go through the same withdrawal an adult does. SEEK MEDICAL CARE IF:  You have any concerns or worries during your pregnancy. It is better to call with your questions if you feel they cannot wait, rather than worry about them. SEEK IMMEDIATE  MEDICAL CARE IF:   An unexplained oral temperature above 102 F (38.9 C) develops, or as your caregiver suggests.  You have leaking of fluid from the vagina (birth canal). If leaking membranes are suspected, take your temperature and tell your caregiver of this when you call.  There is vaginal spotting, bleeding, or passing clots. Tell your caregiver of the amount and how many pads are used. Light spotting in pregnancy is common, especially following intercourse.  You develop a bad smelling vaginal discharge with a change in the color from clear to white.  You continue to feel   sick to your stomach (nauseated) and have no relief from remedies suggested. You vomit blood or coffee ground-like materials.  You lose more than 2 pounds of weight or gain more than 2 pounds of weight over 1 week, or as suggested by your caregiver.  You notice swelling of your face, hands, feet, or legs.  You get exposed to German measles and have never had them.  You are exposed to fifth disease or chickenpox.  You develop belly (abdominal) pain. Round ligament discomfort is a common non-cancerous (benign) cause of abdominal pain in pregnancy. Your caregiver still must evaluate you.  You develop a bad headache that does not go away.  You develop fever, diarrhea, pain with urination, or shortness of breath.  You develop visual problems, blurry, or double vision.  You fall or are in a car accident or any kind of trauma.  There is mental or physical violence at home. Document Released: 09/17/2001 Document Revised: 06/17/2012 Document Reviewed: 03/22/2009 [employer] Patient Information 2014 [employer].  Breastfeeding A change in hormones during your pregnancy causes growth of your breast tissue and an increase in number and size of milk ducts. The hormone prolactin allows proteins, sugars, and fats from your blood supply to make breast milk in your milk-producing glands. The hormone progesterone prevents  breast milk from being released before the birth of your baby. After the birth of your baby, your progesterone level decreases allowing breast milk to be released. Thoughts of your baby, as well as his or her sucking or crying, can stimulate the release of milk from the milk-producing glands. Deciding to breastfeed (nurse) is one of the best choices you can make for you and your baby. The information that follows gives a brief review of the benefits, as well as other important skills to know about breastfeeding. BENEFITS OF BREASTFEEDING For your baby  The first milk (colostrum) helps your baby's digestive system function better.   There are antibodies in your milk that help your baby fight off infections.   Your baby has a lower incidence of asthma, allergies, and sudden infant death syndrome (SIDS).   The nutrients in breast milk are better for your baby than infant formulas.  Breast milk improves your baby's brain development.   Your baby will have less gas, colic, and constipation.  Your baby is less likely to develop other conditions, such as childhood obesity, asthma, or diabetes mellitus. For you  Breastfeeding helps develop a very special bond between you and your baby.   Breastfeeding is convenient, always available at the correct temperature, and costs nothing.   Breastfeeding helps to burn calories and helps you lose the weight gained during pregnancy.   Breastfeeding makes your uterus contract back down to normal size faster and slows bleeding following delivery.   Breastfeeding mothers have a lower risk of developing osteoporosis or breast or ovarian cancer later in life.  BREASTFEEDING FREQUENCY  A healthy, full-term baby may breastfeed as often as every hour or space his or her feedings to every 3 hours. Breastfeeding frequency will vary from baby to baby.   Newborns should be fed no less than every 2 3 hours during the day and every 4 5 hours during the  night. You should breastfeed a minimum of 8 feedings in a 24 hour period.  Awaken your baby to breastfeed if it has been 3 4 hours since the last feeding.  Breastfeed when you feel the need to reduce the fullness of your breasts or when   your newborn shows signs of hunger. Signs that your baby may be hungry include:  Increased alertness or activity.  Stretching.  Movement of the head from side to side.  Movement of the head and opening of the mouth when the corner of the mouth or cheek is stroked (rooting).  Increased sucking sounds, smacking lips, cooing, sighing, or squeaking.  Hand-to-mouth movements.  Increased sucking of fingers or hands.  Fussing.  Intermittent crying.  Signs of extreme hunger will require calming and consoling before you try to feed your baby. Signs of extreme hunger may include:  Restlessness.  A loud, strong cry.  Screaming.  Frequent feeding will help you make more milk and will help prevent problems, such as sore nipples and engorgement of the breasts.  BREASTFEEDING   Whether lying down or sitting, be sure that the baby's abdomen is facing your abdomen.   Support your breast with 4 fingers under your breast and your thumb above your nipple. Make sure your fingers are well away from your nipple and your baby's mouth.   Stroke your baby's lips gently with your finger or nipple.   When your baby's mouth is open wide enough, place all of your nipple and as much of the colored area around your nipple (areola) as possible into your baby's mouth.  More areola should be visible above his or her upper lip than below his or her lower lip.  Your baby's tongue should be between his or her lower gum and your breast.  Ensure that your baby's mouth is correctly positioned around the nipple (latched). Your baby's lips should create a seal on your breast.  Signs that your baby has effectively latched onto your nipple include:  Tugging or sucking  without pain.  Swallowing heard between sucks.  Absent click or smacking sound.  Muscle movement above and in front of his or her ears with sucking.  Your baby must suck about 2 3 minutes in order to get your milk. Allow your baby to feed on each breast as long as he or she wants. Nurse your baby until he or she unlatches or falls asleep at the first breast, then offer the second breast.  Signs that your baby is full and satisfied include:  A gradual decrease in the number of sucks or complete cessation of sucking.  Falling asleep.  Extension or relaxation of his or her body.  Retention of a small amount of milk in his or her mouth.  Letting go of your breast by himself or herself.  Signs of effective breastfeeding in you include:  Breasts that have increased firmness, weight, and size prior to feeding.  Breasts that are softer after nursing.  Increased milk volume, as well as a change in milk consistency and color by the 5th day of breastfeeding.  Breast fullness relieved by breastfeeding.  Nipples are not sore, cracked, or bleeding.  If needed, break the suction by putting your finger into the corner of your baby's mouth and sliding your finger between his or her gums. Then, remove your breast from his or her mouth.  It is common for babies to spit up a small amount after a feeding.  Babies often swallow air during feeding. This can make babies fussy. Burping your baby between breasts can help with this.  Vitamin D supplements are recommended for babies who get only breast milk.  Avoid using a pacifier during your baby's first 4 6 weeks.  Avoid supplemental feedings of water, formula, or   juice in place of breastfeeding. Breast milk is all the food your baby needs. It is not necessary for your baby to have water or formula. Your breasts will make more milk if supplemental feedings are avoided during the early weeks. HOW TO TELL WHETHER YOUR BABY IS GETTING ENOUGH BREAST  MILK Wondering whether or not your baby is getting enough milk is a common concern among mothers. You can be assured that your baby is getting enough milk if:   Your baby is actively sucking and you hear swallowing.   Your baby seems relaxed and satisfied after a feeding.   Your baby nurses at least 8 12 times in a 24 hour time period.  During the first 3 5 days of age:  Your baby is wetting at least 3 5 diapers in a 24 hour period. The urine should be clear and pale yellow.  Your baby is having at least 3 4 stools in a 24 hour period. The stool should be soft and yellow.  At 5 7 days of age, your baby is having at least 3 6 stools in a 24 hour period. The stool should be seedy and yellow by 5 days of age.  Your baby has a weight loss less than 7 10% during the first 3 days of age.  Your baby does not lose weight after 3 7 days of age.  Your baby gains 4 7 ounces each week after he or she is 4 days of age.  Your baby gains weight by 5 days of age and is back to birth weight within 2 weeks. ENGORGEMENT In the first week after your baby is born, you may experience extremely full breasts (engorgement). When engorged, your breasts may feel heavy, warm, or tender to the touch. Engorgement peaks within 24 48 hours after delivery of your baby.  Engorgement may be reduced by:  Continuing to breastfeed.  Increasing the frequency of breastfeeding.  Taking warm showers or applying warm, moist heat to your breasts just before each feeding. This increases circulation and helps the milk flow.   Gently massaging your breast before and during the feedings. With your fingertips, massage from your chest wall towards your nipple in a circular motion.   Ensuring that your baby empties at least one breast at every feeding. It also helps to start the next feeding on the opposite breast.   Expressing breast milk by hand or by using a breast pump to empty the breasts if your baby is sleepy, or  not nursing well. You may also want to express milk if you are returning to work oryou feel you are getting engorged.  Ensuring your baby is latched on and positioned properly while breastfeeding. If you follow these suggestions, your engorgement should improve in 24 48 hours. If you are still experiencing difficulty, call your lactation consultant or caregiver.  CARING FOR YOURSELF Take care of your breasts.  Bathe or shower daily.   Avoid using soap on your nipples.   Wear a supportive bra. Avoid wearing underwire style bras.  Air dry your nipples for a 3 4minutes after each feeding.   Use only cotton bra pads to absorb breast milk leakage. Leaking of breast milk between feedings is normal.   Use only pure lanolin on your nipples after nursing. You do not need to wash it off before feeding your baby again. Another option is to express a few drops of breast milk and gently massage that milk into your nipples.  Continue   breast self-awareness checks. Take care of yourself.  Eat healthy foods. Alternate 3 meals with 3 snacks.  Avoid foods that you notice affect your baby in a bad way.  Drink milk, fruit juice, and water to satisfy your thirst (about 8 glasses a day).   Rest often, relax, and take your prenatal vitamins to prevent fatigue, stress, and anemia.  Avoid chewing and smoking tobacco.  Avoid alcohol and drug use.  Take over-the-counter and prescribed medicine only as directed by your caregiver or pharmacist. You should always check with your caregiver or pharmacist before taking any new medicine, vitamin, or herbal supplement.  Know that pregnancy is possible while breastfeeding. If desired, talk to your caregiver about family planning and safe birth control methods that may be used while breastfeeding. SEEK MEDICAL CARE IF:   You feel like you want to stop breastfeeding or have become frustrated with breastfeeding.  You have painful breasts or nipples.  Your  nipples are cracked or bleeding.  Your breasts are red, tender, or warm.  You have a swollen area on either breast.  You have a fever or chills.  You have nausea or vomiting.  You have drainage from your nipples.  Your breasts do not become full before feedings by the 5th day after delivery.  You feel sad and depressed.  Your baby is too sleepy to eat well.  Your baby is having trouble sleeping.   Your baby is wetting less than 3 diapers in a 24 hour period.  Your baby has less than 3 stools in a 24 hour period.  Your baby's skin or the white part of his or her eyes becomes more yellow.   Your baby is not gaining weight by 5 days of age. MAKE SURE YOU:   Understand these instructions.  Will watch your condition.  Will get help right away if you are not doing well or get worse. Document Released: 09/23/2005 Document Revised: 06/17/2012 Document Reviewed: 04/29/2012 [employer] Patient Information 2014 [employer].  

## 2013-06-04 NOTE — Progress Notes (Signed)
P=[id number] 

## 2013-06-04 NOTE — Progress Notes (Signed)
Doing well--nml first screen--Pap reviewed--for repeat colpo.

## 2013-06-18 ENCOUNTER — Encounter: Payer: 59 | Admitting: Obstetrics & Gynecology

## 2013-06-18 ENCOUNTER — Ambulatory Visit (INDEPENDENT_AMBULATORY_CARE_PROVIDER_SITE_OTHER): Payer: 59 | Admitting: Obstetrics & Gynecology

## 2013-06-18 ENCOUNTER — Encounter: Payer: Self-pay | Admitting: Obstetrics & Gynecology

## 2013-06-18 VITALS — BP 98/61 | Wt 120.0 lb

## 2013-06-18 DIAGNOSIS — Z23 Encounter for immunization: Secondary | ICD-10-CM

## 2013-06-18 DIAGNOSIS — Z3480 Encounter for supervision of other normal pregnancy, unspecified trimester: Secondary | ICD-10-CM

## 2013-06-18 NOTE — Addendum Note (Signed)
Addended by: Allie Bossier on: 06/18/2013 10:34 AM   Modules accepted: Orders

## 2013-06-18 NOTE — Patient Instructions (Signed)
Avian Influenza Viruses °Avian influenza or "bird flu" is also known as H5N1 virus. It occurs naturally in wild and domestic birds. Bird flu is easily spread (contagious) among birds and is deadly to them. Though rare, bird flu can cause disease in humans.  °CAUSES  °Infected birds can shed the H5N1 virus through:  °· Feces. °· Nasal secretions. °· Saliva. °Birds become infected when they come into contact with infected birds or contaminated surfaces. The bird flu virus is spread from country to country through international poultry trade or by migrating birds.  °MODES OF TRANSMISSION TO HUMANS °The bird flu virus does not normally infect humans. However, the virus can infect humans who have contact with infected birds, breath in dust or touch surfaces contaminated with the virus. Human to human transmission of the H5N1 virus has been rare. The virus lacks the ability to grow itself (replicate) in humans. However, because all influenza viruses can mutate, scientists are concerned the H5N1 virus will someday replicate itself and make human to human transmission easier. If this happens, an influenza "pandemic" could occur.  °SYMPTOMS  °· Symptoms of H5N1 virus are similar to other influenza viruses: °· Fever. °· Cough. °· Sore throat. °· Nausea and vomiting. °· Diarrhea. °· Muscle aches. °· Tiredness (malaise). °· Some people may get inflammation or redness of the eyes (conjunctivitis). °· Life-threatening complications may result in the death of the patient. These include: °· Viral pneumonia. °· Breathing (respiratory) distress syndrome. °· Multi-organ failure. °DIAGNOSIS  °· A person with a respiratory illness may be suffering from bird flu if direct or indirect contact has been made with infected birds. This includes handling or taking care of sick birds. The H5N1 virus may also be suspected if a person has breathed in particles or touched surfaces contaminated with the virus. °· In addition to the above symptoms,  a chest X-ray is useful to detect pneumonia. °· Fluid specimens such as a sputum sample may be sent to a laboratory for further investigation. °· Blood tests may be be done to help detect the H5N1 virus. °TREATMENT  °· The H5N1 virus has shown resistance to amantadine and rimantadine, which are two antiviral drugs commonly used for other influenza viruses. However, two other antivirals, oseltamavir and zanamavir, seem to be effective against the H5N1 strain. °· If bird flu is suspected in a person, treatment should start immediately without waiting for laboratory confirmation. °· Treatment for the H5N1 strain is essentially the same as treating other influenza viruses. °PREVENTION  °· Stay home from work, school and errands when you are sick. Not being in contact with other people will help stop the spread of illness. °· Cover your mouth and nose with your arm when coughing or sneezing. This may help those around you from getting sick. °· Wash your hands often with warm water and soap. Illnesses are often spread when a person touches something that is contaminated with germs and then touches his or her eyes, nose, or mouth. °· Antiviral medications can help prevent the flu. °· For optimal health, get plenty of rest, eat a healthy diet and exercise. °Document Released: 09/26/2003 Document Revised: 12/16/2011 Document Reviewed: 04/21/2008 °Exit[employer]® Patient Information ©2014 [employer]. ° °

## 2013-06-18 NOTE — Progress Notes (Signed)
Flu vaccine today and MSAFP.

## 2013-06-18 NOTE — Progress Notes (Signed)
Routine visit for colposcopy but the colposcope is not functioning. She will get this done at either [facility] or [place]. No OB problems. Her anatomy scan is next week. She feels occasional movement.

## 2013-06-22 LAB — ALPHA FETOPROTEIN, MATERNAL
AFP: 49.9 IU/mL
Curr Gest Age: 17.3 wks.days
MoM for AFP: 1.21
Open Spina bifida: NEGATIVE
Osb Risk: 1:6630 {titer}

## 2013-06-28 ENCOUNTER — Ambulatory Visit (HOSPITAL_COMMUNITY): Payer: 59

## 2013-06-29 ENCOUNTER — Other Ambulatory Visit: Payer: Self-pay | Admitting: Family Medicine

## 2013-06-29 ENCOUNTER — Ambulatory Visit (HOSPITAL_COMMUNITY)
Admission: RE | Admit: 2013-06-29 | Discharge: 2013-06-29 | Disposition: A | Discharge status: Home / Self Care | Payer: 59 | Source: Ambulatory Visit | Attending: Family Medicine | Admitting: Family Medicine

## 2013-06-29 ENCOUNTER — Encounter: Payer: Self-pay | Admitting: Family Medicine

## 2013-06-29 DIAGNOSIS — Z3482 Encounter for supervision of other normal pregnancy, second trimester: Secondary | ICD-10-CM

## 2013-06-29 DIAGNOSIS — Z3689 Encounter for other specified antenatal screening: Secondary | ICD-10-CM | POA: Insufficient documentation

## 2013-07-20 ENCOUNTER — Ambulatory Visit (INDEPENDENT_AMBULATORY_CARE_PROVIDER_SITE_OTHER): Payer: 59 | Admitting: Obstetrics & Gynecology

## 2013-07-20 VITALS — BP 114/66 | Wt 128.0 lb

## 2013-07-20 DIAGNOSIS — Z3482 Encounter for supervision of other normal pregnancy, second trimester: Secondary | ICD-10-CM

## 2013-07-20 DIAGNOSIS — Z3480 Encounter for supervision of other normal pregnancy, unspecified trimester: Secondary | ICD-10-CM

## 2013-07-20 NOTE — Progress Notes (Signed)
Having thick discharge.  No itching or odor associated with the discharge.  Some left hip pain.

## 2013-07-20 NOTE — Progress Notes (Signed)
Reassured that discharge can get increased or thicker in pregnancy, no evaluation needed unless it has abnormal odor, color or associated with pruritis or irritation.  Normal anatomy scan.  Patient will get dental cleaning soon, she was told this was okay in pregnancy, note given to her to give to her dentist.  Patient is concerned about history of preeclampsia with previous pregnancy; she was counseled about new studies that suggest that patients with a previous history of preeclampsia can take low dose aspirin (81mg ) daily after first trimester to help prevent preeclampsia. She was told that this is not currently standard of care, and not recommended by ACOG or [facility] in this instance.  ACOG does suggest initiating the administration of daily low-dose aspirin in the late first trimester for women with a medical history of early-onset preeclampsia and preterm delivery at less than 34 0/7 weeks of gestation, or preeclampsia in more than one prior pregnancy. There is no evidence of acute risk of low dose aspirin to the fetus, although long-term fetal effects cannot be excluded. Patient will think about this, do her research and make her decision.   No other complaints or concerns.  Routine obstetric precautions reviewed.

## 2013-07-20 NOTE — Patient Instructions (Signed)
Return to clinic for any obstetric concerns or go to [facility] for evaluation  

## 2013-08-04 ENCOUNTER — Encounter: Payer: Self-pay | Admitting: *Deleted

## 2013-08-04 ENCOUNTER — Encounter: Payer: Self-pay | Admitting: Obstetrics & Gynecology

## 2013-08-04 ENCOUNTER — Ambulatory Visit (INDEPENDENT_AMBULATORY_CARE_PROVIDER_SITE_OTHER): Payer: 59 | Admitting: Obstetrics & Gynecology

## 2013-08-04 VITALS — BP 100/67 | HR 80 | Temp 98.1°F | Ht 63.0 in | Wt 129.4 lb

## 2013-08-04 DIAGNOSIS — R87810 Cervical high risk human papillomavirus (HPV) DNA test positive: Secondary | ICD-10-CM

## 2013-08-04 DIAGNOSIS — R87610 Atypical squamous cells of undetermined significance on cytologic smear of cervix (ASC-US): Secondary | ICD-10-CM

## 2013-08-04 NOTE — Progress Notes (Signed)
Here for colposcopy. Feels baby move good .

## 2013-08-04 NOTE — Progress Notes (Signed)
  Subjective:    Patient ID: Gloria Dean, female    DOB: [birth year], 27 y.o.   MRN: [id number]  HPI  Ms. Offer is [redacted] weeks EGA and normally receives her care at the [facility] office. She is here in the gyn clinic for a colposcopy for a recurrent pap showing ASCUS +HR HPV DNA. She reports good FM and denies any problems.  Review of Systems     Objective:   Physical Exam  Consent signed, time out done Cervix prepped with acetic acid. Transformation zone seen in its entirety. Colpo adequate. Entirely normal findings She tolerated the procedure well.       Assessment & Plan:  RTC at [facility] in 2 weeks for glucola, TDAP, and labs

## 2013-08-04 NOTE — Patient Instructions (Signed)
Colposcopy Colposcopy is a procedure that uses a special lighted microscope (colposcope). It examines your cervix and vagina, or the area around the outside of the vagina, for signs of disease or abnormalities in the cells. You may be sent to a specialist (gynecologist) to do the colposcopy. A biopsy (tissue sample) may be collected during a colposcopy, if the caregiver finds any unusual cells. The biopsy is sent to the lab for further testing, and the results are reported back to your caregiver. A WOMAN MAY NEED THIS PROCEDURE IF:  She has had an abnormal pap smear (taking cells from the cervix for testing).  She has a sore on her cervix, and a Pap test was normal.  The Pap test suggests human papilloma virus (HPV). This virus can cause genital warts and is linked to the development of cervical cancer.  She has genital warts on the cervix, or in or around the outside of the vagina.  Her mother took the drug DES while pregnant.  She has painful intercourse.  She has vaginal bleeding, especially after sexual intercourse.  There is a need to evaluate the results of previous treatment. BEFORE THE PROCEDURE   Colposcopy is done when you are not having a menstrual period.  For 24 hours before the colposcopy, do not:  Douche.  Use tampons.  Use medicines, creams, or suppositories in the vagina.  Have sexual intercourse. PROCEDURE   A colposcopy is done while a woman is lying on her back with her feet in foot rests (stirrups).  A speculum is placed inside the vagina to keep it open and to allow the caregiver to see the cervix. This is the same instrument used to do a pap smear.  The colposcope is placed outside the vagina. It is used to magnify and examine the cervix, vagina, and the area around the outside of the vagina.  A small amount of liquid solution is placed on the area that is to be viewed. This solution is placed on with a cotton applicator. This solution makes it easier to  see the abnormal cells.  Your caregiver will suck out mucus and cells from the canal of the cervix.  Small pieces of tissue for biopsy may be taken at the same time. You may feel mild pain or discomfort when this is done.  Your caregiver will record the location of the abnormal areas and send the tissue samples to a lab for analysis.  If your caregiver biopsies the vagina or outside of the vagina, a local anesthetic (novocaine) is usually given. AFTER THE PROCEDURE   You may have some cramping that often goes away in a few minutes. You may have some soreness for a couple of days.  You may take over-the-counter pain medicine as advised by your caregiver. Do not take aspirin because it can cause bleeding.  Lie down for a few minutes if you feel lightheaded.  You may have some bleeding or dark discharge that should stop in a few days.  You may need to wear a sanitary pad for a few days. HOME CARE INSTRUCTIONS   Avoid sex, douching, and using tampons for a week or as directed.  Only take medicine as directed by your caregiver.  Continue to take birth control pills, if you are on them.  Not all test results are available during your visit. If your test results are not back during the visit, make an appointment with your caregiver to find out the results. Do not assume everything is   normal if you have not heard from your caregiver or the medical facility. It is important for you to follow up on all of your test results.  Follow your caregiver's advice regarding medicines, activity, follow-up visits, and follow-up Pap tests. SEEK MEDICAL CARE IF:   You develop a rash.  You have problems with your medicine. SEEK IMMEDIATE MEDICAL CARE IF:  You are bleeding heavily or are passing blood clots.  You develop a fever over 102 F (38.9 C), with or without chills.  You have abnormal vaginal discharge.  You are having cramps that do not go away after taking your pain medicine.  You  feel lightheaded, dizzy, or faint.  You develop stomach pain. Document Released: 12/14/2002 Document Revised: 12/16/2011 Document Reviewed: 07/27/2009 Exit[employer] Patient Information 2014 [employer].  

## 2013-08-19 ENCOUNTER — Encounter: Payer: Self-pay | Admitting: Obstetrics & Gynecology

## 2013-08-19 ENCOUNTER — Ambulatory Visit (INDEPENDENT_AMBULATORY_CARE_PROVIDER_SITE_OTHER): Payer: 59 | Admitting: Obstetrics & Gynecology

## 2013-08-19 VITALS — BP 113/63 | Wt 133.8 lb

## 2013-08-19 DIAGNOSIS — Z3480 Encounter for supervision of other normal pregnancy, unspecified trimester: Secondary | ICD-10-CM

## 2013-08-19 NOTE — Progress Notes (Signed)
Routine visit. Good FM. No problems. At next visit, she will need glucola, rhophylac, TDAP and labs. She has already had flu vaccine.

## 2013-08-19 NOTE — Progress Notes (Signed)
[id number]  

## 2013-08-25 IMAGING — US US OB COMP LESS 14 WK
1 series · 14 of 17 positions shown · non-contrast
Comparison: none

[Series 1: us ob transvaginal · 14 of 17 slices shown]
[im 1/17]
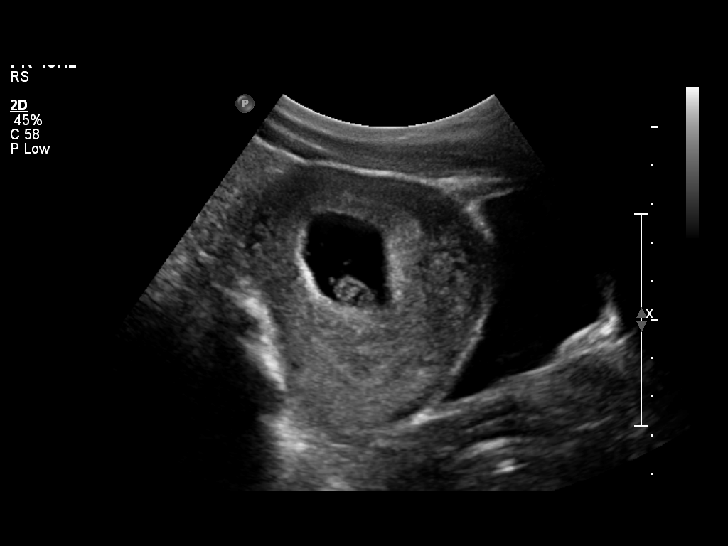
[im 2/17]
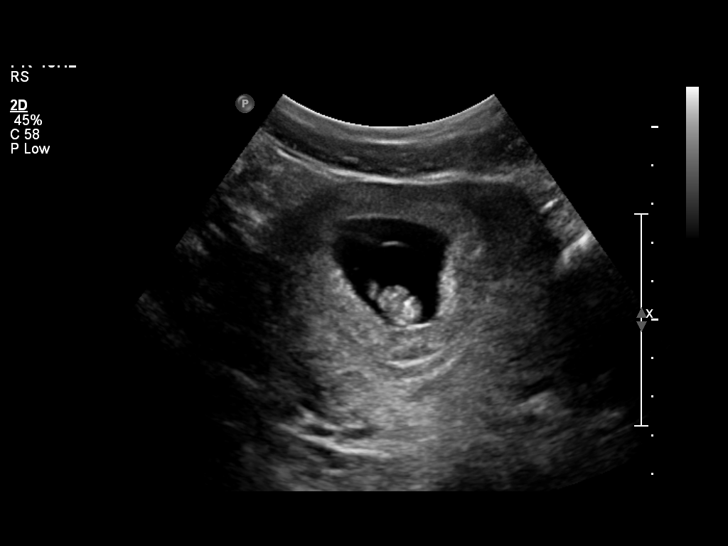
[im 4/17]
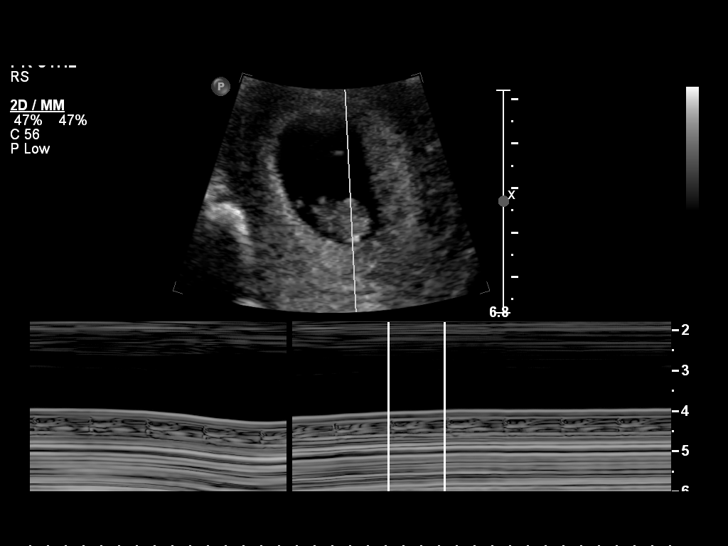
[im 5/17]
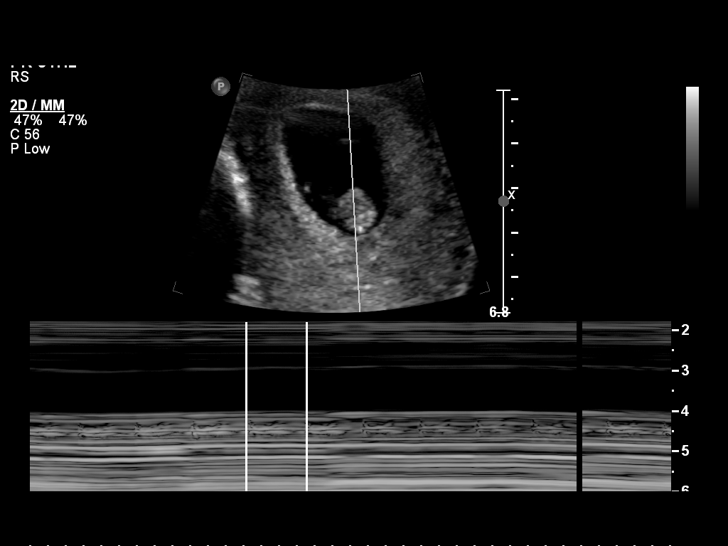
[im 6/17]
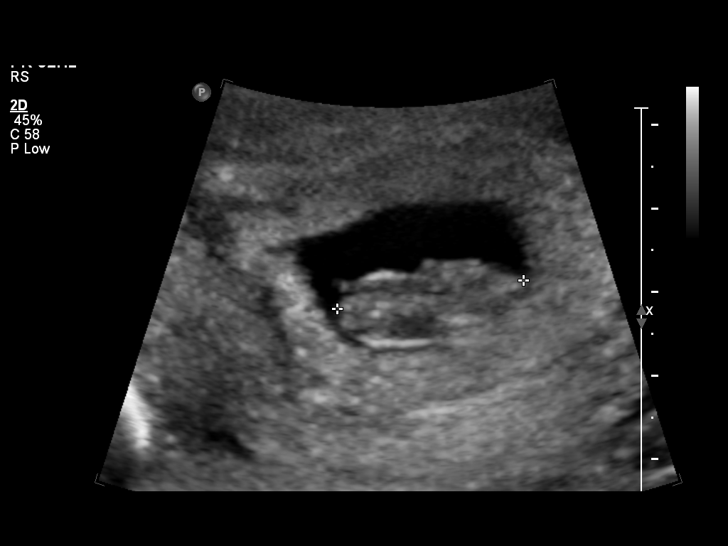
[im 7/17]
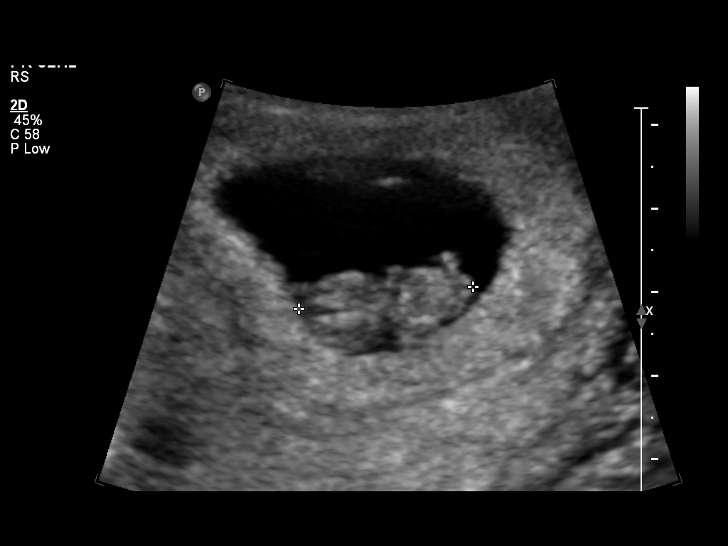
[im 8/17]
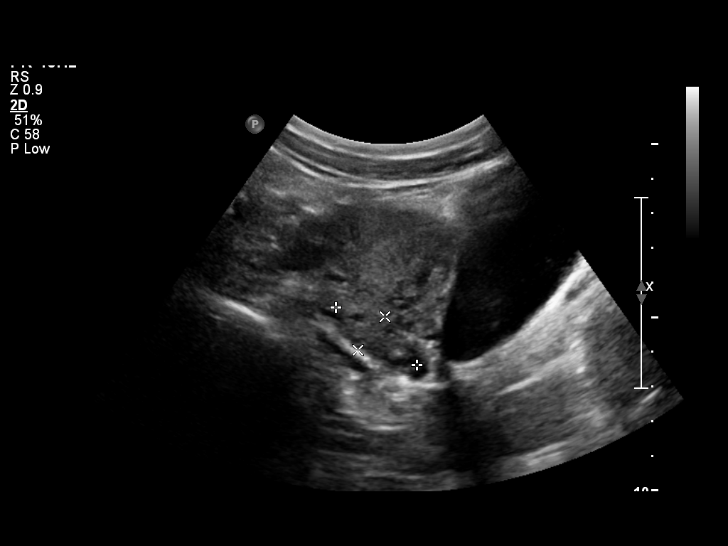
[im 10/17]
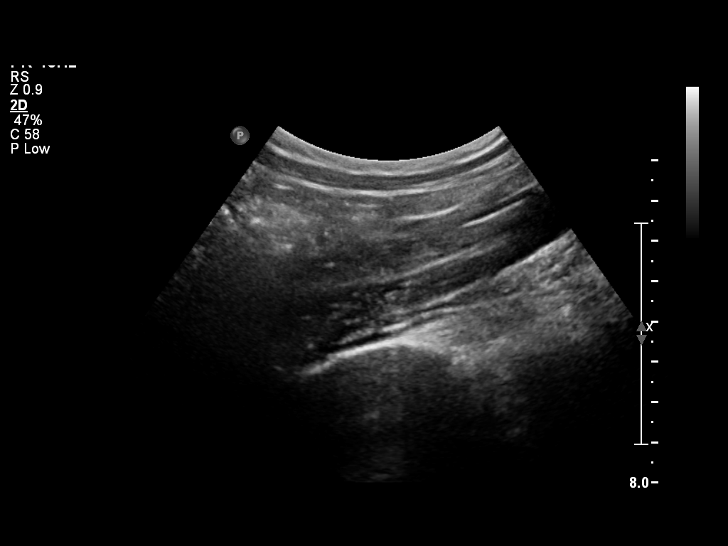
[im 11/17]
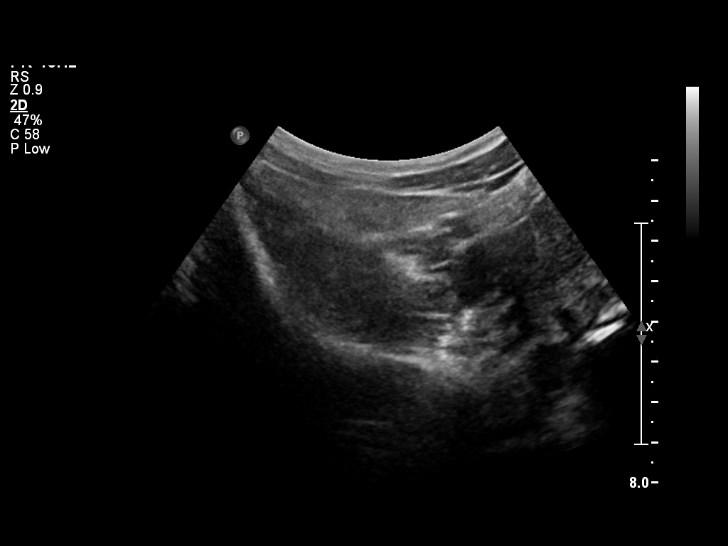
[im 12/17]
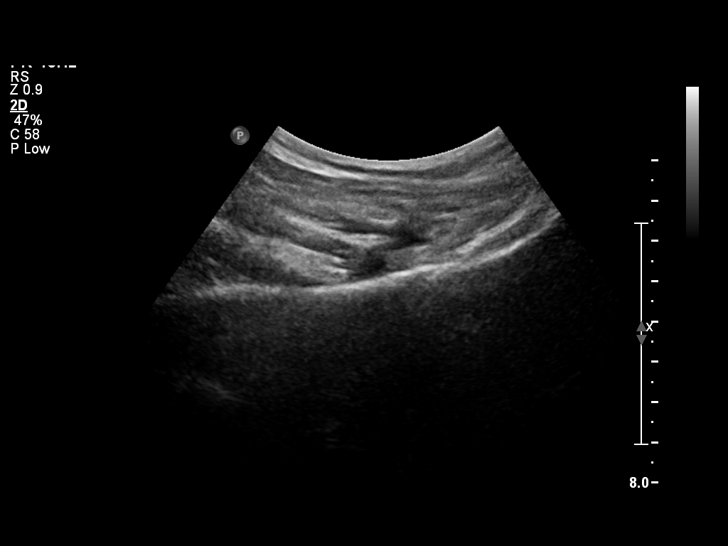
[im 13/17]
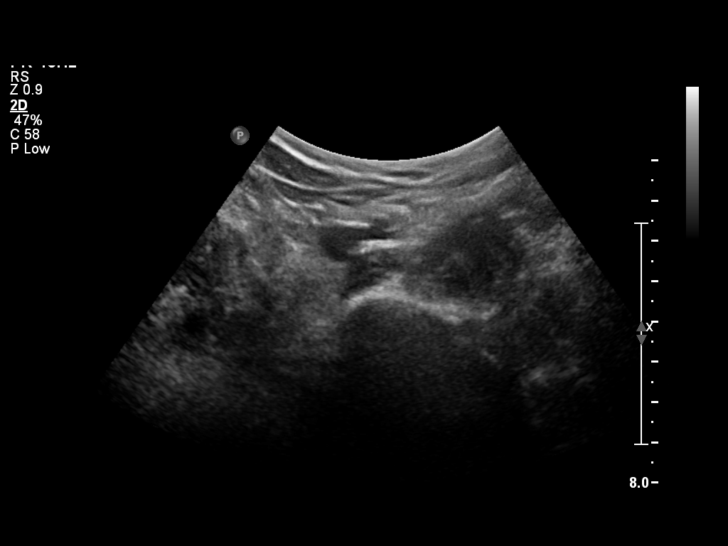
[im 14/17]
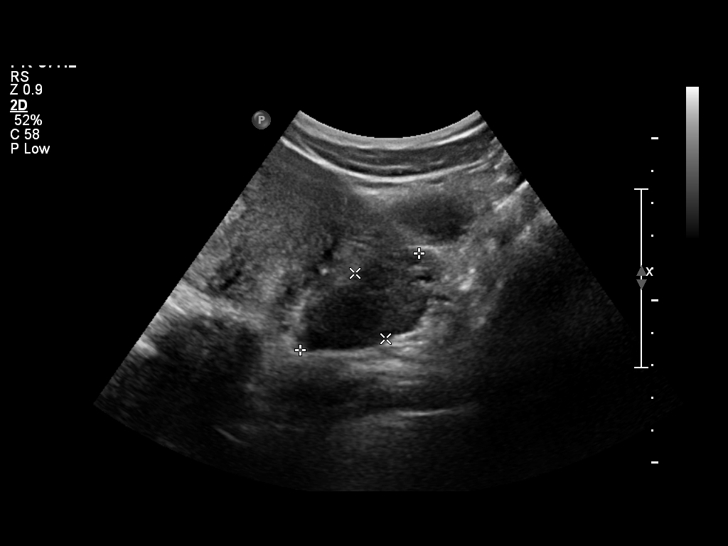
[im 16/17]
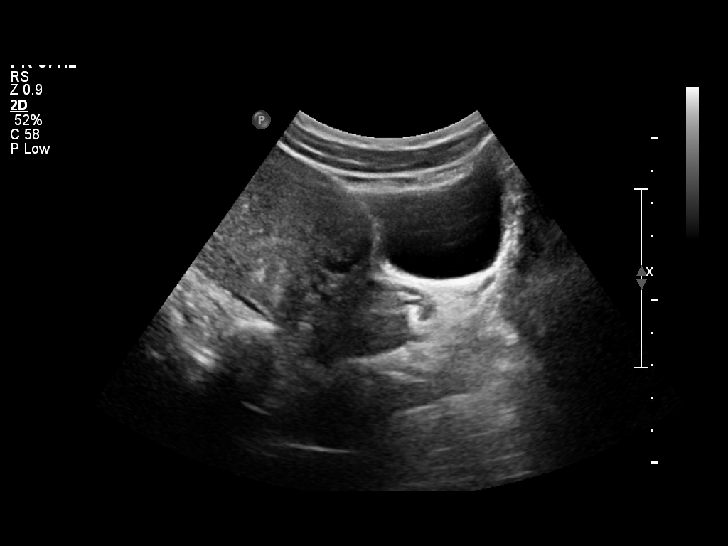
[im 17/17]
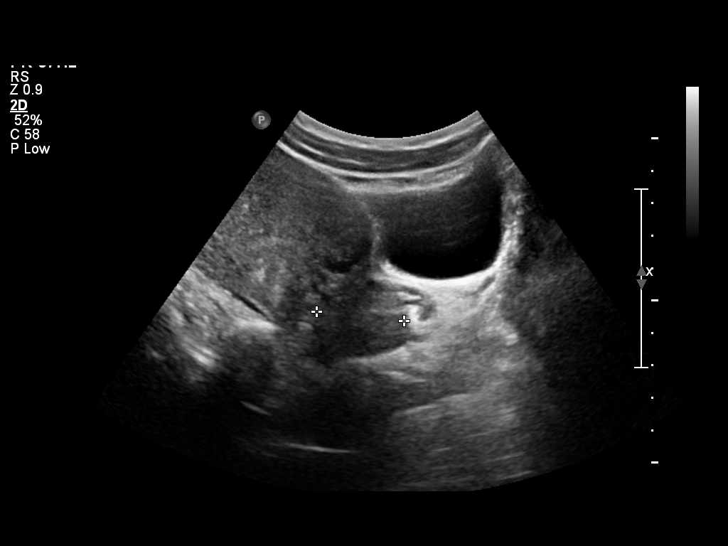

[14 of 17 positions shown; findings below may reference images not displayed]

OBSTETRICS REPORT
                      (Signed Final 07/31/2011 [DATE])

 Order#:         [id number]
Procedures

 US OB COMP LESS 14 WKS                                76801.0
Indications

 Uncertain LMP;  Establish Gestational [AGE]
Fetal Evaluation

 Yolk Sac:          Visualized
 Fetal Pole:        Visualized, intrauterine
 Fetal Heart Rate:  176                          bpm
 Cardiac Activity:  Observed

 Amniotic Fluid
 AFI FV:      Subjectively within normal limits
Biometry

 CRL:     21.8  mm     G. Age:  8w 5d                  EDD:    03/06/12
Gestational Age

 LMP:           8w 4d         Date:  06/01/11                 EDD:   03/07/12
 Best:          8w 4d      Det. By:  LMP  (06/01/11)          EDD:   03/07/12
Cervix Uterus Adnexa

 Cervix:       Closed.

 Left Ovary:    Within normal limits. Small corpus luteum noted.
 Right Ovary:   Within normal limits.
 Adnexa:     No abnormality visualized.
Impression

 Single living IUP.  US EGA is concordant with LMP.
 No significant maternal uterine or adnexal abnormality
 identified.

## 2013-09-07 ENCOUNTER — Encounter: Payer: Self-pay | Admitting: Family Medicine

## 2013-09-07 ENCOUNTER — Ambulatory Visit (INDEPENDENT_AMBULATORY_CARE_PROVIDER_SITE_OTHER): Payer: 59 | Admitting: Family Medicine

## 2013-09-07 VITALS — BP 104/69 | Wt 139.0 lb

## 2013-09-07 DIAGNOSIS — Z23 Encounter for immunization: Secondary | ICD-10-CM

## 2013-09-07 DIAGNOSIS — O360131 Maternal care for anti-D [Rh] antibodies, third trimester, fetus 1: Secondary | ICD-10-CM

## 2013-09-07 DIAGNOSIS — Z3480 Encounter for supervision of other normal pregnancy, unspecified trimester: Secondary | ICD-10-CM

## 2013-09-07 DIAGNOSIS — O360990 Maternal care for other rhesus isoimmunization, unspecified trimester, not applicable or unspecified: Secondary | ICD-10-CM

## 2013-09-07 DIAGNOSIS — Z3483 Encounter for supervision of other normal pregnancy, third trimester: Secondary | ICD-10-CM

## 2013-09-07 LAB — OB RESULTS CONSOLE HIV ANTIBODY (ROUTINE TESTING): HIV: NONREACTIVE

## 2013-09-07 LAB — OB RESULTS CONSOLE RPR: RPR: NONREACTIVE

## 2013-09-07 MED ORDER — RHO D IMMUNE GLOBULIN 1500 UNIT/2ML IJ SOLN
300.0000 ug | Freq: Once | INTRAMUSCULAR | Status: AC
  Administered 2013-09-07: 300 ug via INTRAMUSCULAR

## 2013-09-07 NOTE — Progress Notes (Signed)
RN only visit 

## 2013-09-07 NOTE — Patient Instructions (Signed)
Breastfeeding Deciding to breastfeed is one of the best choices you can make for you and your baby. A change in hormones during pregnancy causes your breast tissue to grow and increases the number and size of your milk ducts. These hormones also allow proteins, sugars, and fats from your blood supply to make breast milk in your milk-producing glands. Hormones prevent breast milk from being released before your baby is born as well as prompt milk flow after birth. Once breastfeeding has begun, thoughts of your baby, as well as his or her sucking or crying, can stimulate the release of milk from your milk-producing glands.  BENEFITS OF BREASTFEEDING For Your Baby  Your first milk (colostrum) helps your baby's digestive system function better.   There are antibodies in your milk that help your baby fight off infections.   Your baby has a lower incidence of asthma, allergies, and sudden infant death syndrome.   The nutrients in breast milk are better for your baby than infant formulas and are designed uniquely for your baby's needs.   Breast milk improves your baby's brain development.   Your baby is less likely to develop other conditions, such as childhood obesity, asthma, or type 2 diabetes mellitus.  For You   Breastfeeding helps to create a very special bond between you and your baby.   Breastfeeding is convenient. Breast milk is always available at the correct temperature and costs nothing.   Breastfeeding helps to burn calories and helps you lose the weight gained during pregnancy.   Breastfeeding makes your uterus contract to its prepregnancy size faster and slows bleeding (lochia) after you give birth.   Breastfeeding helps to lower your risk of developing type 2 diabetes mellitus, osteoporosis, and breast or ovarian cancer later in life. SIGNS THAT YOUR BABY IS HUNGRY Early Signs of Hunger  Increased alertness or activity.  Stretching.  Movement of the head from  side to side.  Movement of the head and opening of the mouth when the corner of the mouth or cheek is stroked (rooting).  Increased sucking sounds, smacking lips, cooing, sighing, or squeaking.  Hand-to-mouth movements.  Increased sucking of fingers or hands. Late Signs of Hunger  Fussing.  Intermittent crying. Extreme Signs of Hunger Signs of extreme hunger will require calming and consoling before your baby will be able to breastfeed successfully. Do not wait for the following signs of extreme hunger to occur before you initiate breastfeeding:   Restlessness.  A loud, strong cry.   Screaming. BREASTFEEDING BASICS Breastfeeding Initiation  Find a comfortable place to sit or lie down, with your neck and back well supported.  Place a pillow or rolled up blanket under your baby to bring him or her to the level of your breast (if you are seated). Nursing pillows are specially designed to help support your arms and your baby while you breastfeed.  Make sure that your baby's abdomen is facing your abdomen.   Gently massage your breast. With your fingertips, massage from your chest wall toward your nipple in a circular motion. This encourages milk flow. You may need to continue this action during the feeding if your milk flows slowly.  Support your breast with 4 fingers underneath and your thumb above your nipple. Make sure your fingers are well away from your nipple and your baby's mouth.   Stroke your baby's lips gently with your finger or nipple.   When your baby's mouth is open wide enough, quickly bring your baby to your   breast, placing your entire nipple and as much of the colored area around your nipple (areola) as possible into your baby's mouth.   More areola should be visible above your baby's upper lip than below the lower lip.   Your baby's tongue should be between his or her lower gum and your breast.   Ensure that your baby's mouth is correctly positioned  around your nipple (latched). Your baby's lips should create a seal on your breast and be turned out (everted).  It is common for your baby to suck about 2 3 minutes in order to start the flow of breast milk. Latching Teaching your baby how to latch on to your breast properly is very important. An improper latch can cause nipple pain and decreased milk supply for you and poor weight gain in your baby. Also, if your baby is not latched onto your nipple properly, he or she may swallow some air during feeding. This can make your baby fussy. Burping your baby when you switch breasts during the feeding can help to get rid of the air. However, teaching your baby to latch on properly is still the best way to prevent fussiness from swallowing air while breastfeeding. Signs that your baby has successfully latched on to your nipple:    Silent tugging or silent sucking, without causing you pain.   Swallowing heard between every 3 4 sucks.    Muscle movement above and in front of his or her ears while sucking.  Signs that your baby has not successfully latched on to nipple:   Sucking sounds or smacking sounds from your baby while breastfeeding.  Nipple pain. If you think your baby has not latched on correctly, slip your finger into the corner of your baby's mouth to break the suction and place it between your baby's gums. Attempt breastfeeding initiation again. Signs of Successful Breastfeeding Signs from your baby:   A gradual decrease in the number of sucks or complete cessation of sucking.   Falling asleep.   Relaxation of his or her body.   Retention of a small amount of milk in his or her mouth.   Letting go of your breast by himself or herself. Signs from you:  Breasts that have increased in firmness, weight, and size 1 3 hours after feeding.   Breasts that are softer immediately after breastfeeding.  Increased milk volume, as well as a change in milk consistency and color by  the 5th day of breastfeeding.   Nipples that are not sore, cracked, or bleeding. Signs That Your Baby is Getting Enough Milk  Wetting at least 3 diapers in a 24-hour period. The urine should be clear and pale yellow by age 5 days.  At least 3 stools in a 24-hour period by age 5 days. The stool should be soft and yellow.  At least 3 stools in a 24-hour period by age 7 days. The stool should be seedy and yellow.  No loss of weight greater than 10% of birth weight during the first 3 days of age.  Average weight gain of 4 7 ounces (120 210 mL) per week after age 4 days.  Consistent daily weight gain by age 5 days, without weight loss after the age of 2 weeks. After a feeding, your baby may spit up a small amount. This is common. BREASTFEEDING FREQUENCY AND DURATION Frequent feeding will help you make more milk and can prevent sore nipples and breast engorgement. Breastfeed when you feel the need to reduce   the fullness of your breasts or when your baby shows signs of hunger. This is called "breastfeeding on demand." Avoid introducing a pacifier to your baby while you are working to establish breastfeeding (the first 4 6 weeks after your baby is born). After this time you may choose to use a pacifier. Research has shown that pacifier use during the first year of a baby's life decreases the risk of sudden infant death syndrome (SIDS). Allow your baby to feed on each breast as long as he or she wants. Breastfeed until your baby is finished feeding. When your baby unlatches or falls asleep while feeding from the first breast, offer the second breast. Because newborns are often sleepy in the first few weeks of life, you may need to awaken your baby to get him or her to feed. Breastfeeding times will vary from baby to baby. However, the following rules can serve as a guide to help you ensure that your baby is properly fed:  Newborns (babies 4 weeks of age or younger) may breastfeed every 1 3  hours.  Newborns should not go longer than 3 hours during the day or 5 hours during the night without breastfeeding.  You should breastfeed your baby a minimum of 8 times in a 24-hour period until you begin to introduce solid foods to your baby at around 6 months of age. BREAST MILK PUMPING Pumping and storing breast milk allows you to ensure that your baby is exclusively fed your breast milk, even at times when you are unable to breastfeed. This is especially important if you are going back to work while you are still breastfeeding or when you are not able to be present during feedings. Your lactation consultant can give you guidelines on how long it is safe to store breast milk.  A breast pump is a machine that allows you to pump milk from your breast into a sterile bottle. The pumped breast milk can then be stored in a refrigerator or freezer. Some breast pumps are operated by hand, while others use electricity. Ask your lactation consultant which type will work best for you. Breast pumps can be purchased, but some hospitals and breastfeeding support groups lease breast pumps on a monthly basis. A lactation consultant can teach you how to hand express breast milk, if you prefer not to use a pump.  CARING FOR YOUR BREASTS WHILE YOU BREASTFEED Nipples can become dry, cracked, and sore while breastfeeding. The following recommendations can help keep your breasts moisturized and healthy:  Avoid using soap on your nipples.   Wear a supportive bra. Although not required, special nursing bras and tank tops are designed to allow access to your breasts for breastfeeding without taking off your entire bra or top. Avoid wearing underwire style bras or extremely tight bras.  Air dry your nipples for 3 4minutes after each feeding.   Use only cotton bra pads to absorb leaked breast milk. Leaking of breast milk between feedings is normal.   Use lanolin on your nipples after breastfeeding. Lanolin helps to  maintain your skin's normal moisture barrier. If you use pure lanolin you do not need to wash it off before feeding your baby again. Pure lanolin is not toxic to your baby. You may also hand express a few drops of breast milk and gently massage that milk into your nipples and allow the milk to air dry. In the first few weeks after giving birth, some women experience extremely full breasts (engorgement). Engorgement can make   your breasts feel heavy, warm, and tender to the touch. Engorgement peaks within 3 5 days after you give birth. The following recommendations can help ease engorgement:  Completely empty your breasts while breastfeeding or pumping. You may want to start by applying warm, moist heat (in the shower or with warm water-soaked hand towels) just before feeding or pumping. This increases circulation and helps the milk flow. If your baby does not completely empty your breasts while breastfeeding, pump any extra milk after he or she is finished.  Wear a snug bra (nursing or regular) or tank top for 1 2 days to signal your body to slightly decrease milk production.  Apply ice packs to your breasts, unless this is too uncomfortable for you.  Make sure that your baby is latched on and positioned properly while breastfeeding. If engorgement persists after 48 hours of following these recommendations, contact your health care provider or a lactation consultant. OVERALL HEALTH CARE RECOMMENDATIONS WHILE BREASTFEEDING  Eat healthy foods. Alternate between meals and snacks, eating 3 of each per day. Because what you eat affects your breast milk, some of the foods may make your baby more irritable than usual. Avoid eating these foods if you are sure that they are negatively affecting your baby.  Drink milk, fruit juice, and water to satisfy your thirst (about 10 glasses a day).   Rest often, relax, and continue to take your prenatal vitamins to prevent fatigue, stress, and anemia.  Continue  breast self-awareness checks.  Avoid chewing and smoking tobacco.  Avoid alcohol and drug use. Some medicines that may be harmful to your baby can pass through breast milk. It is important to ask your health care provider before taking any medicine, including all over-the-counter and prescription medicine as well as vitamin and herbal supplements. It is possible to become pregnant while breastfeeding. If birth control is desired, ask your health care provider about options that will be safe for your baby. SEEK MEDICAL CARE IF:   You feel like you want to stop breastfeeding or have become frustrated with breastfeeding.  You have painful breasts or nipples.  Your nipples are cracked or bleeding.  Your breasts are red, tender, or warm.  You have a swollen area on either breast.  You have a fever or chills.  You have nausea or vomiting.  You have drainage other than breast milk from your nipples.  Your breasts do not become full before feedings by the 5th day after you give birth.  You feel sad and depressed.  Your baby is too sleepy to eat well.  Your baby is having trouble sleeping.   Your baby is wetting less than 3 diapers in a 24-hour period.  Your baby has less than 3 stools in a 24-hour period.  Your baby's skin or the white part of his or her eyes becomes yellow.   Your baby is not gaining weight by 5 days of age. SEEK IMMEDIATE MEDICAL CARE IF:   Your baby is overly tired (lethargic) and does not want to wake up and feed.  Your baby develops an unexplained fever. Document Released: 09/23/2005 Document Revised: 05/26/2013 Document Reviewed: 03/17/2013 [employer] Patient Information 2014 [employer].  

## 2013-09-07 NOTE — Progress Notes (Signed)
28 wks labs and Rhogam and TDaP today.

## 2013-09-07 NOTE — Progress Notes (Signed)
[id number] 

## 2013-09-08 ENCOUNTER — Encounter: Payer: Self-pay | Admitting: Family Medicine

## 2013-09-08 LAB — CBC
HCT: 34.5 % (ref 34.0–46.6)
Hemoglobin: 11.7 g/dL (ref 11.1–15.9)
MCH: 30.4 pg (ref 26.6–33.0)
MCHC: 33.9 g/dL (ref 31.5–35.7)
MCV: 90 fL (ref 79–97)
Platelets: 214 10*3/uL (ref 150–379)
RBC: 3.85 x10E6/uL (ref 3.77–5.28)
RDW: 13.4 % (ref 12.3–15.4)
WBC: 10.3 10*3/uL (ref 3.4–10.8)

## 2013-09-08 LAB — GLUCOSE TOLERANCE, 1 HOUR: Glucose, 1Hr PP: 92 mg/dL (ref 65–199)

## 2013-09-08 LAB — ANTIBODY SCREEN: Antibody Screen: NEGATIVE

## 2013-09-08 LAB — RPR: RPR: NONREACTIVE

## 2013-09-08 LAB — HIV ANTIBODY (ROUTINE TESTING W REFLEX)
HIV 1/O/2 Abs-Index Value: <1 (ref <1.00)
HIV-1/HIV-2 Ab: NONREACTIVE

## 2013-09-15 NOTE — Progress Notes (Signed)
Error- NOS

## 2013-09-21 ENCOUNTER — Encounter: Payer: Self-pay | Admitting: Obstetrics & Gynecology

## 2013-09-21 ENCOUNTER — Ambulatory Visit (INDEPENDENT_AMBULATORY_CARE_PROVIDER_SITE_OTHER): Payer: 59 | Admitting: Obstetrics & Gynecology

## 2013-09-21 VITALS — BP 114/65 | Wt 140.2 lb

## 2013-09-21 DIAGNOSIS — Z3483 Encounter for supervision of other normal pregnancy, third trimester: Secondary | ICD-10-CM

## 2013-09-21 DIAGNOSIS — O360990 Maternal care for other rhesus isoimmunization, unspecified trimester, not applicable or unspecified: Secondary | ICD-10-CM

## 2013-09-21 DIAGNOSIS — Z3480 Encounter for supervision of other normal pregnancy, unspecified trimester: Secondary | ICD-10-CM

## 2013-09-21 DIAGNOSIS — O360131 Maternal care for anti-D [Rh] antibodies, third trimester, fetus 1: Secondary | ICD-10-CM

## 2013-09-21 NOTE — Progress Notes (Signed)
[id number] 

## 2013-09-21 NOTE — Patient Instructions (Signed)
Return to clinic for any obstetric concerns or go to [facility] for evaluation  

## 2013-09-21 NOTE — Progress Notes (Signed)
Normal GTT and labs.  No other complaints or concerns.  Fetal movement and labor precautions reviewed.

## 2013-10-05 ENCOUNTER — Ambulatory Visit (INDEPENDENT_AMBULATORY_CARE_PROVIDER_SITE_OTHER): Payer: 59 | Admitting: Family Medicine

## 2013-10-05 VITALS — BP 123/68 | Wt 143.0 lb

## 2013-10-05 DIAGNOSIS — Z3480 Encounter for supervision of other normal pregnancy, unspecified trimester: Secondary | ICD-10-CM

## 2013-10-05 DIAGNOSIS — Z3483 Encounter for supervision of other normal pregnancy, third trimester: Secondary | ICD-10-CM

## 2013-10-05 NOTE — Patient Instructions (Signed)
Third Trimester of Pregnancy The third trimester is from week 29 through week 42, months 7 through 9. The third trimester is a time when the fetus is growing rapidly. At the end of the ninth month, the fetus is about 20 inches in length and weighs 6 10 pounds.  BODY CHANGES Your body goes through many changes during pregnancy. The changes vary from woman to woman.   Your weight will continue to increase. You can expect to gain 25 35 pounds (11 16 kg) by the end of the pregnancy.  You may begin to get stretch marks on your hips, abdomen, and breasts.  You may urinate more often because the fetus is moving lower into your pelvis and pressing on your bladder.  You may develop or continue to have heartburn as a result of your pregnancy.  You may develop constipation because certain hormones are causing the muscles that push waste through your intestines to slow down.  You may develop hemorrhoids or swollen, bulging veins (varicose veins).  You may have pelvic pain because of the weight gain and pregnancy hormones relaxing your joints between the bones in your pelvis. Back aches may result from over exertion of the muscles supporting your posture.  Your breasts will continue to grow and be tender. A yellow discharge may leak from your breasts called colostrum.  Your belly button may stick out.  You may feel short of breath because of your expanding uterus.  You may notice the fetus "dropping," or moving lower in your abdomen.  You may have a bloody mucus discharge. This usually occurs a few days to a week before labor begins.  Your cervix becomes thin and soft (effaced) near your due date. WHAT TO EXPECT AT YOUR PRENATAL EXAMS  You will have prenatal exams every 2 weeks until week 36. Then, you will have weekly prenatal exams. During a routine prenatal visit:  You will be weighed to make sure you and the fetus are growing normally.  Your blood pressure is taken.  Your abdomen will  be measured to track your baby's growth.  The fetal heartbeat will be listened to.  Any test results from the previous visit will be discussed.  You may have a cervical check near your due date to see if you have effaced. At around 36 weeks, your caregiver will check your cervix. At the same time, your caregiver will also perform a test on the secretions of the vaginal tissue. This test is to determine if a type of bacteria, Group B streptococcus, is present. Your caregiver will explain this further. Your caregiver may ask you:  What your birth plan is.  How you are feeling.  If you are feeling the baby move.  If you have had any abnormal symptoms, such as leaking fluid, bleeding, severe headaches, or abdominal cramping.  If you have any questions. Other tests or screenings that may be performed during your third trimester include:  Blood tests that check for low iron levels (anemia).  Fetal testing to check the health, activity level, and growth of the fetus. Testing is done if you have certain medical conditions or if there are problems during the pregnancy. FALSE LABOR You may feel small, irregular contractions that eventually go away. These are called Braxton Hicks contractions, or false labor. Contractions may last for hours, days, or even weeks before true labor sets in. If contractions come at regular intervals, intensify, or become painful, it is best to be seen by your caregiver.    SIGNS OF LABOR   Menstrual-like cramps.  Contractions that are 5 minutes apart or less.  Contractions that start on the top of the uterus and spread down to the lower abdomen and back.  A sense of increased pelvic pressure or back pain.  A watery or bloody mucus discharge that comes from the vagina. If you have any of these signs before the 37th week of pregnancy, call your caregiver right away. You need to go to the hospital to get checked immediately. HOME CARE INSTRUCTIONS   Avoid all  smoking, herbs, alcohol, and unprescribed drugs. These chemicals affect the formation and growth of the baby.  Follow your caregiver's instructions regarding medicine use. There are medicines that are either safe or unsafe to take during pregnancy.  Exercise only as directed by your caregiver. Experiencing uterine cramps is a good sign to stop exercising.  Continue to eat regular, healthy meals.  Wear a good support bra for breast tenderness.  Do not use hot tubs, steam rooms, or saunas.  Wear your seat belt at all times when driving.  Avoid raw meat, uncooked cheese, cat litter boxes, and soil used by cats. These carry germs that can cause birth defects in the baby.  Take your prenatal vitamins.  Try taking a stool softener (if your caregiver approves) if you develop constipation. Eat more high-fiber foods, such as fresh vegetables or fruit and whole grains. Drink plenty of fluids to keep your urine clear or pale yellow.  Take warm sitz baths to soothe any pain or discomfort caused by hemorrhoids. Use hemorrhoid cream if your caregiver approves.  If you develop varicose veins, wear support hose. Elevate your feet for 15 minutes, 3 4 times a day. Limit salt in your diet.  Avoid heavy lifting, wear low heal shoes, and practice good posture.  Rest a lot with your legs elevated if you have leg cramps or low back pain.  Visit your dentist if you have not gone during your pregnancy. Use a soft toothbrush to brush your teeth and be gentle when you floss.  A sexual relationship may be continued unless your caregiver directs you otherwise.  Do not travel far distances unless it is absolutely necessary and only with the approval of your caregiver.  Take prenatal classes to understand, practice, and ask questions about the labor and delivery.  Make a trial run to the hospital.  Pack your hospital bag.  Prepare the baby's nursery.  Continue to go to all your prenatal visits as directed  by your caregiver. SEEK MEDICAL CARE IF:  You are unsure if you are in labor or if your water has broken.  You have dizziness.  You have mild pelvic cramps, pelvic pressure, or nagging pain in your abdominal area.  You have persistent nausea, vomiting, or diarrhea.  You have a bad smelling vaginal discharge.  You have pain with urination. SEEK IMMEDIATE MEDICAL CARE IF:   You have a fever.  You are leaking fluid from your vagina.  You have spotting or bleeding from your vagina.  You have severe abdominal cramping or pain.  You have rapid weight loss or gain.  You have shortness of breath with chest pain.  You notice sudden or extreme swelling of your face, hands, ankles, feet, or legs.  You have not felt your baby move in over an hour.  You have severe headaches that do not go away with medicine.  You have vision changes. Document Released: 09/17/2001 Document Revised: 05/26/2013 Document Reviewed:   11/24/2012 [employer] Patient Information 2014 [employer].  Breastfeeding Deciding to breastfeed is one of the best choices you can make for you and your baby. A change in hormones during pregnancy causes your breast tissue to grow and increases the number and size of your milk ducts. These hormones also allow proteins, sugars, and fats from your blood supply to make breast milk in your milk-producing glands. Hormones prevent breast milk from being released before your baby is born as well as prompt milk flow after birth. Once breastfeeding has begun, thoughts of your baby, as well as his or her sucking or crying, can stimulate the release of milk from your milk-producing glands.  BENEFITS OF BREASTFEEDING For Your Baby  Your first milk (colostrum) helps your baby's digestive system function better.   There are antibodies in your milk that help your baby fight off infections.   Your baby has a lower incidence of asthma, allergies, and sudden infant death syndrome.    The nutrients in breast milk are better for your baby than infant formulas and are designed uniquely for your baby's needs.   Breast milk improves your baby's brain development.   Your baby is less likely to develop other conditions, such as childhood obesity, asthma, or type 2 diabetes mellitus.  For You   Breastfeeding helps to create a very special bond between you and your baby.   Breastfeeding is convenient. Breast milk is always available at the correct temperature and costs nothing.   Breastfeeding helps to burn calories and helps you lose the weight gained during pregnancy.   Breastfeeding makes your uterus contract to its prepregnancy size faster and slows bleeding (lochia) after you give birth.   Breastfeeding helps to lower your risk of developing type 2 diabetes mellitus, osteoporosis, and breast or ovarian cancer later in life. SIGNS THAT YOUR BABY IS HUNGRY Early Signs of Hunger  Increased alertness or activity.  Stretching.  Movement of the head from side to side.  Movement of the head and opening of the mouth when the corner of the mouth or cheek is stroked (rooting).  Increased sucking sounds, smacking lips, cooing, sighing, or squeaking.  Hand-to-mouth movements.  Increased sucking of fingers or hands. Late Signs of Hunger  Fussing.  Intermittent crying. Extreme Signs of Hunger Signs of extreme hunger will require calming and consoling before your baby will be able to breastfeed successfully. Do not wait for the following signs of extreme hunger to occur before you initiate breastfeeding:   Restlessness.  A loud, strong cry.   Screaming. BREASTFEEDING BASICS Breastfeeding Initiation  Find a comfortable place to sit or lie down, with your neck and back well supported.  Place a pillow or rolled up blanket under your baby to bring him or her to the level of your breast (if you are seated). Nursing pillows are specially designed to help  support your arms and your baby while you breastfeed.  Make sure that your baby's abdomen is facing your abdomen.   Gently massage your breast. With your fingertips, massage from your chest wall toward your nipple in a circular motion. This encourages milk flow. You may need to continue this action during the feeding if your milk flows slowly.  Support your breast with 4 fingers underneath and your thumb above your nipple. Make sure your fingers are well away from your nipple and your baby's mouth.   Stroke your baby's lips gently with your finger or nipple.   When your baby's mouth is   open wide enough, quickly bring your baby to your breast, placing your entire nipple and as much of the colored area around your nipple (areola) as possible into your baby's mouth.   More areola should be visible above your baby's upper lip than below the lower lip.   Your baby's tongue should be between his or her lower gum and your breast.   Ensure that your baby's mouth is correctly positioned around your nipple (latched). Your baby's lips should create a seal on your breast and be turned out (everted).  It is common for your baby to suck about 2 3 minutes in order to start the flow of breast milk. Latching Teaching your baby how to latch on to your breast properly is very important. An improper latch can cause nipple pain and decreased milk supply for you and poor weight gain in your baby. Also, if your baby is not latched onto your nipple properly, he or she may swallow some air during feeding. This can make your baby fussy. Burping your baby when you switch breasts during the feeding can help to get rid of the air. However, teaching your baby to latch on properly is still the best way to prevent fussiness from swallowing air while breastfeeding. Signs that your baby has successfully latched on to your nipple:    Silent tugging or silent sucking, without causing you pain.   Swallowing heard  between every 3 4 sucks.    Muscle movement above and in front of his or her ears while sucking.  Signs that your baby has not successfully latched on to nipple:   Sucking sounds or smacking sounds from your baby while breastfeeding.  Nipple pain. If you think your baby has not latched on correctly, slip your finger into the corner of your baby's mouth to break the suction and place it between your baby's gums. Attempt breastfeeding initiation again. Signs of Successful Breastfeeding Signs from your baby:   A gradual decrease in the number of sucks or complete cessation of sucking.   Falling asleep.   Relaxation of his or her body.   Retention of a small amount of milk in his or her mouth.   Letting go of your breast by himself or herself. Signs from you:  Breasts that have increased in firmness, weight, and size 1 3 hours after feeding.   Breasts that are softer immediately after breastfeeding.  Increased milk volume, as well as a change in milk consistency and color by the 5th day of breastfeeding.   Nipples that are not sore, cracked, or bleeding. Signs That Your Baby is Getting Enough Milk  Wetting at least 3 diapers in a 24-hour period. The urine should be clear and pale yellow by age 5 days.  At least 3 stools in a 24-hour period by age 5 days. The stool should be soft and yellow.  At least 3 stools in a 24-hour period by age 7 days. The stool should be seedy and yellow.  No loss of weight greater than 10% of birth weight during the first 3 days of age.  Average weight gain of 4 7 ounces (120 210 mL) per week after age 4 days.  Consistent daily weight gain by age 5 days, without weight loss after the age of 2 weeks. After a feeding, your baby may spit up a small amount. This is common. BREASTFEEDING FREQUENCY AND DURATION Frequent feeding will help you make more milk and can prevent sore nipples and breast engorgement.   Breastfeed when you feel the need to  reduce the fullness of your breasts or when your baby shows signs of hunger. This is called "breastfeeding on demand." Avoid introducing a pacifier to your baby while you are working to establish breastfeeding (the first 4 6 weeks after your baby is born). After this time you may choose to use a pacifier. Research has shown that pacifier use during the first year of a baby's life decreases the risk of sudden infant death syndrome (SIDS). Allow your baby to feed on each breast as long as he or she wants. Breastfeed until your baby is finished feeding. When your baby unlatches or falls asleep while feeding from the first breast, offer the second breast. Because newborns are often sleepy in the first few weeks of life, you may need to awaken your baby to get him or her to feed. Breastfeeding times will vary from baby to baby. However, the following rules can serve as a guide to help you ensure that your baby is properly fed:  Newborns (babies 4 weeks of age or younger) may breastfeed every 1 3 hours.  Newborns should not go longer than 3 hours during the day or 5 hours during the night without breastfeeding.  You should breastfeed your baby a minimum of 8 times in a 24-hour period until you begin to introduce solid foods to your baby at around 6 months of age. BREAST MILK PUMPING Pumping and storing breast milk allows you to ensure that your baby is exclusively fed your breast milk, even at times when you are unable to breastfeed. This is especially important if you are going back to work while you are still breastfeeding or when you are not able to be present during feedings. Your lactation consultant can give you guidelines on how long it is safe to store breast milk.  A breast pump is a machine that allows you to pump milk from your breast into a sterile bottle. The pumped breast milk can then be stored in a refrigerator or freezer. Some breast pumps are operated by hand, while others use electricity. Ask  your lactation consultant which type will work best for you. Breast pumps can be purchased, but some hospitals and breastfeeding support groups lease breast pumps on a monthly basis. A lactation consultant can teach you how to hand express breast milk, if you prefer not to use a pump.  CARING FOR YOUR BREASTS WHILE YOU BREASTFEED Nipples can become dry, cracked, and sore while breastfeeding. The following recommendations can help keep your breasts moisturized and healthy:  Avoid using soap on your nipples.   Wear a supportive bra. Although not required, special nursing bras and tank tops are designed to allow access to your breasts for breastfeeding without taking off your entire bra or top. Avoid wearing underwire style bras or extremely tight bras.  Air dry your nipples for 3 4minutes after each feeding.   Use only cotton bra pads to absorb leaked breast milk. Leaking of breast milk between feedings is normal.   Use lanolin on your nipples after breastfeeding. Lanolin helps to maintain your skin's normal moisture barrier. If you use pure lanolin you do not need to wash it off before feeding your baby again. Pure lanolin is not toxic to your baby. You may also hand express a few drops of breast milk and gently massage that milk into your nipples and allow the milk to air dry. In the first few weeks after giving birth, some women   experience extremely full breasts (engorgement). Engorgement can make your breasts feel heavy, warm, and tender to the touch. Engorgement peaks within 3 5 days after you give birth. The following recommendations can help ease engorgement:  Completely empty your breasts while breastfeeding or pumping. You may want to start by applying warm, moist heat (in the shower or with warm water-soaked hand towels) just before feeding or pumping. This increases circulation and helps the milk flow. If your baby does not completely empty your breasts while breastfeeding, pump any extra  milk after he or she is finished.  Wear a snug bra (nursing or regular) or tank top for 1 2 days to signal your body to slightly decrease milk production.  Apply ice packs to your breasts, unless this is too uncomfortable for you.  Make sure that your baby is latched on and positioned properly while breastfeeding. If engorgement persists after 48 hours of following these recommendations, contact your health care provider or a lactation consultant. OVERALL HEALTH CARE RECOMMENDATIONS WHILE BREASTFEEDING  Eat healthy foods. Alternate between meals and snacks, eating 3 of each per day. Because what you eat affects your breast milk, some of the foods may make your baby more irritable than usual. Avoid eating these foods if you are sure that they are negatively affecting your baby.  Drink milk, fruit juice, and water to satisfy your thirst (about 10 glasses a day).   Rest often, relax, and continue to take your prenatal vitamins to prevent fatigue, stress, and anemia.  Continue breast self-awareness checks.  Avoid chewing and smoking tobacco.  Avoid alcohol and drug use. Some medicines that may be harmful to your baby can pass through breast milk. It is important to ask your health care provider before taking any medicine, including all over-the-counter and prescription medicine as well as vitamin and herbal supplements. It is possible to become pregnant while breastfeeding. If birth control is desired, ask your health care provider about options that will be safe for your baby. SEEK MEDICAL CARE IF:   You feel like you want to stop breastfeeding or have become frustrated with breastfeeding.  You have painful breasts or nipples.  Your nipples are cracked or bleeding.  Your breasts are red, tender, or warm.  You have a swollen area on either breast.  You have a fever or chills.  You have nausea or vomiting.  You have drainage other than breast milk from your nipples.  Your breasts  do not become full before feedings by the 5th day after you give birth.  You feel sad and depressed.  Your baby is too sleepy to eat well.  Your baby is having trouble sleeping.   Your baby is wetting less than 3 diapers in a 24-hour period.  Your baby has less than 3 stools in a 24-hour period.  Your baby's skin or the white part of his or her eyes becomes yellow.   Your baby is not gaining weight by 5 days of age. SEEK IMMEDIATE MEDICAL CARE IF:   Your baby is overly tired (lethargic) and does not want to wake up and feed.  Your baby develops an unexplained fever. Document Released: 09/23/2005 Document Revised: 05/26/2013 Document Reviewed: 03/17/2013 [employer] Patient Information 2014 [employer].  

## 2013-10-05 NOTE — Progress Notes (Signed)
Doing well--S<D but consistent.

## 2013-10-05 NOTE — Progress Notes (Signed)
P = 87 

## 2013-10-05 NOTE — Assessment & Plan Note (Signed)
Continue routine prenatal care.  

## 2013-10-07 NOTE — L&D Delivery Note (Signed)
Delivery Note At 12:47 AM a viable and healthy female was delivered via Vaginal, Spontaneous Delivery (Presentation: Left Occiput Anterior).  APGAR: 9, 9; weight .   Placenta status: Intact, Spontaneous.  Cord: 3 vessels with the following complications: None.  Cord pH: na  Ms. Roger ShelterGordon, 28 y.o. G2P1001, progressed rapidly from 7cm to complete and felt the need to push.  I arrived to room and patient pushed through 1-2 contractions with good maternal effort.  Delivered female infant over intact perineum.  Infant placed immediately on maternal abdomen.  Spontaneous cry, pink, good tone.  Delayed cord clamping at 2 min. Third stage of delivery was less than 10 minutes Placenta delivered intact.  3v cord.  All Counts correct.   Mom and baby are doing well.   Anesthesia: None  Episiotomy: None Lacerations: None Suture Repair: none Est. Blood Loss (mL): 300  Mom to postpartum.  Baby to Couplet care / Skin to Skin.  Quincy SimmondsFeeney, Patricia L 11/15/2013, 1:16 AM  I was present for the entire delivery and agree with documentation above.  Tawana ScaleMichael Ryan Calie Buttrey, MD OB Fellow

## 2013-10-19 ENCOUNTER — Encounter: Payer: Self-pay | Admitting: Obstetrics & Gynecology

## 2013-10-19 ENCOUNTER — Ambulatory Visit (INDEPENDENT_AMBULATORY_CARE_PROVIDER_SITE_OTHER): Payer: 59 | Admitting: Obstetrics & Gynecology

## 2013-10-19 VITALS — BP 112/64 | Wt 146.0 lb

## 2013-10-19 DIAGNOSIS — Z3480 Encounter for supervision of other normal pregnancy, unspecified trimester: Secondary | ICD-10-CM

## 2013-10-19 NOTE — Patient Instructions (Signed)
Return to clinic for any obstetric concerns or go to [facility] for evaluation  

## 2013-10-19 NOTE — Progress Notes (Signed)
P-78  For two days at work she has been getting some headache and a little dizzy.

## 2013-10-19 NOTE — Progress Notes (Signed)
Normal BP. Recommended Tylenol and adequate hydration.  No other complaints or concerns.  Fetal movement and labor precautions reviewed.  Pelvic cultures next visit.

## 2013-10-27 ENCOUNTER — Ambulatory Visit (INDEPENDENT_AMBULATORY_CARE_PROVIDER_SITE_OTHER): Payer: 59 | Admitting: Family Medicine

## 2013-10-27 ENCOUNTER — Encounter: Payer: Self-pay | Admitting: Family Medicine

## 2013-10-27 ENCOUNTER — Other Ambulatory Visit: Payer: Self-pay | Admitting: Family Medicine

## 2013-10-27 VITALS — BP 123/86 | Wt 147.0 lb

## 2013-10-27 DIAGNOSIS — Z3480 Encounter for supervision of other normal pregnancy, unspecified trimester: Secondary | ICD-10-CM

## 2013-10-27 NOTE — Progress Notes (Signed)
Doing well Cultures today 

## 2013-10-27 NOTE — Patient Instructions (Signed)
Third Trimester of Pregnancy The third trimester is from week 29 through week 42, months 7 through 9. The third trimester is a time when the fetus is growing rapidly. At the end of the ninth month, the fetus is about 20 inches in length and weighs 6 10 pounds.  BODY CHANGES Your body goes through many changes during pregnancy. The changes vary from woman to woman.   Your weight will continue to increase. You can expect to gain 25 35 pounds (11 16 kg) by the end of the pregnancy.  You may begin to get stretch marks on your hips, abdomen, and breasts.  You may urinate more often because the fetus is moving lower into your pelvis and pressing on your bladder.  You may develop or continue to have heartburn as a result of your pregnancy.  You may develop constipation because certain hormones are causing the muscles that push waste through your intestines to slow down.  You may develop hemorrhoids or swollen, bulging veins (varicose veins).  You may have pelvic pain because of the weight gain and pregnancy hormones relaxing your joints between the bones in your pelvis. Back aches may result from over exertion of the muscles supporting your posture.  Your breasts will continue to grow and be tender. A yellow discharge may leak from your breasts called colostrum.  Your belly button may stick out.  You may feel short of breath because of your expanding uterus.  You may notice the fetus "dropping," or moving lower in your abdomen.  You may have a bloody mucus discharge. This usually occurs a few days to a week before labor begins.  Your cervix becomes thin and soft (effaced) near your due date. WHAT TO EXPECT AT YOUR PRENATAL EXAMS  You will have prenatal exams every 2 weeks until week 36. Then, you will have weekly prenatal exams. During a routine prenatal visit:  You will be weighed to make sure you and the fetus are growing normally.  Your blood pressure is taken.  Your abdomen will  be measured to track your baby's growth.  The fetal heartbeat will be listened to.  Any test results from the previous visit will be discussed.  You may have a cervical check near your due date to see if you have effaced. At around 36 weeks, your caregiver will check your cervix. At the same time, your caregiver will also perform a test on the secretions of the vaginal tissue. This test is to determine if a type of bacteria, Group B streptococcus, is present. Your caregiver will explain this further. Your caregiver may ask you:  What your birth plan is.  How you are feeling.  If you are feeling the baby move.  If you have had any abnormal symptoms, such as leaking fluid, bleeding, severe headaches, or abdominal cramping.  If you have any questions. Other tests or screenings that may be performed during your third trimester include:  Blood tests that check for low iron levels (anemia).  Fetal testing to check the health, activity level, and growth of the fetus. Testing is done if you have certain medical conditions or if there are problems during the pregnancy. FALSE LABOR You may feel small, irregular contractions that eventually go away. These are called Braxton Hicks contractions, or false labor. Contractions may last for hours, days, or even weeks before true labor sets in. If contractions come at regular intervals, intensify, or become painful, it is best to be seen by your caregiver.    SIGNS OF LABOR   Menstrual-like cramps.  Contractions that are 5 minutes apart or less.  Contractions that start on the top of the uterus and spread down to the lower abdomen and back.  A sense of increased pelvic pressure or back pain.  A watery or bloody mucus discharge that comes from the vagina. If you have any of these signs before the 37th week of pregnancy, call your caregiver right away. You need to go to the hospital to get checked immediately. HOME CARE INSTRUCTIONS   Avoid all  smoking, herbs, alcohol, and unprescribed drugs. These chemicals affect the formation and growth of the baby.  Follow your caregiver's instructions regarding medicine use. There are medicines that are either safe or unsafe to take during pregnancy.  Exercise only as directed by your caregiver. Experiencing uterine cramps is a good sign to stop exercising.  Continue to eat regular, healthy meals.  Wear a good support bra for breast tenderness.  Do not use hot tubs, steam rooms, or saunas.  Wear your seat belt at all times when driving.  Avoid raw meat, uncooked cheese, cat litter boxes, and soil used by cats. These carry germs that can cause birth defects in the baby.  Take your prenatal vitamins.  Try taking a stool softener (if your caregiver approves) if you develop constipation. Eat more high-fiber foods, such as fresh vegetables or fruit and whole grains. Drink plenty of fluids to keep your urine clear or pale yellow.  Take warm sitz baths to soothe any pain or discomfort caused by hemorrhoids. Use hemorrhoid cream if your caregiver approves.  If you develop varicose veins, wear support hose. Elevate your feet for 15 minutes, 3 4 times a day. Limit salt in your diet.  Avoid heavy lifting, wear low heal shoes, and practice good posture.  Rest a lot with your legs elevated if you have leg cramps or low back pain.  Visit your dentist if you have not gone during your pregnancy. Use a soft toothbrush to brush your teeth and be gentle when you floss.  A sexual relationship may be continued unless your caregiver directs you otherwise.  Do not travel far distances unless it is absolutely necessary and only with the approval of your caregiver.  Take prenatal classes to understand, practice, and ask questions about the labor and delivery.  Make a trial run to the hospital.  Pack your hospital bag.  Prepare the baby's nursery.  Continue to go to all your prenatal visits as directed  by your caregiver. SEEK MEDICAL CARE IF:  You are unsure if you are in labor or if your water has broken.  You have dizziness.  You have mild pelvic cramps, pelvic pressure, or nagging pain in your abdominal area.  You have persistent nausea, vomiting, or diarrhea.  You have a bad smelling vaginal discharge.  You have pain with urination. SEEK IMMEDIATE MEDICAL CARE IF:   You have a fever.  You are leaking fluid from your vagina.  You have spotting or bleeding from your vagina.  You have severe abdominal cramping or pain.  You have rapid weight loss or gain.  You have shortness of breath with chest pain.  You notice sudden or extreme swelling of your face, hands, ankles, feet, or legs.  You have not felt your baby move in over an hour.  You have severe headaches that do not go away with medicine.  You have vision changes. Document Released: 09/17/2001 Document Revised: 05/26/2013 Document Reviewed:   11/24/2012 [employer] Patient Information 2014 [employer].  Breastfeeding Deciding to breastfeed is one of the best choices you can make for you and your baby. A change in hormones during pregnancy causes your breast tissue to grow and increases the number and size of your milk ducts. These hormones also allow proteins, sugars, and fats from your blood supply to make breast milk in your milk-producing glands. Hormones prevent breast milk from being released before your baby is born as well as prompt milk flow after birth. Once breastfeeding has begun, thoughts of your baby, as well as his or her sucking or crying, can stimulate the release of milk from your milk-producing glands.  BENEFITS OF BREASTFEEDING For Your Baby  Your first milk (colostrum) helps your baby's digestive system function better.   There are antibodies in your milk that help your baby fight off infections.   Your baby has a lower incidence of asthma, allergies, and sudden infant death syndrome.    The nutrients in breast milk are better for your baby than infant formulas and are designed uniquely for your baby's needs.   Breast milk improves your baby's brain development.   Your baby is less likely to develop other conditions, such as childhood obesity, asthma, or type 2 diabetes mellitus.  For You   Breastfeeding helps to create a very special bond between you and your baby.   Breastfeeding is convenient. Breast milk is always available at the correct temperature and costs nothing.   Breastfeeding helps to burn calories and helps you lose the weight gained during pregnancy.   Breastfeeding makes your uterus contract to its prepregnancy size faster and slows bleeding (lochia) after you give birth.   Breastfeeding helps to lower your risk of developing type 2 diabetes mellitus, osteoporosis, and breast or ovarian cancer later in life. SIGNS THAT YOUR BABY IS HUNGRY Early Signs of Hunger  Increased alertness or activity.  Stretching.  Movement of the head from side to side.  Movement of the head and opening of the mouth when the corner of the mouth or cheek is stroked (rooting).  Increased sucking sounds, smacking lips, cooing, sighing, or squeaking.  Hand-to-mouth movements.  Increased sucking of fingers or hands. Late Signs of Hunger  Fussing.  Intermittent crying. Extreme Signs of Hunger Signs of extreme hunger will require calming and consoling before your baby will be able to breastfeed successfully. Do not wait for the following signs of extreme hunger to occur before you initiate breastfeeding:   Restlessness.  A loud, strong cry.   Screaming. BREASTFEEDING BASICS Breastfeeding Initiation  Find a comfortable place to sit or lie down, with your neck and back well supported.  Place a pillow or rolled up blanket under your baby to bring him or her to the level of your breast (if you are seated). Nursing pillows are specially designed to help  support your arms and your baby while you breastfeed.  Make sure that your baby's abdomen is facing your abdomen.   Gently massage your breast. With your fingertips, massage from your chest wall toward your nipple in a circular motion. This encourages milk flow. You may need to continue this action during the feeding if your milk flows slowly.  Support your breast with 4 fingers underneath and your thumb above your nipple. Make sure your fingers are well away from your nipple and your baby's mouth.   Stroke your baby's lips gently with your finger or nipple.   When your baby's mouth is   open wide enough, quickly bring your baby to your breast, placing your entire nipple and as much of the colored area around your nipple (areola) as possible into your baby's mouth.   More areola should be visible above your baby's upper lip than below the lower lip.   Your baby's tongue should be between his or her lower gum and your breast.   Ensure that your baby's mouth is correctly positioned around your nipple (latched). Your baby's lips should create a seal on your breast and be turned out (everted).  It is common for your baby to suck about 2 3 minutes in order to start the flow of breast milk. Latching Teaching your baby how to latch on to your breast properly is very important. An improper latch can cause nipple pain and decreased milk supply for you and poor weight gain in your baby. Also, if your baby is not latched onto your nipple properly, he or she may swallow some air during feeding. This can make your baby fussy. Burping your baby when you switch breasts during the feeding can help to get rid of the air. However, teaching your baby to latch on properly is still the best way to prevent fussiness from swallowing air while breastfeeding. Signs that your baby has successfully latched on to your nipple:    Silent tugging or silent sucking, without causing you pain.   Swallowing heard  between every 3 4 sucks.    Muscle movement above and in front of his or her ears while sucking.  Signs that your baby has not successfully latched on to nipple:   Sucking sounds or smacking sounds from your baby while breastfeeding.  Nipple pain. If you think your baby has not latched on correctly, slip your finger into the corner of your baby's mouth to break the suction and place it between your baby's gums. Attempt breastfeeding initiation again. Signs of Successful Breastfeeding Signs from your baby:   A gradual decrease in the number of sucks or complete cessation of sucking.   Falling asleep.   Relaxation of his or her body.   Retention of a small amount of milk in his or her mouth.   Letting go of your breast by himself or herself. Signs from you:  Breasts that have increased in firmness, weight, and size 1 3 hours after feeding.   Breasts that are softer immediately after breastfeeding.  Increased milk volume, as well as a change in milk consistency and color by the 5th day of breastfeeding.   Nipples that are not sore, cracked, or bleeding. Signs That Your Baby is Getting Enough Milk  Wetting at least 3 diapers in a 24-hour period. The urine should be clear and pale yellow by age 5 days.  At least 3 stools in a 24-hour period by age 5 days. The stool should be soft and yellow.  At least 3 stools in a 24-hour period by age 7 days. The stool should be seedy and yellow.  No loss of weight greater than 10% of birth weight during the first 3 days of age.  Average weight gain of 4 7 ounces (120 210 mL) per week after age 4 days.  Consistent daily weight gain by age 5 days, without weight loss after the age of 2 weeks. After a feeding, your baby may spit up a small amount. This is common. BREASTFEEDING FREQUENCY AND DURATION Frequent feeding will help you make more milk and can prevent sore nipples and breast engorgement.   Breastfeed when you feel the need to  reduce the fullness of your breasts or when your baby shows signs of hunger. This is called "breastfeeding on demand." Avoid introducing a pacifier to your baby while you are working to establish breastfeeding (the first 4 6 weeks after your baby is born). After this time you may choose to use a pacifier. Research has shown that pacifier use during the first year of a baby's life decreases the risk of sudden infant death syndrome (SIDS). Allow your baby to feed on each breast as long as he or she wants. Breastfeed until your baby is finished feeding. When your baby unlatches or falls asleep while feeding from the first breast, offer the second breast. Because newborns are often sleepy in the first few weeks of life, you may need to awaken your baby to get him or her to feed. Breastfeeding times will vary from baby to baby. However, the following rules can serve as a guide to help you ensure that your baby is properly fed:  Newborns (babies 4 weeks of age or younger) may breastfeed every 1 3 hours.  Newborns should not go longer than 3 hours during the day or 5 hours during the night without breastfeeding.  You should breastfeed your baby a minimum of 8 times in a 24-hour period until you begin to introduce solid foods to your baby at around 6 months of age. BREAST MILK PUMPING Pumping and storing breast milk allows you to ensure that your baby is exclusively fed your breast milk, even at times when you are unable to breastfeed. This is especially important if you are going back to work while you are still breastfeeding or when you are not able to be present during feedings. Your lactation consultant can give you guidelines on how long it is safe to store breast milk.  A breast pump is a machine that allows you to pump milk from your breast into a sterile bottle. The pumped breast milk can then be stored in a refrigerator or freezer. Some breast pumps are operated by hand, while others use electricity. Ask  your lactation consultant which type will work best for you. Breast pumps can be purchased, but some hospitals and breastfeeding support groups lease breast pumps on a monthly basis. A lactation consultant can teach you how to hand express breast milk, if you prefer not to use a pump.  CARING FOR YOUR BREASTS WHILE YOU BREASTFEED Nipples can become dry, cracked, and sore while breastfeeding. The following recommendations can help keep your breasts moisturized and healthy:  Avoid using soap on your nipples.   Wear a supportive bra. Although not required, special nursing bras and tank tops are designed to allow access to your breasts for breastfeeding without taking off your entire bra or top. Avoid wearing underwire style bras or extremely tight bras.  Air dry your nipples for 3 4minutes after each feeding.   Use only cotton bra pads to absorb leaked breast milk. Leaking of breast milk between feedings is normal.   Use lanolin on your nipples after breastfeeding. Lanolin helps to maintain your skin's normal moisture barrier. If you use pure lanolin you do not need to wash it off before feeding your baby again. Pure lanolin is not toxic to your baby. You may also hand express a few drops of breast milk and gently massage that milk into your nipples and allow the milk to air dry. In the first few weeks after giving birth, some women   experience extremely full breasts (engorgement). Engorgement can make your breasts feel heavy, warm, and tender to the touch. Engorgement peaks within 3 5 days after you give birth. The following recommendations can help ease engorgement:  Completely empty your breasts while breastfeeding or pumping. You may want to start by applying warm, moist heat (in the shower or with warm water-soaked hand towels) just before feeding or pumping. This increases circulation and helps the milk flow. If your baby does not completely empty your breasts while breastfeeding, pump any extra  milk after he or she is finished.  Wear a snug bra (nursing or regular) or tank top for 1 2 days to signal your body to slightly decrease milk production.  Apply ice packs to your breasts, unless this is too uncomfortable for you.  Make sure that your baby is latched on and positioned properly while breastfeeding. If engorgement persists after 48 hours of following these recommendations, contact your health care provider or a lactation consultant. OVERALL HEALTH CARE RECOMMENDATIONS WHILE BREASTFEEDING  Eat healthy foods. Alternate between meals and snacks, eating 3 of each per day. Because what you eat affects your breast milk, some of the foods may make your baby more irritable than usual. Avoid eating these foods if you are sure that they are negatively affecting your baby.  Drink milk, fruit juice, and water to satisfy your thirst (about 10 glasses a day).   Rest often, relax, and continue to take your prenatal vitamins to prevent fatigue, stress, and anemia.  Continue breast self-awareness checks.  Avoid chewing and smoking tobacco.  Avoid alcohol and drug use. Some medicines that may be harmful to your baby can pass through breast milk. It is important to ask your health care provider before taking any medicine, including all over-the-counter and prescription medicine as well as vitamin and herbal supplements. It is possible to become pregnant while breastfeeding. If birth control is desired, ask your health care provider about options that will be safe for your baby. SEEK MEDICAL CARE IF:   You feel like you want to stop breastfeeding or have become frustrated with breastfeeding.  You have painful breasts or nipples.  Your nipples are cracked or bleeding.  Your breasts are red, tender, or warm.  You have a swollen area on either breast.  You have a fever or chills.  You have nausea or vomiting.  You have drainage other than breast milk from your nipples.  Your breasts  do not become full before feedings by the 5th day after you give birth.  You feel sad and depressed.  Your baby is too sleepy to eat well.  Your baby is having trouble sleeping.   Your baby is wetting less than 3 diapers in a 24-hour period.  Your baby has less than 3 stools in a 24-hour period.  Your baby's skin or the white part of his or her eyes becomes yellow.   Your baby is not gaining weight by 5 days of age. SEEK IMMEDIATE MEDICAL CARE IF:   Your baby is overly tired (lethargic) and does not want to wake up and feed.  Your baby develops an unexplained fever. Document Released: 09/23/2005 Document Revised: 05/26/2013 Document Reviewed: 03/17/2013 [employer] Patient Information 2014 [employer].  

## 2013-10-27 NOTE — Progress Notes (Signed)
P = 76 

## 2013-11-02 ENCOUNTER — Encounter: Payer: 59 | Admitting: Obstetrics and Gynecology

## 2013-11-04 ENCOUNTER — Ambulatory Visit (INDEPENDENT_AMBULATORY_CARE_PROVIDER_SITE_OTHER): Payer: 59 | Admitting: Obstetrics & Gynecology

## 2013-11-04 ENCOUNTER — Encounter: Payer: Self-pay | Admitting: Obstetrics & Gynecology

## 2013-11-04 VITALS — BP 126/79 | Wt 149.0 lb

## 2013-11-04 DIAGNOSIS — Z3480 Encounter for supervision of other normal pregnancy, unspecified trimester: Secondary | ICD-10-CM

## 2013-11-04 NOTE — Progress Notes (Signed)
Routine visit. Good FM. No problems. She has noted an increased wetness/discharge for the last 3 days. Speculum exam reveals only cervical mucous. Amnistat negative, no pool, Valsalva. Labor precautions reviewed!

## 2013-11-04 NOTE — Progress Notes (Signed)
P:82  Patient is having increased watery discharge and is concerned she might be starting to leak fluid.  She is getting some headaches if she is doing too much at work.  No contractions. Otherwise doing well.

## 2013-11-10 ENCOUNTER — Ambulatory Visit (INDEPENDENT_AMBULATORY_CARE_PROVIDER_SITE_OTHER): Payer: 59 | Admitting: Family Medicine

## 2013-11-10 ENCOUNTER — Encounter: Payer: Self-pay | Admitting: Family Medicine

## 2013-11-10 VITALS — BP 119/78 | Wt 151.0 lb

## 2013-11-10 DIAGNOSIS — O368190 Decreased fetal movements, unspecified trimester, not applicable or unspecified: Secondary | ICD-10-CM

## 2013-11-10 DIAGNOSIS — Z3480 Encounter for supervision of other normal pregnancy, unspecified trimester: Secondary | ICD-10-CM

## 2013-11-10 NOTE — Progress Notes (Signed)
P-76 Patient has noticed a decrease in fetal movement but she can lay down and get the baby to move.  She is still feeling movement just not as often as before.  She is still having the headaches and is having an increase in swelling as well.

## 2013-11-10 NOTE — Patient Instructions (Signed)
Breastfeeding Deciding to breastfeed is one of the best choices you can make for you and your baby. A change in hormones during pregnancy causes your breast tissue to grow and increases the number and size of your milk ducts. These hormones also allow proteins, sugars, and fats from your blood supply to make breast milk in your milk-producing glands. Hormones prevent breast milk from being released before your baby is born as well as prompt milk flow after birth. Once breastfeeding has begun, thoughts of your baby, as well as his or her sucking or crying, can stimulate the release of milk from your milk-producing glands.  BENEFITS OF BREASTFEEDING For Your Baby  Your first milk (colostrum) helps your baby's digestive system function better.   There are antibodies in your milk that help your baby fight off infections.   Your baby has a lower incidence of asthma, allergies, and sudden infant death syndrome.   The nutrients in breast milk are better for your baby than infant formulas and are designed uniquely for your baby's needs.   Breast milk improves your baby's brain development.   Your baby is less likely to develop other conditions, such as childhood obesity, asthma, or type 2 diabetes mellitus.  For You   Breastfeeding helps to create a very special bond between you and your baby.   Breastfeeding is convenient. Breast milk is always available at the correct temperature and costs nothing.   Breastfeeding helps to burn calories and helps you lose the weight gained during pregnancy.   Breastfeeding makes your uterus contract to its prepregnancy size faster and slows bleeding (lochia) after you give birth.   Breastfeeding helps to lower your risk of developing type 2 diabetes mellitus, osteoporosis, and breast or ovarian cancer later in life. SIGNS THAT YOUR BABY IS HUNGRY Early Signs of Hunger  Increased alertness or activity.  Stretching.  Movement of the head from  side to side.  Movement of the head and opening of the mouth when the corner of the mouth or cheek is stroked (rooting).  Increased sucking sounds, smacking lips, cooing, sighing, or squeaking.  Hand-to-mouth movements.  Increased sucking of fingers or hands. Late Signs of Hunger  Fussing.  Intermittent crying. Extreme Signs of Hunger Signs of extreme hunger will require calming and consoling before your baby will be able to breastfeed successfully. Do not wait for the following signs of extreme hunger to occur before you initiate breastfeeding:   Restlessness.  A loud, strong cry.   Screaming. BREASTFEEDING BASICS Breastfeeding Initiation  Find a comfortable place to sit or lie down, with your neck and back well supported.  Place a pillow or rolled up blanket under your baby to bring him or her to the level of your breast (if you are seated). Nursing pillows are specially designed to help support your arms and your baby while you breastfeed.  Make sure that your baby's abdomen is facing your abdomen.   Gently massage your breast. With your fingertips, massage from your chest wall toward your nipple in a circular motion. This encourages milk flow. You may need to continue this action during the feeding if your milk flows slowly.  Support your breast with 4 fingers underneath and your thumb above your nipple. Make sure your fingers are well away from your nipple and your baby's mouth.   Stroke your baby's lips gently with your finger or nipple.   When your baby's mouth is open wide enough, quickly bring your baby to your   breast, placing your entire nipple and as much of the colored area around your nipple (areola) as possible into your baby's mouth.   More areola should be visible above your baby's upper lip than below the lower lip.   Your baby's tongue should be between his or her lower gum and your breast.   Ensure that your baby's mouth is correctly positioned  around your nipple (latched). Your baby's lips should create a seal on your breast and be turned out (everted).  It is common for your baby to suck about 2 3 minutes in order to start the flow of breast milk. Latching Teaching your baby how to latch on to your breast properly is very important. An improper latch can cause nipple pain and decreased milk supply for you and poor weight gain in your baby. Also, if your baby is not latched onto your nipple properly, he or she may swallow some air during feeding. This can make your baby fussy. Burping your baby when you switch breasts during the feeding can help to get rid of the air. However, teaching your baby to latch on properly is still the best way to prevent fussiness from swallowing air while breastfeeding. Signs that your baby has successfully latched on to your nipple:    Silent tugging or silent sucking, without causing you pain.   Swallowing heard between every 3 4 sucks.    Muscle movement above and in front of his or her ears while sucking.  Signs that your baby has not successfully latched on to nipple:   Sucking sounds or smacking sounds from your baby while breastfeeding.  Nipple pain. If you think your baby has not latched on correctly, slip your finger into the corner of your baby's mouth to break the suction and place it between your baby's gums. Attempt breastfeeding initiation again. Signs of Successful Breastfeeding Signs from your baby:   A gradual decrease in the number of sucks or complete cessation of sucking.   Falling asleep.   Relaxation of his or her body.   Retention of a small amount of milk in his or her mouth.   Letting go of your breast by himself or herself. Signs from you:  Breasts that have increased in firmness, weight, and size 1 3 hours after feeding.   Breasts that are softer immediately after breastfeeding.  Increased milk volume, as well as a change in milk consistency and color by  the 5th day of breastfeeding.   Nipples that are not sore, cracked, or bleeding. Signs That Your Baby is Getting Enough Milk  Wetting at least 3 diapers in a 24-hour period. The urine should be clear and pale yellow by age 5 days.  At least 3 stools in a 24-hour period by age 5 days. The stool should be soft and yellow.  At least 3 stools in a 24-hour period by age 7 days. The stool should be seedy and yellow.  No loss of weight greater than 10% of birth weight during the first 3 days of age.  Average weight gain of 4 7 ounces (120 210 mL) per week after age 4 days.  Consistent daily weight gain by age 5 days, without weight loss after the age of 2 weeks. After a feeding, your baby may spit up a small amount. This is common. BREASTFEEDING FREQUENCY AND DURATION Frequent feeding will help you make more milk and can prevent sore nipples and breast engorgement. Breastfeed when you feel the need to reduce   the fullness of your breasts or when your baby shows signs of hunger. This is called "breastfeeding on demand." Avoid introducing a pacifier to your baby while you are working to establish breastfeeding (the first 4 6 weeks after your baby is born). After this time you may choose to use a pacifier. Research has shown that pacifier use during the first year of a baby's life decreases the risk of sudden infant death syndrome (SIDS). Allow your baby to feed on each breast as long as he or she wants. Breastfeed until your baby is finished feeding. When your baby unlatches or falls asleep while feeding from the first breast, offer the second breast. Because newborns are often sleepy in the first few weeks of life, you may need to awaken your baby to get him or her to feed. Breastfeeding times will vary from baby to baby. However, the following rules can serve as a guide to help you ensure that your baby is properly fed:  Newborns (babies 4 weeks of age or younger) may breastfeed every 1 3  hours.  Newborns should not go longer than 3 hours during the day or 5 hours during the night without breastfeeding.  You should breastfeed your baby a minimum of 8 times in a 24-hour period until you begin to introduce solid foods to your baby at around 6 months of age. BREAST MILK PUMPING Pumping and storing breast milk allows you to ensure that your baby is exclusively fed your breast milk, even at times when you are unable to breastfeed. This is especially important if you are going back to work while you are still breastfeeding or when you are not able to be present during feedings. Your lactation consultant can give you guidelines on how long it is safe to store breast milk.  A breast pump is a machine that allows you to pump milk from your breast into a sterile bottle. The pumped breast milk can then be stored in a refrigerator or freezer. Some breast pumps are operated by hand, while others use electricity. Ask your lactation consultant which type will work best for you. Breast pumps can be purchased, but some hospitals and breastfeeding support groups lease breast pumps on a monthly basis. A lactation consultant can teach you how to hand express breast milk, if you prefer not to use a pump.  CARING FOR YOUR BREASTS WHILE YOU BREASTFEED Nipples can become dry, cracked, and sore while breastfeeding. The following recommendations can help keep your breasts moisturized and healthy:  Avoid using soap on your nipples.   Wear a supportive bra. Although not required, special nursing bras and tank tops are designed to allow access to your breasts for breastfeeding without taking off your entire bra or top. Avoid wearing underwire style bras or extremely tight bras.  Air dry your nipples for 3 4minutes after each feeding.   Use only cotton bra pads to absorb leaked breast milk. Leaking of breast milk between feedings is normal.   Use lanolin on your nipples after breastfeeding. Lanolin helps to  maintain your skin's normal moisture barrier. If you use pure lanolin you do not need to wash it off before feeding your baby again. Pure lanolin is not toxic to your baby. You may also hand express a few drops of breast milk and gently massage that milk into your nipples and allow the milk to air dry. In the first few weeks after giving birth, some women experience extremely full breasts (engorgement). Engorgement can make   your breasts feel heavy, warm, and tender to the touch. Engorgement peaks within 3 5 days after you give birth. The following recommendations can help ease engorgement:  Completely empty your breasts while breastfeeding or pumping. You may want to start by applying warm, moist heat (in the shower or with warm water-soaked hand towels) just before feeding or pumping. This increases circulation and helps the milk flow. If your baby does not completely empty your breasts while breastfeeding, pump any extra milk after he or she is finished.  Wear a snug bra (nursing or regular) or tank top for 1 2 days to signal your body to slightly decrease milk production.  Apply ice packs to your breasts, unless this is too uncomfortable for you.  Make sure that your baby is latched on and positioned properly while breastfeeding. If engorgement persists after 48 hours of following these recommendations, contact your health care provider or a lactation consultant. OVERALL HEALTH CARE RECOMMENDATIONS WHILE BREASTFEEDING  Eat healthy foods. Alternate between meals and snacks, eating 3 of each per day. Because what you eat affects your breast milk, some of the foods may make your baby more irritable than usual. Avoid eating these foods if you are sure that they are negatively affecting your baby.  Drink milk, fruit juice, and water to satisfy your thirst (about 10 glasses a day).   Rest often, relax, and continue to take your prenatal vitamins to prevent fatigue, stress, and anemia.  Continue  breast self-awareness checks.  Avoid chewing and smoking tobacco.  Avoid alcohol and drug use. Some medicines that may be harmful to your baby can pass through breast milk. It is important to ask your health care provider before taking any medicine, including all over-the-counter and prescription medicine as well as vitamin and herbal supplements. It is possible to become pregnant while breastfeeding. If birth control is desired, ask your health care provider about options that will be safe for your baby. SEEK MEDICAL CARE IF:   You feel like you want to stop breastfeeding or have become frustrated with breastfeeding.  You have painful breasts or nipples.  Your nipples are cracked or bleeding.  Your breasts are red, tender, or warm.  You have a swollen area on either breast.  You have a fever or chills.  You have nausea or vomiting.  You have drainage other than breast milk from your nipples.  Your breasts do not become full before feedings by the 5th day after you give birth.  You feel sad and depressed.  Your baby is too sleepy to eat well.  Your baby is having trouble sleeping.   Your baby is wetting less than 3 diapers in a 24-hour period.  Your baby has less than 3 stools in a 24-hour period.  Your baby's skin or the white part of his or her eyes becomes yellow.   Your baby is not gaining weight by 5 days of age. SEEK IMMEDIATE MEDICAL CARE IF:   Your baby is overly tired (lethargic) and does not want to wake up and feed.  Your baby develops an unexplained fever. Document Released: 09/23/2005 Document Revised: 05/26/2013 Document Reviewed: 03/17/2013 [employer] Patient Information 2014 [employer].  

## 2013-11-10 NOTE — Progress Notes (Signed)
NST for Decreased fm Membranes stripped. NST reviewed and reactive.

## 2013-11-14 ENCOUNTER — Inpatient Hospital Stay (HOSPITAL_COMMUNITY)
Admission: AD | Admit: 2013-11-14 | Discharge: 2013-11-16 | DRG: 775 | Disposition: A | Discharge status: Home / Self Care | Payer: 59 | Source: Ambulatory Visit | Attending: Obstetrics & Gynecology | Admitting: Obstetrics & Gynecology

## 2013-11-14 ENCOUNTER — Encounter (HOSPITAL_COMMUNITY): Payer: Self-pay | Admitting: *Deleted

## 2013-11-14 DIAGNOSIS — O360990 Maternal care for other rhesus isoimmunization, unspecified trimester, not applicable or unspecified: Principal | ICD-10-CM | POA: Diagnosis present

## 2013-11-14 DIAGNOSIS — Z87891 Personal history of nicotine dependence: Secondary | ICD-10-CM

## 2013-11-14 DIAGNOSIS — Z3490 Encounter for supervision of normal pregnancy, unspecified, unspecified trimester: Secondary | ICD-10-CM

## 2013-11-14 LAB — CBC
HCT: 35.6 % — ABNORMAL LOW (ref 36.0–46.0)
Hemoglobin: 12.7 g/dL (ref 12.0–15.0)
MCH: 30.8 pg (ref 26.0–34.0)
MCHC: 35.7 g/dL (ref 30.0–36.0)
MCV: 86.2 fL (ref 78.0–100.0)
Platelets: 196 10*3/uL (ref 150–400)
RBC: 4.13 MIL/uL (ref 3.87–5.11)
RDW: 13.2 % (ref 11.5–15.5)
WBC: 15.5 10*3/uL — ABNORMAL HIGH (ref 4.0–10.5)

## 2013-11-14 LAB — GROUP B STREP BY PCR: Group B strep by PCR: NEGATIVE

## 2013-11-14 LAB — RPR: RPR Ser Ql: NONREACTIVE

## 2013-11-14 LAB — OB RESULTS CONSOLE GBS: GBS: NEGATIVE

## 2013-11-14 LAB — POCT FERN TEST: POCT Fern Test: POSITIVE

## 2013-11-14 MED ORDER — ACETAMINOPHEN 325 MG PO TABS: 650.0000 mg | ORAL_TABLET | ORAL | Status: DC | PRN

## 2013-11-14 MED ORDER — LIDOCAINE HCL (PF) 1 % IJ SOLN
30.0000 mL | INTRAMUSCULAR | Status: DC | PRN
  Filled 2013-11-14: qty 30

## 2013-11-14 MED ORDER — FLEET ENEMA 7-19 GM/118ML RE ENEM: 1.0000 | ENEMA | RECTAL | Status: DC | PRN

## 2013-11-14 MED ORDER — OXYTOCIN BOLUS FROM INFUSION
500.0000 mL | INTRAVENOUS | Status: DC
  Administered 2013-11-15: 500 mL via INTRAVENOUS

## 2013-11-14 MED ORDER — ONDANSETRON HCL 4 MG/2ML IJ SOLN: 4.0000 mg | Freq: Four times a day (QID) | INTRAMUSCULAR | Status: DC | PRN

## 2013-11-14 MED ORDER — IBUPROFEN 600 MG PO TABS
600.0000 mg | ORAL_TABLET | Freq: Four times a day (QID) | ORAL | Status: DC | PRN
  Administered 2013-11-15: 600 mg via ORAL
  Filled 2013-11-14: qty 1

## 2013-11-14 MED ORDER — OXYTOCIN 40 UNITS IN LACTATED RINGERS INFUSION - SIMPLE MED
1.0000 m[IU]/min | INTRAVENOUS | Status: DC
  Administered 2013-11-14: 2 m[IU]/min via INTRAVENOUS
  Filled 2013-11-14: qty 1000

## 2013-11-14 MED ORDER — LACTATED RINGERS IV SOLN
INTRAVENOUS | Status: DC
  Administered 2013-11-14: 23:00:00 via INTRAVENOUS

## 2013-11-14 MED ORDER — CITRIC ACID-SODIUM CITRATE 334-500 MG/5ML PO SOLN: 30.0000 mL | ORAL | Status: DC | PRN

## 2013-11-14 MED ORDER — TERBUTALINE SULFATE 1 MG/ML IJ SOLN: 0.2500 mg | Freq: Once | INTRAMUSCULAR | Status: AC | PRN

## 2013-11-14 MED ORDER — OXYTOCIN 40 UNITS IN LACTATED RINGERS INFUSION - SIMPLE MED
62.5000 mL/h | INTRAVENOUS | Status: DC
  Filled 2013-11-14: qty 1000

## 2013-11-14 MED ORDER — LACTATED RINGERS IV SOLN: 500.0000 mL | INTRAVENOUS | Status: DC | PRN

## 2013-11-14 MED ORDER — OXYCODONE-ACETAMINOPHEN 5-325 MG PO TABS: 1.0000 | ORAL_TABLET | ORAL | Status: DC | PRN

## 2013-11-14 MED ORDER — FENTANYL CITRATE 0.05 MG/ML IJ SOLN: 100.0000 ug | INTRAMUSCULAR | Status: DC | PRN

## 2013-11-14 NOTE — Progress Notes (Signed)
Gloria Dean is a 28 y.o. G2P1001 at [id number]  admitted for rupture of membranes  Subjective: No painful contraction. No complaints  Objective: BP 116/63  Pulse 67  Temp(Src) 98.3 F (36.8 C) (Oral)  Resp 18  Ht 5\' 3"  (1.6 m)  Wt 69.4 kg (153 lb)  BMI 27.11 kg/m2  LMP 02/22/2013      FHT:  FHR: 130s bpm, variability: moderate,  accelerations:  Present,  decelerations:  Absent UC:   irregular, every 4-8 minutes SVE:   Dilation: 3 Effacement (%): 70 Station: -2 Exam by:: Gloria Dean cnm  Labs: Lab Results  Component Value Date   WBC 15.5* 11/14/2013   HGB 12.7 11/14/2013   HCT 35.6* 11/14/2013   MCV 86.2 11/14/2013   PLT 196 11/14/2013    Assessment / Plan: Augmentation of labor for ROM. Continuing to [employer]  Labor: latent labor, continue to titrate pitocin Preeclampsia:  no signs or symptoms of toxicity Fetal Wellbeing:  Category I Pain Control:  Labor support without medications may use epidural as needed. I/D:  n/a Anticipated MOD:  NSVD  Antonie Borjon RYAN 11/14/2013, 11:06 PM

## 2013-11-14 NOTE — H&P (Signed)
HPI: Gloria Dean is a 28 y.o. year old 322P1001 female at 2783w5d weeks gestation by 13 weeks US who presents to MAU reporting Spontaneous rupture of membranes at 0700. Was initially unsure if she was really leaking fluid until she had a large gush at 1330. Reports mild contractions.   Clinic  [facility]  Dating LMP/Ultrasound:  8 weeks        Ultrasound consistent with LMP: Yes w/i 2 days  Genetic Screen 1 Screen:   WNL              AFP:   Normal  Anatomic US  Normal  GTT Third trimester: 91  TDaP vaccine 09/07/13  Flu vaccine 06/18/13  GBS Neg  Baby Food Breast  Contraception Vasectomy  Pediatrician [facility]  Dr. Athena MasseBonney   Patient Active Problem List   Diagnosis Date Noted  . Pregnancy 11/14/2013  . Rh negative state in antepartum period 05/08/2013  . Supervision of other normal pregnancy 05/07/2013  . Genital warts 12/17/2011  . ASCUS +HRHPV in pregnancy 09/05/2011  . Asthma with allergic rhinitis 08/07/2011    History OB History   Grav Para Term Preterm Abortions TAB SAB Ect Mult Living   2 1 1  0 0 0 0 0 0 1     Past Medical History  Diagnosis Date  . Abnormal Pap smear 2010  . Infection     chylamidia  . Asthma   . Preeclampsia 2113   Past Surgical History  Procedure Laterality Date  . Wisdom tooth extraction     Family History: family history includes Cancer in her maternal grandfather. Social History:  reports that she quit smoking about 2 years ago. Her smoking use included Cigarettes. She smoked 0.00 packs per day. She has never used smokeless tobacco. She reports that she drinks alcohol. She reports that she does not use illicit drugs.   Prenatal Transfer Tool  Maternal Diabetes: No Genetic Screening: Normal Maternal Ultrasounds/Referrals: Normal Fetal Ultrasounds or other Referrals:  None Maternal Substance Abuse:  No Significant Maternal Medications:  None Significant Maternal Lab Results:  Lab values include: Group B Strep negative, Rh  negative Other Comments:  HbsAg pending  ROS  Dilation: 3 Effacement (%): 70 Station: -2 Exam by:: Vernia Teem cnm Blood pressure 121/54, pulse 86, temperature 98.2 F (36.8 C), temperature source Oral, resp. rate 18, height 5\' 3"  (1.6 m), weight 69.4 kg (153 lb), last menstrual period 02/22/2013. Exam Physical Exam  Prenatal labs: ABO, Rh: --/--/AB NEG (02/08 1605) Antibody: POS (02/08 1605) Rubella:  Immune RPR: Nonreactive (12/02 0000)  HBsAg:   Pending HIV: Non-reactive (12/02 0000)  GBS: Negative (02/08 0000)  1 hour GTT 91  Assessment: 1. Labor: Latent, ROM since 0700. 2. Fetal Wellbeing: Category I  3. Pain Control: none 4. GBS: Neg 5. 38.5 week IUP  Plan:  1. Admit to BS per consult with MD 2. Routine L&D orders 3. Analgesia/anesthesia PRN  4. Pitocin augmentation  Milik Gilreath 11/14/2013, 8:55 PM

## 2013-11-14 NOTE — MAU Note (Signed)
Pt presents with complaints of leaking fluid all day with a large gush of clear fluid  around 130 today. Denies any contractions or bleeding

## 2013-11-15 ENCOUNTER — Encounter (HOSPITAL_COMMUNITY): Payer: Self-pay | Admitting: *Deleted

## 2013-11-15 DIAGNOSIS — O360990 Maternal care for other rhesus isoimmunization, unspecified trimester, not applicable or unspecified: Secondary | ICD-10-CM

## 2013-11-15 DIAGNOSIS — Z87891 Personal history of nicotine dependence: Secondary | ICD-10-CM

## 2013-11-15 LAB — HEPATITIS B SURFACE ANTIGEN: Hepatitis B Surface Ag: NEGATIVE

## 2013-11-15 MED ORDER — PRENATAL MULTIVITAMIN CH
1.0000 | ORAL_TABLET | Freq: Every day | ORAL | Status: DC
  Administered 2013-11-15: 1 via ORAL
  Filled 2013-11-15: qty 1

## 2013-11-15 MED ORDER — LANOLIN HYDROUS EX OINT: TOPICAL_OINTMENT | CUTANEOUS | Status: DC | PRN

## 2013-11-15 MED ORDER — FENTANYL 2.5 MCG/ML BUPIVACAINE 1/10 % EPIDURAL INFUSION (WH - ANES): 14.0000 mL/h | INTRAMUSCULAR | Status: DC | PRN

## 2013-11-15 MED ORDER — OXYCODONE-ACETAMINOPHEN 5-325 MG PO TABS: 1.0000 | ORAL_TABLET | ORAL | Status: DC | PRN

## 2013-11-15 MED ORDER — SIMETHICONE 80 MG PO CHEW: 80.0000 mg | CHEWABLE_TABLET | ORAL | Status: DC | PRN

## 2013-11-15 MED ORDER — BENZOCAINE-MENTHOL 20-0.5 % EX AERO
1.0000 "application " | INHALATION_SPRAY | CUTANEOUS | Status: DC | PRN
  Filled 2013-11-15: qty 56

## 2013-11-15 MED ORDER — EPHEDRINE 5 MG/ML INJ: 10.0000 mg | INTRAVENOUS | Status: DC | PRN

## 2013-11-15 MED ORDER — ONDANSETRON HCL 4 MG PO TABS: 4.0000 mg | ORAL_TABLET | ORAL | Status: DC | PRN

## 2013-11-15 MED ORDER — ZOLPIDEM TARTRATE 5 MG PO TABS: 5.0000 mg | ORAL_TABLET | Freq: Every evening | ORAL | Status: DC | PRN

## 2013-11-15 MED ORDER — WITCH HAZEL-GLYCERIN EX PADS: 1.0000 "application " | MEDICATED_PAD | CUTANEOUS | Status: DC | PRN

## 2013-11-15 MED ORDER — DIPHENHYDRAMINE HCL 50 MG/ML IJ SOLN: 12.5000 mg | INTRAMUSCULAR | Status: DC | PRN

## 2013-11-15 MED ORDER — ONDANSETRON HCL 4 MG/2ML IJ SOLN: 4.0000 mg | INTRAMUSCULAR | Status: DC | PRN

## 2013-11-15 MED ORDER — IBUPROFEN 600 MG PO TABS
600.0000 mg | ORAL_TABLET | Freq: Four times a day (QID) | ORAL | Status: DC
  Administered 2013-11-15 – 2013-11-16 (×5): 600 mg via ORAL
  Filled 2013-11-15 (×5): qty 1

## 2013-11-15 MED ORDER — PNEUMOCOCCAL VAC POLYVALENT 25 MCG/0.5ML IJ INJ
0.5000 mL | INJECTION | Freq: Once | INTRAMUSCULAR | Status: AC
  Administered 2013-11-15: 0.5 mL via INTRAMUSCULAR
  Filled 2013-11-15: qty 0.5

## 2013-11-15 MED ORDER — PHENYLEPHRINE 40 MCG/ML (10ML) SYRINGE FOR IV PUSH (FOR BLOOD PRESSURE SUPPORT): 80.0000 ug | PREFILLED_SYRINGE | INTRAVENOUS | Status: DC | PRN

## 2013-11-15 MED ORDER — TETANUS-DIPHTH-ACELL PERTUSSIS 5-2.5-18.5 LF-MCG/0.5 IM SUSP: 0.5000 mL | Freq: Once | INTRAMUSCULAR | Status: DC

## 2013-11-15 MED ORDER — DIPHENHYDRAMINE HCL 25 MG PO CAPS: 25.0000 mg | ORAL_CAPSULE | Freq: Four times a day (QID) | ORAL | Status: DC | PRN

## 2013-11-15 MED ORDER — SENNOSIDES-DOCUSATE SODIUM 8.6-50 MG PO TABS
2.0000 | ORAL_TABLET | ORAL | Status: DC
  Administered 2013-11-16: 2 via ORAL
  Filled 2013-11-15: qty 2

## 2013-11-15 MED ORDER — DIBUCAINE 1 % RE OINT
1.0000 "application " | TOPICAL_OINTMENT | RECTAL | Status: DC | PRN
  Filled 2013-11-15: qty 28

## 2013-11-15 MED ORDER — LACTATED RINGERS IV SOLN: 500.0000 mL | Freq: Once | INTRAVENOUS | Status: DC

## 2013-11-15 NOTE — Progress Notes (Signed)
Pt asks to get up and sit on toilet.  Pt off monitors to go to br

## 2013-11-16 ENCOUNTER — Encounter: Payer: 59 | Admitting: Family Medicine

## 2013-11-16 MED ORDER — NORETHINDRONE 0.35 MG PO TABS: 1.0000 | ORAL_TABLET | Freq: Every day | ORAL | Status: DC

## 2013-11-16 MED ORDER — RHO D IMMUNE GLOBULIN 1500 UNIT/2ML IJ SOLN
300.0000 ug | Freq: Once | INTRAMUSCULAR | Status: AC
  Administered 2013-11-16: 300 ug via INTRAMUSCULAR
  Filled 2013-11-16: qty 2

## 2013-11-16 NOTE — Discharge Summary (Signed)
Attestation of Attending Supervision of Fellow: Evaluation and management procedures were performed by the Fellow under my supervision and collaboration.  I have reviewed the Fellow's note and chart, and I agree with the management and plan.    

## 2013-11-16 NOTE — Discharge Instructions (Signed)

## 2013-11-16 NOTE — Discharge Summary (Signed)
Obstetric Discharge Summary Reason for Admission: onset of labor Prenatal Procedures: none Intrapartum Procedures: spontaneous vaginal delivery Postpartum Procedures: none Complications-Operative and Postpartum: none Hemoglobin  Date Value Range Status  11/14/2013 12.7  12.0 - 15.0 g/dL Final     HCT  Date Value Range Status  11/14/2013 35.6* 36.0 - 46.0 % Final    Physical Exam:  General: alert, cooperative, appears stated age and no distress Uterine Fundus: soft DVT Evaluation: No evidence of DVT seen on physical exam. Negative Homan's sign. No cords or calf tenderness. No significant calf/ankle edema.  Discharge Diagnoses: Term Pregnancy-delivered  Discharge Information: Date: 11/16/2013 Activity: pelvic rest Diet: routine Medications: PNV Condition: stable Instructions: refer to practice specific booklet Discharge to: home  Delivery Note At 12:47 AM a viable and healthy female was delivered via Vaginal, Spontaneous Delivery (Presentation: Left Occiput Anterior).  APGAR: 9, 9; weight .   Placenta status: Intact, Spontaneous.  Cord: 3 vessels with the following complications: None.  Cord pH: na  Ms. Roger ShelterGordon, 28 y.o. G2P1001, progressed rapidly from 7cm to complete and felt the need to push.  I arrived to room and patient pushed through 1-2 contractions with good maternal effort.  Delivered female infant over intact perineum.  Infant placed immediately on maternal abdomen.  Spontaneous cry, pink, good tone.  Delayed cord clamping at 2 min. Third stage of delivery was less than 10 minutes Placenta delivered intact.  3v cord.  All Counts correct.   Mom and baby are doing well.   Anesthesia: None  Episiotomy: None Lacerations: None Suture Repair: none Est. Blood Loss (mL): 300  Mom to postpartum.  Baby to Couplet care / Skin to Skin.  Quincy SimmondsFeeney, Patricia L 11/15/2013, 1:16 AM  I was present for the entire delivery and agree with documentation above.  Tawana ScaleMichael Ryan Robertt Buda,  MD [facility]   Hospital Course Patient presented to MAU with SROM, went into active labor shortly thereafter and delivered later that evening. Breast feeding. Desires progesterone only pills until vasectomy.   Bleeding is decreasing per patient. Denies HA, dizziness, leg pain. Patient stable for discharge home.  Newborn Data: Live born female  Birth Weight: 6 lb 6 oz (2892 g) APGAR: 9, 9  Home with mother.  Quincy SimmondsFeeney, Patricia L 11/16/2013, 6:41 AM

## 2013-11-17 LAB — RH IG WORKUP (INCLUDES ABO/RH)
ABO/RH(D): AB NEG
Fetal Screen: NEGATIVE
Gestational Age(Wks): 38.6
Unit division: 0

## 2013-11-18 LAB — TYPE AND SCREEN
ABO/RH(D): AB NEG
Antibody Screen: POSITIVE
DAT, IgG: NEGATIVE
Unit division: 0
Unit division: 0

## 2013-12-10 LAB — PAP LB, RFX HPV ASCU: PAP Smear Comment: 0

## 2013-12-10 LAB — HPV DNA PROBE HIGH RISK, AMPLIFIED: HPV, high-risk: POSITIVE — AB

## 2013-12-21 LAB — CULTURE, BETA STREP (GROUP B ONLY)

## 2013-12-21 LAB — SPECIMEN STATUS REPORT

## 2013-12-23 ENCOUNTER — Encounter: Payer: Self-pay | Admitting: Obstetrics & Gynecology

## 2013-12-23 ENCOUNTER — Ambulatory Visit (INDEPENDENT_AMBULATORY_CARE_PROVIDER_SITE_OTHER): Payer: 59 | Admitting: Obstetrics & Gynecology

## 2013-12-23 NOTE — Progress Notes (Signed)
   Subjective:    Patient ID: Gloria Dean, female    DOB: October 31, 1985, 28 y.o.   MRN: [id number]  HPI  28 yo MW P2 here for her 6 week pp visit. She denies any problems, no sex yet. Husband is planning a vasectomy, she is on POPs. Baby is growing well with breastmilk. She denies depression and scored 0 on the test.  Review of Systems     Objective:   Physical Exam  Normal vulva and vagina and bimanual exam      Assessment & Plan:  PP- doing well RTC 1-2 months for annual/pap

## 2014-02-01 ENCOUNTER — Ambulatory Visit: Payer: 59 | Admitting: Obstetrics & Gynecology

## 2014-02-16 ENCOUNTER — Encounter: Payer: Self-pay | Admitting: Family Medicine

## 2014-02-16 ENCOUNTER — Ambulatory Visit (INDEPENDENT_AMBULATORY_CARE_PROVIDER_SITE_OTHER): Payer: 59 | Admitting: Family Medicine

## 2014-02-16 VITALS — BP 116/75 | HR 72 | Ht 63.0 in | Wt 129.0 lb

## 2014-02-16 DIAGNOSIS — Z01419 Encounter for gynecological examination (general) (routine) without abnormal findings: Secondary | ICD-10-CM

## 2014-02-16 DIAGNOSIS — IMO0002 Reserved for concepts with insufficient information to code with codable children: Secondary | ICD-10-CM

## 2014-02-16 DIAGNOSIS — R6889 Other general symptoms and signs: Secondary | ICD-10-CM

## 2014-02-16 DIAGNOSIS — Z124 Encounter for screening for malignant neoplasm of cervix: Secondary | ICD-10-CM

## 2014-02-16 LAB — CBC
HCT: 36.9 % (ref 36.0–46.0)
Hemoglobin: 12.8 g/dL (ref 12.0–15.0)
MCH: 29.8 pg (ref 26.0–34.0)
MCHC: 34.7 g/dL (ref 30.0–36.0)
MCV: 85.8 fL (ref 78.0–100.0)
Platelets: 250 10*3/uL (ref 150–400)
RBC: 4.3 MIL/uL (ref 3.87–5.11)
RDW: 13.3 % (ref 11.5–15.5)
WBC: 6.9 10*3/uL (ref 4.0–10.5)

## 2014-02-16 LAB — LIPID PANEL
Cholesterol: 163 mg/dL (ref 0–200)
HDL: 64 mg/dL (ref >39)
LDL Cholesterol: 90 mg/dL (ref 0–99)
Total CHOL/HDL Ratio: 2.5 Ratio
Triglycerides: 43 mg/dL (ref <150)
VLDL: 9 mg/dL (ref 0–40)

## 2014-02-16 LAB — COMPREHENSIVE METABOLIC PANEL
ALT: 17 U/L (ref 0–35)
AST: 23 U/L (ref 0–37)
Albumin: 4.7 g/dL (ref 3.5–5.2)
Alkaline Phosphatase: 53 U/L (ref 39–117)
BUN: 13 mg/dL (ref 6–23)
CO2: 25 mEq/L (ref 19–32)
Calcium: 8.8 mg/dL (ref 8.4–10.5)
Chloride: 104 mEq/L (ref 96–112)
Creat: 0.85 mg/dL (ref 0.50–1.10)
Glucose, Bld: 81 mg/dL (ref 70–99)
Potassium: 3.8 mEq/L (ref 3.5–5.3)
Sodium: 139 mEq/L (ref 135–145)
Total Bilirubin: 0.6 mg/dL (ref 0.2–1.2)
Total Protein: 6.9 g/dL (ref 6.0–8.3)

## 2014-02-16 LAB — TSH: TSH: 2.589 u[IU]/mL (ref 0.350–4.500)

## 2014-02-16 NOTE — Patient Instructions (Signed)

## 2014-02-16 NOTE — Progress Notes (Signed)
    SUBJECTIVE:  28 y.o. female for annual routine Pap and checkup. Has h/o of ASCUS with HR HPV with colpo last pregnancy.  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  Husband is supposed to be having a vasectomy.  Past Medical History  Diagnosis Date  . Abnormal Pap smear 2010  . Infection     chylamidia  . Asthma   . Preeclampsia 2113   Past Surgical History  Procedure Laterality Date  . Wisdom tooth extraction     Family History  Problem Relation Age of Onset  . Cancer Maternal Grandfather     lung cancer   History   Social History  . Marital Status: Married    Spouse Name: N/A    Number of Children: N/A  . Years of Education: N/A   Occupational History  . Not on file.   Social History Main Topics  . Smoking status: Former Smoker    Types: Cigarettes    Quit date: 07/09/2011  . Smokeless tobacco: Never Used  . Alcohol Use: Yes     Comment: social, not while pregnancy  . Drug Use: No  . Sexual Activity: Yes    Partners: Male    Birth Control/ Protection: None, Pill   Other Topics Concern  . Not on file   Social History Narrative  . No narrative on file    Current Outpatient Prescriptions  Medication Sig Dispense Refill  . acetaminophen (TYLENOL) 325 MG tablet Take 325 mg by mouth every 6 (six) hours as needed for mild pain.      . cetirizine (ZYRTEC) 10 MG tablet Take 10 mg by mouth daily as needed. For allergies      . norethindrone (MICRONOR,CAMILA,ERRIN) 0.35 MG tablet Take 1 tablet (0.35 mg total) by mouth daily.  1 Package  11  . Prenatal Vit-Fe Fumarate-FA (PRENATAL MULTIVITAMIN) TABS Take 1 tablet by mouth at bedtime.       No current facility-administered medications for this visit.   Allergies: Review of patient's allergies indicates no known allergies.   ROS:  Feeling well. No dyspnea or chest pain on exertion.  No abdominal pain, change in bowel habits, black or bloody stools.  No urinary tract symptoms. GYN  ROS: no breast pain or new or enlarging lumps on self exam. No neurological complaints.  OBJECTIVE:  The patient appears well, alert, oriented x 3, in no distress. BP 116/75  Pulse 72  Ht 5\' 3"  (1.6 m)  Wt 129 lb (58.514 kg)  BMI 22.86 kg/m2  Breastfeeding? Yes Gen: WD/WN female in NAD Head: [place]/AT Neck: supple. No adenopathy or thyromegaly. Lungs:  clear, good air entry, no wheezes, rhonchi or rales. CV: S1 and S2 normal, no murmurs, regular rate and rhythm.  Abdomen soft without tenderness, guarding, mass or organomegaly.  Extremities show no edema, normal peripheral pulses. Neurological is normal, no focal findings. BREAST EXAM: breasts appear normal, no suspicious masses, no skin or nipple changes or axillary nodes PELVIC EXAM: normal external genitalia, vulva, vagina, cervix, uterus and adnexa  ASSESSMENT:  Patient Active Problem List   Diagnosis Date Noted  . Genital warts 12/17/2011  . ASCUS +HRHPV in pregnancy 09/05/2011  . Asthma with allergic rhinitis 08/07/2011     PLAN:  pap smear--will call with results of pap smear. return annually or prn

## 2014-02-23 LAB — PAP LB, RFX HPV ASCU: PAP Smear Comment: 0

## 2014-08-08 ENCOUNTER — Encounter: Payer: Self-pay | Admitting: Family Medicine

## 2015-02-22 ENCOUNTER — Encounter: Payer: Self-pay | Admitting: Family Medicine

## 2015-02-22 ENCOUNTER — Ambulatory Visit (INDEPENDENT_AMBULATORY_CARE_PROVIDER_SITE_OTHER): Payer: 59 | Admitting: Family Medicine

## 2015-02-22 VITALS — BP 114/76 | HR 88 | Wt 120.0 lb

## 2015-02-22 DIAGNOSIS — Z124 Encounter for screening for malignant neoplasm of cervix: Secondary | ICD-10-CM | POA: Diagnosis not present

## 2015-02-22 DIAGNOSIS — Z01419 Encounter for gynecological examination (general) (routine) without abnormal findings: Secondary | ICD-10-CM

## 2015-02-22 DIAGNOSIS — Z3009 Encounter for other general counseling and advice on contraception: Secondary | ICD-10-CM

## 2015-02-22 NOTE — Patient Instructions (Addendum)
Preventive Care for Adults A healthy lifestyle and preventive care can promote health and wellness. Preventive health guidelines for women include the following key practices.  A routine yearly physical is a good way to check with your health care provider about your health and preventive screening. It is a chance to share any concerns and updates on your health and to receive a thorough exam.  Visit your dentist for a routine exam and preventive care every 6 months. Brush your teeth twice a day and floss once a day. Good oral hygiene prevents tooth decay and gum disease.  The frequency of eye exams is based on your age, health, family medical history, use of contact lenses, and other factors. Follow your health care provider's recommendations for frequency of eye exams.  Eat a healthy diet. Foods like vegetables, fruits, whole grains, low-fat dairy products, and lean protein foods contain the nutrients you need without too many calories. Decrease your intake of foods high in solid fats, added sugars, and salt. Eat the right amount of calories for you.Get information about a proper diet from your health care provider, if necessary.  Regular physical exercise is one of the most important things you can do for your health. Most adults should get at least 150 minutes of moderate-intensity exercise (any activity that increases your heart rate and causes you to sweat) each week. In addition, most adults need muscle-strengthening exercises on 2 or more days a week.  Maintain a healthy weight. The body mass index (BMI) is a screening tool to identify possible weight problems. It provides an estimate of body fat based on height and weight. Your health care provider can find your BMI and can help you achieve or maintain a healthy weight.For adults 20 years and older:  A BMI below 18.5 is considered underweight.  A BMI of 18.5 to 24.9 is normal.  A BMI of 25 to 29.9 is considered overweight.  A BMI of  30 and above is considered obese.  Maintain normal blood lipids and cholesterol levels by exercising and minimizing your intake of saturated fat. Eat a balanced diet with plenty of fruit and vegetables. Blood tests for lipids and cholesterol should begin at age 76 and be repeated every 5 years. If your lipid or cholesterol levels are high, you are over 50, or you are at high risk for heart disease, you may need your cholesterol levels checked more frequently.Ongoing high lipid and cholesterol levels should be treated with medicines if diet and exercise are not working.  If you smoke, find out from your health care provider how to quit. If you do not use tobacco, do not start.  Lung cancer screening is recommended for adults aged 22-80 years who are at high risk for developing lung cancer because of a history of smoking. A yearly low-dose CT scan of the lungs is recommended for people who have at least a 30-pack-year history of smoking and are a current smoker or have quit within the past 15 years. A pack year of smoking is smoking an average of 1 pack of cigarettes a day for 1 year (for example: 1 pack a day for 30 years or 2 packs a day for 15 years). Yearly screening should continue until the smoker has stopped smoking for at least 15 years. Yearly screening should be stopped for people who develop a health problem that would prevent them from having lung cancer treatment.  If you are pregnant, do not drink alcohol. If you are breastfeeding,  be very cautious about drinking alcohol. If you are not pregnant and choose to drink alcohol, do not have more than 1 drink per day. One drink is considered to be 12 ounces (355 mL) of beer, 5 ounces (148 mL) of wine, or 1.5 ounces (44 mL) of liquor.  Avoid use of street drugs. Do not share needles with anyone. Ask for help if you need support or instructions about stopping the use of drugs.  High blood pressure causes heart disease and increases the risk of  stroke. Your blood pressure should be checked at least every 1 to 2 years. Ongoing high blood pressure should be treated with medicines if weight loss and exercise do not work.  If you are 75-52 years old, ask your health care provider if you should take aspirin to prevent strokes.  Diabetes screening involves taking a blood sample to check your fasting blood sugar level. This should be done once every 3 years, after age 15, if you are within normal weight and without risk factors for diabetes. Testing should be considered at a younger age or be carried out more frequently if you are overweight and have at least 1 risk factor for diabetes.  Breast cancer screening is essential preventive care for women. You should practice "breast self-awareness." This means understanding the normal appearance and feel of your breasts and may include breast self-examination. Any changes detected, no matter how small, should be reported to a health care provider. Women in their 58s and 30s should have a clinical breast exam (CBE) by a health care provider as part of a regular health exam every 1 to 3 years. After age 16, women should have a CBE every year. Starting at age 53, women should consider having a mammogram (breast X-ray test) every year. Women who have a family history of breast cancer should talk to their health care provider about genetic screening. Women at a high risk of breast cancer should talk to their health care providers about having an MRI and a mammogram every year.  Breast cancer gene (BRCA)-related cancer risk assessment is recommended for women who have family members with BRCA-related cancers. BRCA-related cancers include breast, ovarian, tubal, and peritoneal cancers. Having family members with these cancers may be associated with an increased risk for harmful changes (mutations) in the breast cancer genes BRCA1 and BRCA2. Results of the assessment will determine the need for genetic counseling and  BRCA1 and BRCA2 testing.  Routine pelvic exams to screen for cancer are no longer recommended for nonpregnant women who are considered low risk for cancer of the pelvic organs (ovaries, uterus, and vagina) and who do not have symptoms. Ask your health care provider if a screening pelvic exam is right for you.  If you have had past treatment for cervical cancer or a condition that could lead to cancer, you need Pap tests and screening for cancer for at least 20 years after your treatment. If Pap tests have been discontinued, your risk factors (such as having a new sexual partner) need to be reassessed to determine if screening should be resumed. Some women have medical problems that increase the chance of getting cervical cancer. In these cases, your health care provider may recommend more frequent screening and Pap tests.  The HPV test is an additional test that may be used for cervical cancer screening. The HPV test looks for the virus that can cause the cell changes on the cervix. The cells collected during the Pap test can be  tested for HPV. The HPV test could be used to screen women aged 30 years and older, and should be used in women of any age who have unclear Pap test results. After the age of 30, women should have HPV testing at the same frequency as a Pap test.  Colorectal cancer can be detected and often prevented. Most routine colorectal cancer screening begins at the age of 50 years and continues through age 75 years. However, your health care provider may recommend screening at an earlier age if you have risk factors for colon cancer. On a yearly basis, your health care provider may provide home test kits to check for hidden blood in the stool. Use of a small camera at the end of a tube, to directly examine the colon (sigmoidoscopy or colonoscopy), can detect the earliest forms of colorectal cancer. Talk to your health care provider about this at age 50, when routine screening begins. Direct  exam of the colon should be repeated every 5-10 years through age 75 years, unless early forms of pre-cancerous polyps or small growths are found.  People who are at an increased risk for hepatitis B should be screened for this virus. You are considered at high risk for hepatitis B if:  You were born in a country where hepatitis B occurs often. Talk with your health care provider about which countries are considered high risk.  Your parents were born in a high-risk country and you have not received a shot to protect against hepatitis B (hepatitis B vaccine).  You have HIV or AIDS.  You use needles to inject street drugs.  You live with, or have sex with, someone who has hepatitis B.  You get hemodialysis treatment.  You take certain medicines for conditions like cancer, organ transplantation, and autoimmune conditions.  Hepatitis C blood testing is recommended for all people born from 1945 through 1965 and any individual with known risks for hepatitis C.  Practice safe sex. Use condoms and avoid high-risk sexual practices to reduce the spread of sexually transmitted infections (STIs). STIs include gonorrhea, chlamydia, syphilis, trichomonas, herpes, HPV, and human immunodeficiency virus (HIV). Herpes, HIV, and HPV are viral illnesses that have no cure. They can result in disability, cancer, and death.  You should be screened for sexually transmitted illnesses (STIs) including gonorrhea and chlamydia if:  You are sexually active and are younger than 24 years.  You are older than 24 years and your health care provider tells you that you are at risk for this type of infection.  Your sexual activity has changed since you were last screened and you are at an increased risk for chlamydia or gonorrhea. Ask your health care provider if you are at risk.  If you are at risk of being infected with HIV, it is recommended that you take a prescription medicine daily to prevent HIV infection. This is  called preexposure prophylaxis (PrEP). You are considered at risk if:  You are a heterosexual woman, are sexually active, and are at increased risk for HIV infection.  You take drugs by injection.  You are sexually active with a partner who has HIV.  Talk with your health care provider about whether you are at high risk of being infected with HIV. If you choose to begin PrEP, you should first be tested for HIV. You should then be tested every 3 months for as long as you are taking PrEP.  Osteoporosis is a disease in which the bones lose minerals and strength   with aging. This can result in serious bone fractures or breaks. The risk of osteoporosis can be identified using a bone density scan. Women ages 65 years and over and women at risk for fractures or osteoporosis should discuss screening with their health care providers. Ask your health care provider whether you should take a calcium supplement or vitamin D to reduce the rate of osteoporosis.  Menopause can be associated with physical symptoms and risks. Hormone replacement therapy is available to decrease symptoms and risks. You should talk to your health care provider about whether hormone replacement therapy is right for you.  Use sunscreen. Apply sunscreen liberally and repeatedly throughout the day. You should seek shade when your shadow is shorter than you. Protect yourself by wearing long sleeves, pants, a wide-brimmed hat, and sunglasses year round, whenever you are outdoors.  Once a month, do a whole body skin exam, using a mirror to look at the skin on your back. Tell your health care provider of new moles, moles that have irregular borders, moles that are larger than a pencil eraser, or moles that have changed in shape or color.  Stay current with required vaccines (immunizations).  Influenza vaccine. All adults should be immunized every year.  Tetanus, diphtheria, and acellular pertussis (Td, Tdap) vaccine. Pregnant women should  receive 1 dose of Tdap vaccine during each pregnancy. The dose should be obtained regardless of the length of time since the last dose. Immunization is preferred during the 27th-36th week of gestation. An adult who has not previously received Tdap or who does not know her vaccine status should receive 1 dose of Tdap. This initial dose should be followed by tetanus and diphtheria toxoids (Td) booster doses every 10 years. Adults with an unknown or incomplete history of completing a 3-dose immunization series with Td-containing vaccines should begin or complete a primary immunization series including a Tdap dose. Adults should receive a Td booster every 10 years.  Varicella vaccine. An adult without evidence of immunity to varicella should receive 2 doses or a second dose if she has previously received 1 dose. Pregnant females who do not have evidence of immunity should receive the first dose after pregnancy. This first dose should be obtained before leaving the health care facility. The second dose should be obtained 4-8 weeks after the first dose.  Human papillomavirus (HPV) vaccine. Females aged 13-26 years who have not received the vaccine previously should obtain the 3-dose series. The vaccine is not recommended for use in pregnant females. However, pregnancy testing is not needed before receiving a dose. If a female is found to be pregnant after receiving a dose, no treatment is needed. In that case, the remaining doses should be delayed until after the pregnancy. Immunization is recommended for any person with an immunocompromised condition through the age of 26 years if she did not get any or all doses earlier. During the 3-dose series, the second dose should be obtained 4-8 weeks after the first dose. The third dose should be obtained 24 weeks after the first dose and 16 weeks after the second dose.  Zoster vaccine. One dose is recommended for adults aged 60 years or older unless certain conditions are  present.  Measles, mumps, and rubella (MMR) vaccine. Adults born before 1957 generally are considered immune to measles and mumps. Adults born in 1957 or later should have 1 or more doses of MMR vaccine unless there is a contraindication to the vaccine or there is laboratory evidence of immunity to   each of the three diseases. A routine second dose of MMR vaccine should be obtained at least 28 days after the first dose for students attending postsecondary schools, health care workers, or international travelers. People who received inactivated measles vaccine or an unknown type of measles vaccine during 1963-1967 should receive 2 doses of MMR vaccine. People who received inactivated mumps vaccine or an unknown type of mumps vaccine before 1979 and are at high risk for mumps infection should consider immunization with 2 doses of MMR vaccine. For females of childbearing age, rubella immunity should be determined. If there is no evidence of immunity, females who are not pregnant should be vaccinated. If there is no evidence of immunity, females who are pregnant should delay immunization until after pregnancy. Unvaccinated health care workers born before 1957 who lack laboratory evidence of measles, mumps, or rubella immunity or laboratory confirmation of disease should consider measles and mumps immunization with 2 doses of MMR vaccine or rubella immunization with 1 dose of MMR vaccine.  Pneumococcal 13-valent conjugate (PCV13) vaccine. When indicated, a person who is uncertain of her immunization history and has no record of immunization should receive the PCV13 vaccine. An adult aged 19 years or older who has certain medical conditions and has not been previously immunized should receive 1 dose of PCV13 vaccine. This PCV13 should be followed with a dose of pneumococcal polysaccharide (PPSV23) vaccine. The PPSV23 vaccine dose should be obtained at least 8 weeks after the dose of PCV13 vaccine. An adult aged 19  years or older who has certain medical conditions and previously received 1 or more doses of PPSV23 vaccine should receive 1 dose of PCV13. The PCV13 vaccine dose should be obtained 1 or more years after the last PPSV23 vaccine dose.  Pneumococcal polysaccharide (PPSV23) vaccine. When PCV13 is also indicated, PCV13 should be obtained first. All adults aged 65 years and older should be immunized. An adult younger than age 65 years who has certain medical conditions should be immunized. Any person who resides in a nursing home or long-term care facility should be immunized. An adult smoker should be immunized. People with an immunocompromised condition and certain other conditions should receive both PCV13 and PPSV23 vaccines. People with human immunodeficiency virus (HIV) infection should be immunized as soon as possible after diagnosis. Immunization during chemotherapy or radiation therapy should be avoided. Routine use of PPSV23 vaccine is not recommended for American Indians, [place] Natives, or people younger than 65 years unless there are medical conditions that require PPSV23 vaccine. When indicated, people who have unknown immunization and have no record of immunization should receive PPSV23 vaccine. One-time revaccination 5 years after the first dose of PPSV23 is recommended for people aged 19-64 years who have chronic kidney failure, nephrotic syndrome, asplenia, or immunocompromised conditions. People who received 1-2 doses of PPSV23 before age 65 years should receive another dose of PPSV23 vaccine at age 65 years or later if at least 5 years have passed since the previous dose. Doses of PPSV23 are not needed for people immunized with PPSV23 at or after age 65 years.  Meningococcal vaccine. Adults with asplenia or persistent complement component deficiencies should receive 2 doses of quadrivalent meningococcal conjugate (MenACWY-D) vaccine. The doses should be obtained at least 2 months apart.  Microbiologists working with certain meningococcal bacteria, military recruits, people at risk during an outbreak, and people who travel to or live in countries with a high rate of meningitis should be immunized. A first-year college student up through age   21 years who is living in a residence hall should receive a dose if she did not receive a dose on or after her 16th birthday. Adults who have certain high-risk conditions should receive one or more doses of vaccine.  Hepatitis A vaccine. Adults who wish to be protected from this disease, have certain high-risk conditions, work with hepatitis A-infected animals, work in hepatitis A research labs, or travel to or work in countries with a high rate of hepatitis A should be immunized. Adults who were previously unvaccinated and who anticipate close contact with an international adoptee during the first 60 days after arrival in the [place] from a country with a high rate of hepatitis A should be immunized.  Hepatitis B vaccine. Adults who wish to be protected from this disease, have certain high-risk conditions, may be exposed to blood or other infectious body fluids, are household contacts or sex partners of hepatitis B positive people, are clients or workers in certain care facilities, or travel to or work in countries with a high rate of hepatitis B should be immunized.  Haemophilus influenzae type b (Hib) vaccine. A previously unvaccinated person with asplenia or sickle cell disease or having a scheduled splenectomy should receive 1 dose of Hib vaccine. Regardless of previous immunization, a recipient of a hematopoietic stem cell transplant should receive a 3-dose series 6-12 months after her successful transplant. Hib vaccine is not recommended for adults with HIV infection. Preventive Services / Frequency Ages 64 to 68 years  Blood pressure check.** / Every 1 to 2 years.  Lipid and cholesterol check.** / Every 5 years beginning at age  22.  Clinical breast exam.** / Every 3 years for women in their 88s and 53s.  BRCA-related cancer risk assessment.** / For women who have family members with a BRCA-related cancer (breast, ovarian, tubal, or peritoneal cancers).  Pap test.** / Every 2 years from ages 90 through 51. Every 3 years starting at age 21 through age 56 or 3 with a history of 3 consecutive normal Pap tests.  HPV screening.** / Every 3 years from ages 24 through ages 1 to 46 with a history of 3 consecutive normal Pap tests.  Hepatitis C blood test.** / For any individual with known risks for hepatitis C.  Skin self-exam. / Monthly.  Influenza vaccine. / Every year.  Tetanus, diphtheria, and acellular pertussis (Tdap, Td) vaccine.** / Consult your health care provider. Pregnant women should receive 1 dose of Tdap vaccine during each pregnancy. 1 dose of Td every 10 years.  Varicella vaccine.** / Consult your health care provider. Pregnant females who do not have evidence of immunity should receive the first dose after pregnancy.  HPV vaccine. / 3 doses over 6 months, if 72 and younger. The vaccine is not recommended for use in pregnant females. However, pregnancy testing is not needed before receiving a dose.  Measles, mumps, rubella (MMR) vaccine.** / You need at least 1 dose of MMR if you were born in 1957 or later. You may also need a 2nd dose. For females of childbearing age, rubella immunity should be determined. If there is no evidence of immunity, females who are not pregnant should be vaccinated. If there is no evidence of immunity, females who are pregnant should delay immunization until after pregnancy.  Pneumococcal 13-valent conjugate (PCV13) vaccine.** / Consult your health care provider.  Pneumococcal polysaccharide (PPSV23) vaccine.** / 1 to 2 doses if you smoke cigarettes or if you have certain conditions.  Meningococcal vaccine.** /  1 dose if you are age 19 to 21 years and a first-year college  student living in a residence hall, or have one of several medical conditions, you need to get vaccinated against meningococcal disease. You may also need additional booster doses.  Hepatitis A vaccine.** / Consult your health care provider.  Hepatitis B vaccine.** / Consult your health care provider.  Haemophilus influenzae type b (Hib) vaccine.** / Consult your health care provider. Ages 40 to 64 years  Blood pressure check.** / Every 1 to 2 years.  Lipid and cholesterol check.** / Every 5 years beginning at age 20 years.  Lung cancer screening. / Every year if you are aged 55-80 years and have a 30-pack-year history of smoking and currently smoke or have quit within the past 15 years. Yearly screening is stopped once you have quit smoking for at least 15 years or develop a health problem that would prevent you from having lung cancer treatment.  Clinical breast exam.** / Every year after age 40 years.  BRCA-related cancer risk assessment.** / For women who have family members with a BRCA-related cancer (breast, ovarian, tubal, or peritoneal cancers).  Mammogram.** / Every year beginning at age 40 years and continuing for as long as you are in good health. Consult with your health care provider.  Pap test.** / Every 3 years starting at age 30 years through age 65 or 70 years with a history of 3 consecutive normal Pap tests.  HPV screening.** / Every 3 years from ages 30 years through ages 65 to 70 years with a history of 3 consecutive normal Pap tests.  Fecal occult blood test (FOBT) of stool. / Every year beginning at age 50 years and continuing until age 75 years. You may not need to do this test if you get a colonoscopy every 10 years.  Flexible sigmoidoscopy or colonoscopy.** / Every 5 years for a flexible sigmoidoscopy or every 10 years for a colonoscopy beginning at age 50 years and continuing until age 75 years.  Hepatitis C blood test.** / For all people born from 1945 through  1965 and any individual with known risks for hepatitis C.  Skin self-exam. / Monthly.  Influenza vaccine. / Every year.  Tetanus, diphtheria, and acellular pertussis (Tdap/Td) vaccine.** / Consult your health care provider. Pregnant women should receive 1 dose of Tdap vaccine during each pregnancy. 1 dose of Td every 10 years.  Varicella vaccine.** / Consult your health care provider. Pregnant females who do not have evidence of immunity should receive the first dose after pregnancy.  Zoster vaccine.** / 1 dose for adults aged 60 years or older.  Measles, mumps, rubella (MMR) vaccine.** / You need at least 1 dose of MMR if you were born in 1957 or later. You may also need a 2nd dose. For females of childbearing age, rubella immunity should be determined. If there is no evidence of immunity, females who are not pregnant should be vaccinated. If there is no evidence of immunity, females who are pregnant should delay immunization until after pregnancy.  Pneumococcal 13-valent conjugate (PCV13) vaccine.** / Consult your health care provider.  Pneumococcal polysaccharide (PPSV23) vaccine.** / 1 to 2 doses if you smoke cigarettes or if you have certain conditions.  Meningococcal vaccine.** / Consult your health care provider.  Hepatitis A vaccine.** / Consult your health care provider.  Hepatitis B vaccine.** / Consult your health care provider.  Haemophilus influenzae type b (Hib) vaccine.** / Consult your health care provider. Ages 65   years and over  Blood pressure check.** / Every 1 to 2 years.  Lipid and cholesterol check.** / Every 5 years beginning at age 70 years.  Lung cancer screening. / Every year if you are aged 3-80 years and have a 30-pack-year history of smoking and currently smoke or have quit within the past 15 years. Yearly screening is stopped once you have quit smoking for at least 15 years or develop a health problem that would prevent you from having lung cancer  treatment.  Clinical breast exam.** / Every year after age 103 years.  BRCA-related cancer risk assessment.** / For women who have family members with a BRCA-related cancer (breast, ovarian, tubal, or peritoneal cancers).  Mammogram.** / Every year beginning at age 74 years and continuing for as long as you are in good health. Consult with your health care provider.  Pap test.** / Every 3 years starting at age 14 years through age 59 or 84 years with 3 consecutive normal Pap tests. Testing can be stopped between 65 and 70 years with 3 consecutive normal Pap tests and no abnormal Pap or HPV tests in the past 10 years.  HPV screening.** / Every 3 years from ages 33 years through ages 53 or 59 years with a history of 3 consecutive normal Pap tests. Testing can be stopped between 65 and 70 years with 3 consecutive normal Pap tests and no abnormal Pap or HPV tests in the past 10 years.  Fecal occult blood test (FOBT) of stool. / Every year beginning at age 63 years and continuing until age 16 years. You may not need to do this test if you get a colonoscopy every 10 years.  Flexible sigmoidoscopy or colonoscopy.** / Every 5 years for a flexible sigmoidoscopy or every 10 years for a colonoscopy beginning at age 39 years and continuing until age 50 years.  Hepatitis C blood test.** / For all people born from [birth year] through 1966 and any individual with known risks for hepatitis C.  Osteoporosis screening.** / A one-time screening for women ages 60 years and over and women at risk for fractures or osteoporosis.  Skin self-exam. / Monthly.  Influenza vaccine. / Every year.  Tetanus, diphtheria, and acellular pertussis (Tdap/Td) vaccine.** / 1 dose of Td every 10 years.  Varicella vaccine.** / Consult your health care provider.  Zoster vaccine.** / 1 dose for adults aged 37 years or older.  Pneumococcal 13-valent conjugate (PCV13) vaccine.** / Consult your health care provider.  Pneumococcal  polysaccharide (PPSV23) vaccine.** / 1 dose for all adults aged 81 years and older.  Meningococcal vaccine.** / Consult your health care provider.  Hepatitis A vaccine.** / Consult your health care provider.  Hepatitis B vaccine.** / Consult your health care provider.  Haemophilus influenzae type b (Hib) vaccine.** / Consult your health care provider. ** Family history and personal history of risk and conditions may change your health care provider's recommendations. Document Released: 11/19/2001 Document Revised: 02/07/2014 Document Reviewed: 02/18/2011 [facility] Patient Information 2015 [place], [place]. This information is not intended to replace advice given to you by your health care provider. Make sure you discuss any questions you have with your health care provider. Levonorgestrel intrauterine device (IUD) What is this medicine? LEVONORGESTREL IUD (LEE voe nor jes trel) is a contraceptive (birth control) device. The device is placed inside the uterus by a healthcare professional. It is used to prevent pregnancy and can also be used to treat heavy bleeding that occurs during your period. Depending on  the device, it can be used for 3 to 5 years. This medicine may be used for other purposes; ask your health care provider or pharmacist if you have questions. COMMON BRAND NAME(S): Verda Cumins What should I tell my health care provider before I take this medicine? They need to know if you have any of these conditions: -abnormal Pap smear -cancer of the breast, uterus, or cervix -diabetes -endometritis -genital or pelvic infection now or in the past -have more than one sexual partner or your partner has more than one partner -heart disease -history of an ectopic or tubal pregnancy -immune system problems -IUD in place -liver disease or tumor -problems with blood clots or take blood-thinners -use intravenous drugs -uterus of unusual shape -vaginal bleeding that has not been  explained -an unusual or allergic reaction to levonorgestrel, other hormones, silicone, or polyethylene, medicines, foods, dyes, or preservatives -pregnant or trying to get pregnant -breast-feeding How should I use this medicine? This device is placed inside the uterus by a health care professional. Talk to your pediatrician regarding the use of this medicine in children. Special care may be needed. Overdosage: If you think you have taken too much of this medicine contact a poison control center or emergency room at once. NOTE: This medicine is only for you. Do not share this medicine with others. What if I miss a dose? This does not apply. What may interact with this medicine? Do not take this medicine with any of the following medications: -amprenavir -bosentan -fosamprenavir This medicine may also interact with the following medications: -aprepitant -barbiturate medicines for inducing sleep or treating seizures -bexarotene -griseofulvin -medicines to treat seizures like carbamazepine, ethotoin, felbamate, oxcarbazepine, phenytoin, topiramate -modafinil -pioglitazone -rifabutin -rifampin -rifapentine -some medicines to treat HIV infection like atazanavir, indinavir, lopinavir, nelfinavir, tipranavir, ritonavir -St. John's wort -warfarin This list may not describe all possible interactions. Give your health care provider a list of all the medicines, herbs, non-prescription drugs, or dietary supplements you use. Also tell them if you smoke, drink alcohol, or use illegal drugs. Some items may interact with your medicine. What should I watch for while using this medicine? Visit your doctor or health care professional for regular check ups. See your doctor if you or your partner has sexual contact with others, becomes HIV positive, or gets a sexual transmitted disease. This product does not protect you against HIV infection (AIDS) or other sexually transmitted diseases. You can check  the placement of the IUD yourself by reaching up to the top of your vagina with clean fingers to feel the threads. Do not pull on the threads. It is a good habit to check placement after each menstrual period. Call your doctor right away if you feel more of the IUD than just the threads or if you cannot feel the threads at all. The IUD may come out by itself. You may become pregnant if the device comes out. If you notice that the IUD has come out use a backup birth control method like condoms and call your health care provider. Using tampons will not change the position of the IUD and are okay to use during your period. What side effects may I notice from receiving this medicine? Side effects that you should report to your doctor or health care professional as soon as possible: -allergic reactions like skin rash, itching or hives, swelling of the face, lips, or tongue -fever, flu-like symptoms -genital sores -high blood pressure -no menstrual period for 6 weeks during  use -pain, swelling, warmth in the leg -pelvic pain or tenderness -severe or sudden headache -signs of pregnancy -stomach cramping -sudden shortness of breath -trouble with balance, talking, or walking -unusual vaginal bleeding, discharge -yellowing of the eyes or skin Side effects that usually do not require medical attention (report to your doctor or health care professional if they continue or are bothersome): -acne -breast pain -change in sex drive or performance -changes in weight -cramping, dizziness, or faintness while the device is being inserted -headache -irregular menstrual bleeding within first 3 to 6 months of use -nausea This list may not describe all possible side effects. Call your doctor for medical advice about side effects. You may report side effects to FDA at 1-800-FDA-1088. Where should I keep my medicine? This does not apply. NOTE: This sheet is a summary. It may not cover all possible information.  If you have questions about this medicine, talk to your doctor, pharmacist, or health care provider.  2015, [employer]. (2011-10-24 13:54:04)

## 2015-02-22 NOTE — Progress Notes (Signed)
  Subjective:     Gloria Dean is a 29 y.o. female and is here for a comprehensive physical exam. The patient reports problems - increasing anxiety and OCD.  She would like to be seen for this issue.  Also is interested in contraception. She has been on OC's in the past and has difficulty remembering pills.  Also, notes a lot of cramping with cycles.  History   Social History  . Marital Status: Married    Spouse Name: N/A  . Number of Children: N/A  . Years of Education: N/A   Occupational History  . Not on file.   Social History Main Topics  . Smoking status: Former Smoker    Types: Cigarettes    Quit date: 07/09/2011  . Smokeless tobacco: Never Used  . Alcohol Use: Yes     Comment: social, not while pregnancy  . Drug Use: No  . Sexual Activity:    Partners: Male    Birth Control/ Protection: None, Pill   Other Topics Concern  . Not on file   Social History Narrative   Health Maintenance  Topic Date Due  . INFLUENZA VACCINE  05/08/2015  . PAP SMEAR  02/16/2017  . TETANUS/TDAP  09/08/2023  . HIV Screening  Completed    The following portions of the patient's history were reviewed and updated as appropriate: allergies, current medications, past family history, past medical history, past social history, past surgical history and problem list.  Review of Systems Pertinent items are noted in HPI.   Objective:    BP 114/76 mmHg  Pulse 88  Wt 120 lb (54.432 kg)  LMP 01/30/2015 General appearance: alert, cooperative and appears stated age Head: Normocephalic, without obvious abnormality, atraumatic Neck: no adenopathy, supple, symmetrical, trachea midline and thyroid not enlarged, symmetric, no tenderness/mass/nodules Lungs: clear to auscultation bilaterally Breasts: normal appearance, no masses or tenderness Heart: regular rate and rhythm, S1, S2 normal, no murmur, click, rub or gallop Abdomen: soft, non-tender; bowel sounds normal; no masses,  no  organomegaly Pelvic: cervix normal in appearance, external genitalia normal, no adnexal masses or tenderness, no cervical motion tenderness, uterus normal size, shape, and consistency and vagina normal without discharge Extremities: extremities normal, atraumatic, no cyanosis or edema Pulses: 2+ and symmetric Skin: Skin color, texture, turgor normal. No rashes or lesions Lymph nodes: Cervical, supraclavicular, and axillary nodes normal. Neurologic: Grossly normal    Assessment:    Healthy female exam.      Plan:      Problem List Items Addressed This Visit    None    Visit Diagnoses    Screening for malignant neoplasm of cervix    -  Primary    Relevant Orders    Cytology - PAP    Encounter for routine gynecological examination        Encounter for other general counseling or advice on contraception        Would like 3 year IUD-->will return for insertion with menses.      Carman Essick S 02/22/2015 4:05 PM  See After Visit Summary for Counseling Recommendations

## 2015-02-24 LAB — CYTOLOGY - PAP

## 2015-02-28 ENCOUNTER — Encounter: Payer: Self-pay | Admitting: Family Medicine

## 2015-02-28 ENCOUNTER — Ambulatory Visit (INDEPENDENT_AMBULATORY_CARE_PROVIDER_SITE_OTHER): Payer: 59 | Admitting: Family Medicine

## 2015-02-28 VITALS — BP 112/73 | HR 77 | Ht 63.0 in | Wt 120.0 lb

## 2015-02-28 DIAGNOSIS — Z01812 Encounter for preprocedural laboratory examination: Secondary | ICD-10-CM | POA: Diagnosis not present

## 2015-02-28 DIAGNOSIS — Z30430 Encounter for insertion of intrauterine contraceptive device: Secondary | ICD-10-CM | POA: Diagnosis not present

## 2015-02-28 LAB — POCT URINE PREGNANCY: Preg Test, Ur: NEGATIVE

## 2015-02-28 MED ORDER — LEVONORGESTREL 20 MCG/24HR IU IUD: 1.0000 | INTRAUTERINE_SYSTEM | Freq: Once | INTRAUTERINE | Status: DC

## 2015-02-28 MED ORDER — LEVONORGESTREL 18.6 MCG/DAY IU IUD: 1.0000 | INTRAUTERINE_SYSTEM | Freq: Once | INTRAUTERINE | Status: DC

## 2015-02-28 NOTE — Progress Notes (Signed)
    Subjective:    Patient ID: Gloria Dean is a 29 y.o. female presenting with Contraception  on 02/28/2015  HPI: Here for IUD insertion.  Review of Systems  Constitutional: Negative for fever.  Gastrointestinal: Negative for abdominal pain.      Objective:    BP 112/73 mmHg  Pulse 77  Ht 5\' 3"  (1.6 m)  Wt 120 lb (54.432 kg)  BMI 21.26 kg/m2  LMP 01/30/2015 (Exact Date) Physical Exam  Constitutional: She appears well-developed and well-nourished. No distress.  Genitourinary: Vagina normal.   Procedure: Patient identified, informed consent performed, signed copy in chart, time out was performed.  Urine pregnancy test negative.  Speculum placed in the vagina.  Cervix visualized.  Cleaned with Betadine x 2.  Grasped anteriorly with a single tooth tenaculum.  Uterus sounded to 7 cm.  Lilettaa IUD placed per manufacturer's recommendations.  Strings trimmed to 3 cm.   Patient given post procedure instructions and Mirena care card with expiration date.  Patient is asked to check IUD strings periodically and follow up in 4-6 weeks for IUD check if not felt.       Assessment & Plan:   Problem List Items Addressed This Visit    None    Visit Diagnoses    Pre-procedure lab exam    -  Primary    Relevant Orders    POCT urine pregnancy (Completed)    Encounter for IUD insertion        Relevant Medications    levonorgestrel (LILETTA) 18.6 MCG/DAY IUD IUD       Return in about 4 weeks (around 03/28/2015) for iud check.  Damaria Vachon S 02/28/2015 1:30 PM

## 2015-03-29 ENCOUNTER — Ambulatory Visit: Payer: 59 | Admitting: Family Medicine

## 2015-03-29 DIAGNOSIS — Z30431 Encounter for routine checking of intrauterine contraceptive device: Secondary | ICD-10-CM

## 2016-05-27 ENCOUNTER — Encounter: Payer: Self-pay | Admitting: Family Medicine

## 2016-05-27 ENCOUNTER — Ambulatory Visit (INDEPENDENT_AMBULATORY_CARE_PROVIDER_SITE_OTHER): Payer: 59 | Admitting: Family Medicine

## 2016-05-27 VITALS — BP 105/70 | HR 73 | Resp 18 | Ht 63.0 in | Wt 128.0 lb

## 2016-05-27 DIAGNOSIS — Z01419 Encounter for gynecological examination (general) (routine) without abnormal findings: Secondary | ICD-10-CM | POA: Diagnosis not present

## 2016-05-27 DIAGNOSIS — Z124 Encounter for screening for malignant neoplasm of cervix: Secondary | ICD-10-CM | POA: Diagnosis not present

## 2016-05-27 DIAGNOSIS — Z1151 Encounter for screening for human papillomavirus (HPV): Secondary | ICD-10-CM | POA: Diagnosis not present

## 2016-05-27 NOTE — Patient Instructions (Signed)
Preventive Care for Adults, Female A healthy lifestyle and preventive care can promote health and wellness. Preventive health guidelines for women include the following key practices.  A routine yearly physical is a good way to check with your health care provider about your health and preventive screening. It is a chance to share any concerns and updates on your health and to receive a thorough exam.  Visit your dentist for a routine exam and preventive care every 6 months. Brush your teeth twice a day and floss once a day. Good oral hygiene prevents tooth decay and gum disease.  The frequency of eye exams is based on your age, health, family medical history, use of contact lenses, and other factors. Follow your health care provider's recommendations for frequency of eye exams.  Eat a healthy diet. Foods like vegetables, fruits, whole grains, low-fat dairy products, and lean protein foods contain the nutrients you need without too many calories. Decrease your intake of foods high in solid fats, added sugars, and salt. Eat the right amount of calories for you.Get information about a proper diet from your health care provider, if necessary.  Regular physical exercise is one of the most important things you can do for your health. Most adults should get at least 150 minutes of moderate-intensity exercise (any activity that increases your heart rate and causes you to sweat) each week. In addition, most adults need muscle-strengthening exercises on 2 or more days a week.  Maintain a healthy weight. The body mass index (BMI) is a screening tool to identify possible weight problems. It provides an estimate of body fat based on height and weight. Your health care provider can find your BMI and can help you achieve or maintain a healthy weight.For adults 20 years and older:  A BMI below 18.5 is considered underweight.  A BMI of 18.5 to 24.9 is normal.  A BMI of 25 to 29.9 is considered overweight.  A  BMI of 30 and above is considered obese.  Maintain normal blood lipids and cholesterol levels by exercising and minimizing your intake of saturated fat. Eat a balanced diet with plenty of fruit and vegetables. Blood tests for lipids and cholesterol should begin at age 82 and be repeated every 5 years. If your lipid or cholesterol levels are high, you are over 50, or you are at high risk for heart disease, you may need your cholesterol levels checked more frequently.Ongoing high lipid and cholesterol levels should be treated with medicines if diet and exercise are not working.  If you smoke, find out from your health care provider how to quit. If you do not use tobacco, do not start.  Lung cancer screening is recommended for adults aged 10-80 years who are at high risk for developing lung cancer because of a history of smoking. A yearly low-dose CT scan of the lungs is recommended for people who have at least a 30-pack-year history of smoking and are a current smoker or have quit within the past 15 years. A pack year of smoking is smoking an average of 1 pack of cigarettes a day for 1 year (for example: 1 pack a day for 30 years or 2 packs a day for 15 years). Yearly screening should continue until the smoker has stopped smoking for at least 15 years. Yearly screening should be stopped for people who develop a health problem that would prevent them from having lung cancer treatment.  If you are pregnant, do not drink alcohol. If you are  breastfeeding, be very cautious about drinking alcohol. If you are not pregnant and choose to drink alcohol, do not have more than 1 drink per day. One drink is considered to be 12 ounces (355 mL) of beer, 5 ounces (148 mL) of wine, or 1.5 ounces (44 mL) of liquor.  Avoid use of street drugs. Do not share needles with anyone. Ask for help if you need support or instructions about stopping the use of drugs.  High blood pressure causes heart disease and increases the risk  of stroke. Your blood pressure should be checked at least every 1 to 2 years. Ongoing high blood pressure should be treated with medicines if weight loss and exercise do not work.  If you are 70-34 years old, ask your health care provider if you should take aspirin to prevent strokes.  Diabetes screening is done by taking a blood sample to check your blood glucose level after you have not eaten for a certain period of time (fasting). If you are not overweight and you do not have risk factors for diabetes, you should be screened once every 3 years starting at age 43. If you are overweight or obese and you are 71-45 years of age, you should be screened for diabetes every year as part of your cardiovascular risk assessment.  Breast cancer screening is essential preventive care for women. You should practice "breast self-awareness." This means understanding the normal appearance and feel of your breasts and may include breast self-examination. Any changes detected, no matter how small, should be reported to a health care provider. Women in their 72s and 30s should have a clinical breast exam (CBE) by a health care provider as part of a regular health exam every 1 to 3 years. After age 67, women should have a CBE every year. Starting at age 74, women should consider having a mammogram (breast X-ray test) every year. Women who have a family history of breast cancer should talk to their health care provider about genetic screening. Women at a high risk of breast cancer should talk to their health care providers about having an MRI and a mammogram every year.  Breast cancer gene (BRCA)-related cancer risk assessment is recommended for women who have family members with BRCA-related cancers. BRCA-related cancers include breast, ovarian, tubal, and peritoneal cancers. Having family members with these cancers may be associated with an increased risk for harmful changes (mutations) in the breast cancer genes BRCA1 and  BRCA2. Results of the assessment will determine the need for genetic counseling and BRCA1 and BRCA2 testing.  Your health care provider may recommend that you be screened regularly for cancer of the pelvic organs (ovaries, uterus, and vagina). This screening involves a pelvic examination, including checking for microscopic changes to the surface of your cervix (Pap test). You may be encouraged to have this screening done every 3 years, beginning at age 62.  For women ages 57-65, health care providers may recommend pelvic exams and Pap testing every 3 years, or they may recommend the Pap and pelvic exam, combined with testing for human papilloma virus (HPV), every 5 years. Some types of HPV increase your risk of cervical cancer. Testing for HPV may also be done on women of any age with unclear Pap test results.  Other health care providers may not recommend any screening for nonpregnant women who are considered low risk for pelvic cancer and who do not have symptoms. Ask your health care provider if a screening pelvic exam is right for  you.  If you have had past treatment for cervical cancer or a condition that could lead to cancer, you need Pap tests and screening for cancer for at least 20 years after your treatment. If Pap tests have been discontinued, your risk factors (such as having a new sexual partner) need to be reassessed to determine if screening should resume. Some women have medical problems that increase the chance of getting cervical cancer. In these cases, your health care provider may recommend more frequent screening and Pap tests.  Colorectal cancer can be detected and often prevented. Most routine colorectal cancer screening begins at the age of 62 years and continues through age 33 years. However, your health care provider may recommend screening at an earlier age if you have risk factors for colon cancer. On a yearly basis, your health care provider may provide home test kits to check  for hidden blood in the stool. Use of a small camera at the end of a tube, to directly examine the colon (sigmoidoscopy or colonoscopy), can detect the earliest forms of colorectal cancer. Talk to your health care provider about this at age 67, when routine screening begins. Direct exam of the colon should be repeated every 5-10 years through age 46 years, unless early forms of precancerous polyps or small growths are found.  People who are at an increased risk for hepatitis B should be screened for this virus. You are considered at high risk for hepatitis B if:  You were born in a country where hepatitis B occurs often. Talk with your health care provider about which countries are considered high risk.  Your parents were born in a high-risk country and you have not received a shot to protect against hepatitis B (hepatitis B vaccine).  You have HIV or AIDS.  You use needles to inject street drugs.  You live with, or have sex with, someone who has hepatitis B.  You get hemodialysis treatment.  You take certain medicines for conditions like cancer, organ transplantation, and autoimmune conditions.  Hepatitis C blood testing is recommended for all people born from [birth year] through 1965 and any individual with known risks for hepatitis C.  Practice safe sex. Use condoms and avoid high-risk sexual practices to reduce the spread of sexually transmitted infections (STIs). STIs include gonorrhea, chlamydia, syphilis, trichomonas, herpes, HPV, and human immunodeficiency virus (HIV). Herpes, HIV, and HPV are viral illnesses that have no cure. They can result in disability, cancer, and death.  You should be screened for sexually transmitted illnesses (STIs) including gonorrhea and chlamydia if:  You are sexually active and are younger than 24 years.  You are older than 24 years and your health care provider tells you that you are at risk for this type of infection.  Your sexual activity has changed  since you were last screened and you are at an increased risk for chlamydia or gonorrhea. Ask your health care provider if you are at risk.  If you are at risk of being infected with HIV, it is recommended that you take a prescription medicine daily to prevent HIV infection. This is called preexposure prophylaxis (PrEP). You are considered at risk if:  You are sexually active and do not regularly use condoms or know the HIV status of your partner(s).  You take drugs by injection.  You are sexually active with a partner who has HIV.  Talk with your health care provider about whether you are at high risk of being infected with HIV. If  you choose to begin PrEP, you should first be tested for HIV. You should then be tested every 3 months for as long as you are taking PrEP.  Osteoporosis is a disease in which the bones lose minerals and strength with aging. This can result in serious bone fractures or breaks. The risk of osteoporosis can be identified using a bone density scan. Women ages 101 years and over and women at risk for fractures or osteoporosis should discuss screening with their health care providers. Ask your health care provider whether you should take a calcium supplement or vitamin D to reduce the rate of osteoporosis.  Menopause can be associated with physical symptoms and risks. Hormone replacement therapy is available to decrease symptoms and risks. You should talk to your health care provider about whether hormone replacement therapy is right for you.  Use sunscreen. Apply sunscreen liberally and repeatedly throughout the day. You should seek shade when your shadow is shorter than you. Protect yourself by wearing long sleeves, pants, a wide-brimmed hat, and sunglasses year round, whenever you are outdoors.  Once a month, do a whole body skin exam, using a mirror to look at the skin on your back. Tell your health care provider of new moles, moles that have irregular borders, moles that  are larger than a pencil eraser, or moles that have changed in shape or color.  Stay current with required vaccines (immunizations).  Influenza vaccine. All adults should be immunized every year.  Tetanus, diphtheria, and acellular pertussis (Td, Tdap) vaccine. Pregnant women should receive 1 dose of Tdap vaccine during each pregnancy. The dose should be obtained regardless of the length of time since the last dose. Immunization is preferred during the 27th-36th week of gestation. An adult who has not previously received Tdap or who does not know her vaccine status should receive 1 dose of Tdap. This initial dose should be followed by tetanus and diphtheria toxoids (Td) booster doses every 10 years. Adults with an unknown or incomplete history of completing a 3-dose immunization series with Td-containing vaccines should begin or complete a primary immunization series including a Tdap dose. Adults should receive a Td booster every 10 years.  Varicella vaccine. An adult without evidence of immunity to varicella should receive 2 doses or a second dose if she has previously received 1 dose. Pregnant females who do not have evidence of immunity should receive the first dose after pregnancy. This first dose should be obtained before leaving the health care facility. The second dose should be obtained 4-8 weeks after the first dose.  Human papillomavirus (HPV) vaccine. Females aged 13-26 years who have not received the vaccine previously should obtain the 3-dose series. The vaccine is not recommended for use in pregnant females. However, pregnancy testing is not needed before receiving a dose. If a female is found to be pregnant after receiving a dose, no treatment is needed. In that case, the remaining doses should be delayed until after the pregnancy. Immunization is recommended for any person with an immunocompromised condition through the age of 22 years if she did not get any or all doses earlier. During the  3-dose series, the second dose should be obtained 4-8 weeks after the first dose. The third dose should be obtained 24 weeks after the first dose and 16 weeks after the second dose.  Zoster vaccine. One dose is recommended for adults aged 14 years or older unless certain conditions are present.  Measles, mumps, and rubella (MMR) vaccine. Adults born  before 1957 generally are considered immune to measles and mumps. Adults born in 1957 or later should have 1 or more doses of MMR vaccine unless there is a contraindication to the vaccine or there is laboratory evidence of immunity to each of the three diseases. A routine second dose of MMR vaccine should be obtained at least 28 days after the first dose for students attending postsecondary schools, health care workers, or international travelers. People who received inactivated measles vaccine or an unknown type of measles vaccine during 1963-1967 should receive 2 doses of MMR vaccine. People who received inactivated mumps vaccine or an unknown type of mumps vaccine before 1979 and are at high risk for mumps infection should consider immunization with 2 doses of MMR vaccine. For females of childbearing age, rubella immunity should be determined. If there is no evidence of immunity, females who are not pregnant should be vaccinated. If there is no evidence of immunity, females who are pregnant should delay immunization until after pregnancy. Unvaccinated health care workers born before 1957 who lack laboratory evidence of measles, mumps, or rubella immunity or laboratory confirmation of disease should consider measles and mumps immunization with 2 doses of MMR vaccine or rubella immunization with 1 dose of MMR vaccine.  Pneumococcal 13-valent conjugate (PCV13) vaccine. When indicated, a person who is uncertain of his immunization history and has no record of immunization should receive the PCV13 vaccine. All adults 65 years of age and older should receive this  vaccine. An adult aged 19 years or older who has certain medical conditions and has not been previously immunized should receive 1 dose of PCV13 vaccine. This PCV13 should be followed with a dose of pneumococcal polysaccharide (PPSV23) vaccine. Adults who are at high risk for pneumococcal disease should obtain the PPSV23 vaccine at least 8 weeks after the dose of PCV13 vaccine. Adults older than 31 years of age who have normal immune system function should obtain the PPSV23 vaccine dose at least 1 year after the dose of PCV13 vaccine.  Pneumococcal polysaccharide (PPSV23) vaccine. When PCV13 is also indicated, PCV13 should be obtained first. All adults aged 65 years and older should be immunized. An adult younger than age 65 years who has certain medical conditions should be immunized. Any person who resides in a nursing home or long-term care facility should be immunized. An adult smoker should be immunized. People with an immunocompromised condition and certain other conditions should receive both PCV13 and PPSV23 vaccines. People with human immunodeficiency virus (HIV) infection should be immunized as soon as possible after diagnosis. Immunization during chemotherapy or radiation therapy should be avoided. Routine use of PPSV23 vaccine is not recommended for American Indians, [place] Natives, or people younger than 65 years unless there are medical conditions that require PPSV23 vaccine. When indicated, people who have unknown immunization and have no record of immunization should receive PPSV23 vaccine. One-time revaccination 5 years after the first dose of PPSV23 is recommended for people aged 19-64 years who have chronic kidney failure, nephrotic syndrome, asplenia, or immunocompromised conditions. People who received 1-2 doses of PPSV23 before age 65 years should receive another dose of PPSV23 vaccine at age 65 years or later if at least 5 years have passed since the previous dose. Doses of PPSV23 are not  needed for people immunized with PPSV23 at or after age 65 years.  Meningococcal vaccine. Adults with asplenia or persistent complement component deficiencies should receive 2 doses of quadrivalent meningococcal conjugate (MenACWY-D) vaccine. The doses should be obtained   at least 2 months apart. Microbiologists working with certain meningococcal bacteria, [employer], people at risk during an outbreak, and people who travel to or live in countries with a high rate of meningitis should be immunized. A first-year college student up through age 74 years who is living in a residence hall should receive a dose if she did not receive a dose on or after her 16th birthday. Adults who have certain high-risk conditions should receive one or more doses of vaccine.  Hepatitis A vaccine. Adults who wish to be protected from this disease, have certain high-risk conditions, work with hepatitis A-infected animals, work in hepatitis A research labs, or travel to or work in countries with a high rate of hepatitis A should be immunized. Adults who were previously unvaccinated and who anticipate close contact with an international adoptee during the first 60 days after arrival in the [place] from a country with a high rate of hepatitis A should be immunized.  Hepatitis B vaccine. Adults who wish to be protected from this disease, have certain high-risk conditions, may be exposed to blood or other infectious body fluids, are household contacts or sex partners of hepatitis B positive people, are clients or workers in certain care facilities, or travel to or work in countries with a high rate of hepatitis B should be immunized.  Haemophilus influenzae type b (Hib) vaccine. A previously unvaccinated person with asplenia or sickle cell disease or having a scheduled splenectomy should receive 1 dose of Hib vaccine. Regardless of previous immunization, a recipient of a hematopoietic stem cell transplant should receive a  3-dose series 6-12 months after her successful transplant. Hib vaccine is not recommended for adults with HIV infection. Preventive Services / Frequency Ages 16 to 53 years  Blood pressure check.** / Every 3-5 years.  Lipid and cholesterol check.** / Every 5 years beginning at age 69.  Clinical breast exam.** / Every 3 years for women in their 14s and 39s.  BRCA-related cancer risk assessment.** / For women who have family members with a BRCA-related cancer (breast, ovarian, tubal, or peritoneal cancers).  Pap test.** / Every 2 years from ages 75 through 38. Every 3 years starting at age 43 through age 4 or 75 with a history of 3 consecutive normal Pap tests.  HPV screening.** / Every 3 years from ages 25 through ages 60 to 68 with a history of 3 consecutive normal Pap tests.  Hepatitis C blood test.** / For any individual with known risks for hepatitis C.  Skin self-exam. / Monthly.  Influenza vaccine. / Every year.  Tetanus, diphtheria, and acellular pertussis (Tdap, Td) vaccine.** / Consult your health care provider. Pregnant women should receive 1 dose of Tdap vaccine during each pregnancy. 1 dose of Td every 10 years.  Varicella vaccine.** / Consult your health care provider. Pregnant females who do not have evidence of immunity should receive the first dose after pregnancy.  HPV vaccine. / 3 doses over 6 months, if 46 and younger. The vaccine is not recommended for use in pregnant females. However, pregnancy testing is not needed before receiving a dose.  Measles, mumps, rubella (MMR) vaccine.** / You need at least 1 dose of MMR if you were born in 1957 or later. You may also need a 2nd dose. For females of childbearing age, rubella immunity should be determined. If there is no evidence of immunity, females who are not pregnant should be vaccinated. If there is no evidence of immunity, females who are  pregnant should delay immunization until after pregnancy.  Pneumococcal  13-valent conjugate (PCV13) vaccine.** / Consult your health care provider.  Pneumococcal polysaccharide (PPSV23) vaccine.** / 1 to 2 doses if you smoke cigarettes or if you have certain conditions.  Meningococcal vaccine.** / 1 dose if you are age 68 to 8 years and a Market researcher living in a residence hall, or have one of several medical conditions, you need to get vaccinated against meningococcal disease. You may also need additional booster doses.  Hepatitis A vaccine.** / Consult your health care provider.  Hepatitis B vaccine.** / Consult your health care provider.  Haemophilus influenzae type b (Hib) vaccine.** / Consult your health care provider. Ages 7 to 53 years  Blood pressure check.** / Every year.  Lipid and cholesterol check.** / Every 5 years beginning at age 25 years.  Lung cancer screening. / Every year if you are aged 11-80 years and have a 30-pack-year history of smoking and currently smoke or have quit within the past 15 years. Yearly screening is stopped once you have quit smoking for at least 15 years or develop a health problem that would prevent you from having lung cancer treatment.  Clinical breast exam.** / Every year after age 48 years.  BRCA-related cancer risk assessment.** / For women who have family members with a BRCA-related cancer (breast, ovarian, tubal, or peritoneal cancers).  Mammogram.** / Every year beginning at age 41 years and continuing for as long as you are in good health. Consult with your health care provider.  Pap test.** / Every 3 years starting at age 65 years through age 37 or 70 years with a history of 3 consecutive normal Pap tests.  HPV screening.** / Every 3 years from ages 72 years through ages 60 to 40 years with a history of 3 consecutive normal Pap tests.  Fecal occult blood test (FOBT) of stool. / Every year beginning at age 21 years and continuing until age 5 years. You may not need to do this test if you get  a colonoscopy every 10 years.  Flexible sigmoidoscopy or colonoscopy.** / Every 5 years for a flexible sigmoidoscopy or every 10 years for a colonoscopy beginning at age 35 years and continuing until age 48 years.  Hepatitis C blood test.** / For all people born from [birth year] through 1966 and any individual with known risks for hepatitis C.  Skin self-exam. / Monthly.  Influenza vaccine. / Every year.  Tetanus, diphtheria, and acellular pertussis (Tdap/Td) vaccine.** / Consult your health care provider. Pregnant women should receive 1 dose of Tdap vaccine during each pregnancy. 1 dose of Td every 10 years.  Varicella vaccine.** / Consult your health care provider. Pregnant females who do not have evidence of immunity should receive the first dose after pregnancy.  Zoster vaccine.** / 1 dose for adults aged 30 years or older.  Measles, mumps, rubella (MMR) vaccine.** / You need at least 1 dose of MMR if you were born in 1957 or later. You may also need a second dose. For females of childbearing age, rubella immunity should be determined. If there is no evidence of immunity, females who are not pregnant should be vaccinated. If there is no evidence of immunity, females who are pregnant should delay immunization until after pregnancy.  Pneumococcal 13-valent conjugate (PCV13) vaccine.** / Consult your health care provider.  Pneumococcal polysaccharide (PPSV23) vaccine.** / 1 to 2 doses if you smoke cigarettes or if you have certain conditions.  Meningococcal vaccine.** /  Consult your health care provider.  Hepatitis A vaccine.** / Consult your health care provider.  Hepatitis B vaccine.** / Consult your health care provider.  Haemophilus influenzae type b (Hib) vaccine.** / Consult your health care provider. Ages 43 years and over  Blood pressure check.** / Every year.  Lipid and cholesterol check.** / Every 5 years beginning at age 24 years.  Lung cancer screening. / Every year if you  are aged 51-80 years and have a 30-pack-year history of smoking and currently smoke or have quit within the past 15 years. Yearly screening is stopped once you have quit smoking for at least 15 years or develop a health problem that would prevent you from having lung cancer treatment.  Clinical breast exam.** / Every year after age 62 years.  BRCA-related cancer risk assessment.** / For women who have family members with a BRCA-related cancer (breast, ovarian, tubal, or peritoneal cancers).  Mammogram.** / Every year beginning at age 53 years and continuing for as long as you are in good health. Consult with your health care provider.  Pap test.** / Every 3 years starting at age 40 years through age 54 or 47 years with 3 consecutive normal Pap tests. Testing can be stopped between 65 and 70 years with 3 consecutive normal Pap tests and no abnormal Pap or HPV tests in the past 10 years.  HPV screening.** / Every 3 years from ages 39 years through ages 51 or 85 years with a history of 3 consecutive normal Pap tests. Testing can be stopped between 65 and 70 years with 3 consecutive normal Pap tests and no abnormal Pap or HPV tests in the past 10 years.  Fecal occult blood test (FOBT) of stool. / Every year beginning at age 39 years and continuing until age 2 years. You may not need to do this test if you get a colonoscopy every 10 years.  Flexible sigmoidoscopy or colonoscopy.** / Every 5 years for a flexible sigmoidoscopy or every 10 years for a colonoscopy beginning at age 63 years and continuing until age 37 years.  Hepatitis C blood test.** / For all people born from [birth year] through 1966 and any individual with known risks for hepatitis C.  Osteoporosis screening.** / A one-time screening for women ages 94 years and over and women at risk for fractures or osteoporosis.  Skin self-exam. / Monthly.  Influenza vaccine. / Every year.  Tetanus, diphtheria, and acellular pertussis (Tdap/Td)  vaccine.** / 1 dose of Td every 10 years.  Varicella vaccine.** / Consult your health care provider.  Zoster vaccine.** / 1 dose for adults aged 3 years or older.  Pneumococcal 13-valent conjugate (PCV13) vaccine.** / Consult your health care provider.  Pneumococcal polysaccharide (PPSV23) vaccine.** / 1 dose for all adults aged 72 years and older.  Meningococcal vaccine.** / Consult your health care provider.  Hepatitis A vaccine.** / Consult your health care provider.  Hepatitis B vaccine.** / Consult your health care provider.  Haemophilus influenzae type b (Hib) vaccine.** / Consult your health care provider. ** Family history and personal history of risk and conditions may change your health care provider's recommendations.   This information is not intended to replace advice given to you by your health care provider. Make sure you discuss any questions you have with your health care provider.   Document Released: 11/19/2001 Document Revised: 10/14/2014 Document Reviewed: 02/18/2011 Elsevier Interactive Patient Education [employer].

## 2016-05-27 NOTE — Progress Notes (Signed)
   Subjective:     Gloria Dean is a 30 y.o. female and is here for a comprehensive physical exam. The patient reports no problems. Has no cycle with Liletta. Kids are 2 and 4. Needs yearly labs.  Social History   Social History  . Marital status: Married    Spouse name: N/A  . Number of children: N/A  . Years of education: N/A   Occupational History  . Not on file.   Social History Main Topics  . Smoking status: Former Smoker    Types: Cigarettes    Quit date: 07/09/2011  . Smokeless tobacco: Never Used     Comment: socially  . Alcohol use Yes     Comment: social, not while pregnancy  . Drug use: No  . Sexual activity: Yes    Partners: Male    Birth control/ protection: IUD   Other Topics Concern  . Not on file   Social History Narrative  . No narrative on file   Health Maintenance  Topic Date Due  . INFLUENZA VACCINE  05/07/2016  . PAP SMEAR  02/21/2018  . TETANUS/TDAP  09/08/2023  . HIV Screening  Completed    The following portions of the patient's history were reviewed and updated as appropriate: allergies, current medications, past family history, past medical history, past social history, past surgical history and problem list.  Review of Systems Pertinent items noted in HPI and remainder of comprehensive ROS otherwise negative.   Objective:    BP 105/70 (BP Location: Left Arm, Patient Position: Sitting, Cuff Size: Normal)   Pulse 73   Resp 18   Ht 5\' 3"  (1.6 m)   Wt 128 lb (58.1 kg)   BMI 22.67 kg/m  General appearance: alert, cooperative and appears stated age Head: Normocephalic, without obvious abnormality, atraumatic Neck: no adenopathy, supple, symmetrical, trachea midline and thyroid not enlarged, symmetric, no tenderness/mass/nodules Lungs: clear to auscultation bilaterally Breasts: normal appearance, no masses or tenderness Heart: regular rate and rhythm, S1, S2 normal, no murmur, click, rub or gallop Abdomen: soft, non-tender; bowel  sounds normal; no masses,  no organomegaly Pelvic: cervix normal in appearance, external genitalia normal, no adnexal masses or tenderness, no cervical motion tenderness, uterus normal size, shape, and consistency and vagina normal without discharge Extremities: extremities normal, atraumatic, no cyanosis or edema Pulses: 2+ and symmetric Skin: Skin color, texture, turgor normal. No rashes or lesions Lymph nodes: Cervical, supraclavicular, and axillary nodes normal. Neurologic: Grossly normal    Assessment:    Healthy female exam.      Plan:      Problem List Items Addressed This Visit    None    Visit Diagnoses    Screening for malignant neoplasm of cervix    -  Primary   Relevant Orders   CBC   Comprehensive metabolic panel   TSH   Lipid panel   Encounter for routine gynecological examination       Relevant Orders   Pap Lb, rfx HPV all pth       See After Visit Summary for Counseling Recommendations

## 2016-05-28 ENCOUNTER — Encounter: Payer: Self-pay | Admitting: *Deleted

## 2016-05-29 LAB — COMPREHENSIVE METABOLIC PANEL
ALT: 29 IU/L (ref 0–32)
AST: 18 IU/L (ref 0–40)
Albumin/Globulin Ratio: 2 (ref 1.2–2.2)
Albumin: 4.5 g/dL (ref 3.5–5.5)
Alkaline Phosphatase: 41 IU/L (ref 39–117)
BUN/Creatinine Ratio: 13 (ref 9–23)
BUN: 9 mg/dL (ref 6–20)
Bilirubin Total: 0.4 mg/dL (ref 0.0–1.2)
CO2: 25 mmol/L (ref 18–29)
Calcium: 8.8 mg/dL (ref 8.7–10.2)
Chloride: 102 mmol/L (ref 96–106)
Creatinine, Ser: 0.71 mg/dL (ref 0.57–1.00)
GFR calc Af Amer: 132 mL/min/{1.73_m2} (ref >59)
GFR calc non Af Amer: 115 mL/min/{1.73_m2} (ref >59)
Globulin, Total: 2.3 g/dL (ref 1.5–4.5)
Glucose: 93 mg/dL (ref 65–99)
Potassium: 4.1 mmol/L (ref 3.5–5.2)
Sodium: 141 mmol/L (ref 134–144)
Total Protein: 6.8 g/dL (ref 6.0–8.5)

## 2016-05-29 LAB — LIPID PANEL
Chol/HDL Ratio: 2.5 ratio units (ref 0.0–4.4)
Cholesterol, Total: 162 mg/dL (ref 100–199)
HDL: 66 mg/dL (ref >39)
LDL Calculated: 86 mg/dL (ref 0–99)
Triglycerides: 52 mg/dL (ref 0–149)
VLDL Cholesterol Cal: 10 mg/dL (ref 5–40)

## 2016-05-29 LAB — CBC
Hematocrit: 41 % (ref 34.0–46.6)
Hemoglobin: 13.3 g/dL (ref 11.1–15.9)
MCH: 30.2 pg (ref 26.6–33.0)
MCHC: 32.4 g/dL (ref 31.5–35.7)
MCV: 93 fL (ref 79–97)
Platelets: 276 10*3/uL (ref 150–379)
RBC: 4.41 x10E6/uL (ref 3.77–5.28)
RDW: 13.3 % (ref 12.3–15.4)
WBC: 6.4 10*3/uL (ref 3.4–10.8)

## 2016-05-29 LAB — TSH: TSH: 1.72 u[IU]/mL (ref 0.450–4.500)

## 2016-05-31 ENCOUNTER — Telehealth: Payer: Self-pay | Admitting: *Deleted

## 2016-05-31 LAB — HPV DNA PROBE HIGH RISK, AMPLIFIED: HPV, high-risk: POSITIVE — AB

## 2016-05-31 LAB — PAP LB, RFX HPV ALL PTH: PAP Smear Comment: 0

## 2016-05-31 NOTE — Telephone Encounter (Signed)
Informed pt of normal lab results, pap still pending at this time.  Will call back when results are available.

## 2016-05-31 NOTE — Telephone Encounter (Signed)
-----   Message from Reva Boresanya S Pratt, MD sent at 05/29/2016  7:59 AM EDT ----- Her labs are normal

## 2016-06-03 ENCOUNTER — Encounter: Payer: Self-pay | Admitting: Family Medicine

## 2016-06-04 ENCOUNTER — Telehealth: Payer: Self-pay | Admitting: *Deleted

## 2016-06-04 NOTE — Telephone Encounter (Signed)
-----   Message from Reva Boresanya S Pratt, MD sent at 06/03/2016  4:05 PM EDT ----- Needs colpo

## 2016-06-25 ENCOUNTER — Ambulatory Visit (INDEPENDENT_AMBULATORY_CARE_PROVIDER_SITE_OTHER): Payer: 59 | Admitting: Family Medicine

## 2016-06-25 ENCOUNTER — Encounter: Payer: Self-pay | Admitting: *Deleted

## 2016-06-25 ENCOUNTER — Other Ambulatory Visit (HOSPITAL_COMMUNITY)
Admission: RE | Admit: 2016-06-25 | Discharge: 2016-06-25 | Disposition: A | Discharge status: Home / Self Care | Payer: 59 | Source: Ambulatory Visit | Attending: Family Medicine | Admitting: Family Medicine

## 2016-06-25 ENCOUNTER — Encounter: Payer: Self-pay | Admitting: Family Medicine

## 2016-06-25 VITALS — BP 112/76 | HR 65 | Ht 63.0 in | Wt 128.0 lb

## 2016-06-25 DIAGNOSIS — IMO0002 Reserved for concepts with insufficient information to code with codable children: Secondary | ICD-10-CM

## 2016-06-25 DIAGNOSIS — R87612 Low grade squamous intraepithelial lesion on cytologic smear of cervix (LGSIL): Secondary | ICD-10-CM

## 2016-06-25 DIAGNOSIS — R896 Abnormal cytological findings in specimens from other organs, systems and tissues: Secondary | ICD-10-CM | POA: Insufficient documentation

## 2016-06-25 NOTE — Patient Instructions (Signed)

## 2016-06-25 NOTE — Assessment & Plan Note (Signed)
S/p colpo--proceed with treatment based on results.

## 2016-06-25 NOTE — Progress Notes (Signed)
   Subjective:    Patient ID: Gloria Dean is a 30 y.o. female presenting with Colposcopy  on 06/25/2016  HPI: Patient here for f/u abnormal pap. Has h/o LGSIL s/p colpo, f/u pap with normal cytology and HPV positive, now pap is LGSIL again.  Review of Systems  Constitutional: Negative for chills and fever.  Respiratory: Negative for shortness of breath.   Cardiovascular: Negative for chest pain.  Gastrointestinal: Negative for abdominal pain, nausea and vomiting.  Genitourinary: Negative for dysuria.  Skin: Negative for rash.      Objective:    BP 112/76   Pulse 65   Ht 5\' 3"  (1.6 m)   Wt 128 lb (58.1 kg)   Breastfeeding? No   BMI 22.67 kg/m  Physical Exam  Constitutional: She is oriented to person, place, and time. She appears well-developed and well-nourished. No distress.  HENT:  Head: Normocephalic and atraumatic.  Eyes: No scleral icterus.  Neck: Neck supple.  Cardiovascular: Normal rate.   Pulmonary/Chest: Effort normal.  Abdominal: Soft.  Neurological: She is alert and oriented to person, place, and time.  Skin: Skin is warm and dry.  Psychiatric: She has a normal mood and affect.    Procedure: Patient given informed consent, signed copy in the chart, time out was performed.  Placed in lithotomy position. Cervix viewed with speculum and colposcope after application of acetic acid.   Colposcopy adequate?  yes Acetowhite lesions? yes Punctation? yes Mosaicism?  no Abnormal vasculature?  no Biopsies? 12, 3, 9 o'clock ECC? yes  COMMENTS:  Patient was given post procedure instructions.       Assessment & Plan:   Problem List Items Addressed This Visit      Unprioritized   LGSIL (low grade squamous intraepithelial dysplasia) - Primary    S/p colpo--proceed with treatment based on results.      Relevant Orders   Surgical pathology    Other Visit Diagnoses   None.    Return as needed.   Reva Boresanya S Pratt 06/25/2016 1:55 PM

## 2016-06-28 ENCOUNTER — Telehealth: Payer: Self-pay | Admitting: *Deleted

## 2016-06-28 NOTE — Telephone Encounter (Signed)
Called pt, no answer, left message about result and to repeat pap smear in one year. If she has any additional questions to please call the office.

## 2016-06-28 NOTE — Telephone Encounter (Signed)
-----   Message from Reva Boresanya S Pratt, MD sent at 06/27/2016  5:52 PM EDT ----- colpo is consistent with pap--f/u pap in 1 year.

## 2017-06-03 ENCOUNTER — Ambulatory Visit (INDEPENDENT_AMBULATORY_CARE_PROVIDER_SITE_OTHER): Payer: 59 | Admitting: Family Medicine

## 2017-06-03 ENCOUNTER — Encounter: Payer: Self-pay | Admitting: Family Medicine

## 2017-06-03 VITALS — BP 116/75 | HR 76 | Ht 63.0 in | Wt 135.0 lb

## 2017-06-03 DIAGNOSIS — Z124 Encounter for screening for malignant neoplasm of cervix: Secondary | ICD-10-CM

## 2017-06-03 DIAGNOSIS — Z01419 Encounter for gynecological examination (general) (routine) without abnormal findings: Secondary | ICD-10-CM

## 2017-06-03 DIAGNOSIS — Z1151 Encounter for screening for human papillomavirus (HPV): Secondary | ICD-10-CM

## 2017-06-03 DIAGNOSIS — R87612 Low grade squamous intraepithelial lesion on cytologic smear of cervix (LGSIL): Secondary | ICD-10-CM

## 2017-06-03 NOTE — Progress Notes (Signed)
05/27/16 - Last pap - abnormal - needs pap today

## 2017-06-03 NOTE — Progress Notes (Signed)
  Subjective:     Gloria Dean is a 31 y.o. female and is here for a comprehensive physical exam. The patient reports no problems. H/O LGSIL last year with colpo showing same for repeat this year. Has IUD in since 2016. No cycles.  Social History   Social History  . Marital status: Married    Spouse name: N/A  . Number of children: N/A  . Years of education: N/A   Occupational History  . Not on file.   Social History Main Topics  . Smoking status: Former Smoker    Types: Cigarettes    Quit date: 07/09/2011  . Smokeless tobacco: Never Used     Comment: socially  . Alcohol use Yes     Comment: social, not while pregnancy  . Drug use: No  . Sexual activity: Yes    Partners: Male    Birth control/ protection: IUD   Other Topics Concern  . Not on file   Social History Narrative  . No narrative on file   Health Maintenance  Topic Date Due  . INFLUENZA VACCINE  05/07/2017  . PAP SMEAR  05/28/2019  . TETANUS/TDAP  09/08/2023  . HIV Screening  Completed    The following portions of the patient's history were reviewed and updated as appropriate: allergies, current medications, past family history, past medical history, past social history, past surgical history and problem list.  Review of Systems Pertinent items noted in HPI and remainder of comprehensive ROS otherwise negative.   Objective:    BP 116/75   Pulse 76   Ht 5\' 3"  (1.6 m)   Wt 135 lb (61.2 kg)   BMI 23.91 kg/m  General appearance: alert, cooperative and appears stated age Head: Normocephalic, without obvious abnormality, atraumatic Neck: no adenopathy, supple, symmetrical, trachea midline and thyroid not enlarged, symmetric, no tenderness/mass/nodules Lungs: clear to auscultation bilaterally Breasts: normal appearance, no masses or tenderness Heart: regular rate and rhythm, S1, S2 normal, no murmur, click, rub or gallop Abdomen: soft, non-tender; bowel sounds normal; no masses,  no  organomegaly Pelvic: cervix normal in appearance, external genitalia normal, no adnexal masses or tenderness, no cervical motion tenderness, uterus normal size, shape, and consistency, vagina normal without discharge and IUD strings noted Extremities: extremities normal, atraumatic, no cyanosis or edema Pulses: 2+ and symmetric Skin: Skin color, texture, turgor normal. No rashes or lesions Lymph nodes: Cervical, supraclavicular, and axillary nodes normal. Neurologic: Grossly normal    Assessment:    Healthy female exam.      Plan:   Problem List Items Addressed This Visit      Unprioritized   LGSIL on Pap smear of cervix - Primary   Relevant Orders   Cytology - PAP    Other Visit Diagnoses    Screening for malignant neoplasm of cervix       Relevant Orders   Cytology - PAP   Encounter for gynecological examination without abnormal finding       Relevant Orders   Lipid panel   CBC   CMP and Liver   TSH        See After Visit Summary for Counseling Recommendations

## 2017-06-03 NOTE — Patient Instructions (Signed)
Preventive Care 18-39 Years, Female Preventive care refers to lifestyle choices and visits with your health care provider that can promote health and wellness. What does preventive care include?  A yearly physical exam. This is also called an annual well check.  Dental exams once or twice a year.  Routine eye exams. Ask your health care provider how often you should have your eyes checked.  Personal lifestyle choices, including: ? Daily care of your teeth and gums. ? Regular physical activity. ? Eating a healthy diet. ? Avoiding tobacco and drug use. ? Limiting alcohol use. ? Practicing safe sex. ? Taking vitamin and mineral supplements as recommended by your health care provider. What happens during an annual well check? The services and screenings done by your health care provider during your annual well check will depend on your age, overall health, lifestyle risk factors, and family history of disease. Counseling Your health care provider may ask you questions about your:  Alcohol use.  Tobacco use.  Drug use.  Emotional well-being.  Home and relationship well-being.  Sexual activity.  Eating habits.  Work and work Statistician.  Method of birth control.  Menstrual cycle.  Pregnancy history.  Screening You may have the following tests or measurements:  Height, weight, and BMI.  Diabetes screening. This is done by checking your blood sugar (glucose) after you have not eaten for a while (fasting).  Blood pressure.  Lipid and cholesterol levels. These may be checked every 5 years starting at age 38.  Skin check.  Hepatitis C blood test.  Hepatitis B blood test.  Sexually transmitted disease (STD) testing.  BRCA-related cancer screening. This may be done if you have a family history of breast, ovarian, tubal, or peritoneal cancers.  Pelvic exam and Pap test. This may be done every 3 years starting at age 38. Starting at age 30, this may be done  every 5 years if you have a Pap test in combination with an HPV test.  Discuss your test results, treatment options, and if necessary, the need for more tests with your health care provider. Vaccines Your health care provider may recommend certain vaccines, such as:  Influenza vaccine. This is recommended every year.  Tetanus, diphtheria, and acellular pertussis (Tdap, Td) vaccine. You may need a Td booster every 10 years.  Varicella vaccine. You may need this if you have not been vaccinated.  HPV vaccine. If you are 39 or younger, you may need three doses over 6 months.  Measles, mumps, and rubella (MMR) vaccine. You may need at least one dose of MMR. You may also need a second dose.  Pneumococcal 13-valent conjugate (PCV13) vaccine. You may need this if you have certain conditions and were not previously vaccinated.  Pneumococcal polysaccharide (PPSV23) vaccine. You may need one or two doses if you smoke cigarettes or if you have certain conditions.  Meningococcal vaccine. One dose is recommended if you are age 68-21 years and a first-year college student living in a residence hall, or if you have one of several medical conditions. You may also need additional booster doses.  Hepatitis A vaccine. You may need this if you have certain conditions or if you travel or work in places where you may be exposed to hepatitis A.  Hepatitis B vaccine. You may need this if you have certain conditions or if you travel or work in places where you may be exposed to hepatitis B.  Haemophilus influenzae type b (Hib) vaccine. You may need this  if you have certain risk factors.  Talk to your health care provider about which screenings and vaccines you need and how often you need them. This information is not intended to replace advice given to you by your health care provider. Make sure you discuss any questions you have with your health care provider. Document Released: 11/19/2001 Document Revised:  06/12/2016 Document Reviewed: 07/25/2015 Elsevier Interactive Patient Education  2017 [employer]  

## 2017-06-04 LAB — CMP AND LIVER
ALT: 15 IU/L (ref 0–32)
AST: 19 IU/L (ref 0–40)
Albumin: 5 g/dL (ref 3.5–5.5)
Alkaline Phosphatase: 51 IU/L (ref 39–117)
BUN: 8 mg/dL (ref 6–20)
Bilirubin Total: 0.3 mg/dL (ref 0.0–1.2)
Bilirubin, Direct: 0.11 mg/dL (ref 0.00–0.40)
CO2: 24 mmol/L (ref 20–29)
Calcium: 9.4 mg/dL (ref 8.7–10.2)
Chloride: 101 mmol/L (ref 96–106)
Creatinine, Ser: 0.75 mg/dL (ref 0.57–1.00)
GFR calc Af Amer: 123 mL/min/{1.73_m2} (ref >59)
GFR calc non Af Amer: 107 mL/min/{1.73_m2} (ref >59)
Glucose: 74 mg/dL (ref 65–99)
Potassium: 4.2 mmol/L (ref 3.5–5.2)
Sodium: 140 mmol/L (ref 134–144)
Total Protein: 7.4 g/dL (ref 6.0–8.5)

## 2017-06-04 LAB — TSH: TSH: 1.86 u[IU]/mL (ref 0.450–4.500)

## 2017-06-04 LAB — CBC
Hematocrit: 39.3 % (ref 34.0–46.6)
Hemoglobin: 13.7 g/dL (ref 11.1–15.9)
MCH: 29.6 pg (ref 26.6–33.0)
MCHC: 34.9 g/dL (ref 31.5–35.7)
MCV: 85 fL (ref 79–97)
Platelets: 271 10*3/uL (ref 150–379)
RBC: 4.63 x10E6/uL (ref 3.77–5.28)
RDW: 14.3 % (ref 12.3–15.4)
WBC: 6.7 10*3/uL (ref 3.4–10.8)

## 2017-06-04 LAB — LIPID PANEL
Chol/HDL Ratio: 2.8 ratio (ref 0.0–4.4)
Cholesterol, Total: 190 mg/dL (ref 100–199)
HDL: 69 mg/dL (ref >39)
LDL Calculated: 111 mg/dL — ABNORMAL HIGH (ref 0–99)
Triglycerides: 51 mg/dL (ref 0–149)
VLDL Cholesterol Cal: 10 mg/dL (ref 5–40)

## 2017-06-05 LAB — CYTOLOGY - PAP
Diagnosis: NEGATIVE
HPV: NOT DETECTED

## 2018-03-19 ENCOUNTER — Encounter: Payer: Self-pay | Admitting: Radiology

## 2018-06-23 ENCOUNTER — Ambulatory Visit (INDEPENDENT_AMBULATORY_CARE_PROVIDER_SITE_OTHER): Payer: 59 | Admitting: Family Medicine

## 2018-06-23 ENCOUNTER — Other Ambulatory Visit (HOSPITAL_COMMUNITY)
Admission: RE | Admit: 2018-06-23 | Discharge: 2018-06-23 | Disposition: A | Discharge status: Home / Self Care | Payer: 59 | Source: Ambulatory Visit | Attending: Family Medicine | Admitting: Family Medicine

## 2018-06-23 ENCOUNTER — Encounter: Payer: Self-pay | Admitting: Family Medicine

## 2018-06-23 VITALS — BP 117/78 | HR 72 | Wt 134.0 lb

## 2018-06-23 DIAGNOSIS — Z124 Encounter for screening for malignant neoplasm of cervix: Secondary | ICD-10-CM

## 2018-06-23 DIAGNOSIS — Z30431 Encounter for routine checking of intrauterine contraceptive device: Secondary | ICD-10-CM

## 2018-06-23 DIAGNOSIS — Z01419 Encounter for gynecological examination (general) (routine) without abnormal findings: Secondary | ICD-10-CM

## 2018-06-23 NOTE — Progress Notes (Signed)
  Subjective:     Gloria Dean is a 32 y.o. female and is here for a comprehensive physical exam. The patient reports no problemsFew cycles with Liletta. In place since 2016. Normal blood work last year. Changed jobs and is working for the [employer].   The following portions of the patient's history were reviewed and updated as appropriate: allergies, current medications, past family history, past medical history, past social history, past surgical history and problem list.  Review of Systems Pertinent items noted in HPI and remainder of comprehensive ROS otherwise negative.   Objective:    BP 117/78   Pulse 72   Wt 134 lb (60.8 kg)   BMI 23.74 kg/m  General appearance: alert, cooperative and appears stated age Head: Normocephalic, without obvious abnormality, atraumatic Neck: no adenopathy, supple, symmetrical, trachea midline and thyroid not enlarged, symmetric, no tenderness/mass/nodules Lungs: clear to auscultation bilaterally Breasts: normal appearance, no masses or tenderness Heart: regular rate and rhythm Abdomen: soft, non-tender; bowel sounds normal; no masses,  no organomegaly Pelvic: cervix normal in appearance, external genitalia normal, no adnexal masses or tenderness, no cervical motion tenderness, uterus normal size, shape, and consistency, vagina normal without discharge and IUD strings visualized Extremities: extremities normal, atraumatic, no cyanosis or edema Pulses: 2+ and symmetric Skin: Skin color, texture, turgor normal. No rashes or lesions Lymph nodes: Cervical, supraclavicular, and axillary nodes normal. Neurologic: Grossly normal    Assessment:    Healthy female exam.      Plan:      Problem List Items Addressed This Visit    None    Visit Diagnoses    Screening for cervical cancer    -  Primary   Relevant Orders   Cytology - PAP   Encounter for gynecological examination without abnormal finding       IUD check up         Return in  1 year (on 06/24/2019).   See After Visit Summary for Counseling Recommendations

## 2018-06-23 NOTE — Progress Notes (Signed)
LAST PAP 06/03/2017-NORMAL

## 2018-06-23 NOTE — Patient Instructions (Signed)
Preventive Care 18-39 Years, Female Preventive care refers to lifestyle choices and visits with your health care provider that can promote health and wellness. What does preventive care include?  A yearly physical exam. This is also called an annual well check.  Dental exams once or twice a year.  Routine eye exams. Ask your health care provider how often you should have your eyes checked.  Personal lifestyle choices, including: ? Daily care of your teeth and gums. ? Regular physical activity. ? Eating a healthy diet. ? Avoiding tobacco and drug use. ? Limiting alcohol use. ? Practicing safe sex. ? Taking vitamin and mineral supplements as recommended by your health care provider. What happens during an annual well check? The services and screenings done by your health care provider during your annual well check will depend on your age, overall health, lifestyle risk factors, and family history of disease. Counseling Your health care provider may ask you questions about your:  Alcohol use.  Tobacco use.  Drug use.  Emotional well-being.  Home and relationship well-being.  Sexual activity.  Eating habits.  Work and work Statistician.  Method of birth control.  Menstrual cycle.  Pregnancy history.  Screening You may have the following tests or measurements:  Height, weight, and BMI.  Diabetes screening. This is done by checking your blood sugar (glucose) after you have not eaten for a while (fasting).  Blood pressure.  Lipid and cholesterol levels. These may be checked every 5 years starting at age 38.  Skin check.  Hepatitis C blood test.  Hepatitis B blood test.  Sexually transmitted disease (STD) testing.  BRCA-related cancer screening. This may be done if you have a family history of breast, ovarian, tubal, or peritoneal cancers.  Pelvic exam and Pap test. This may be done every 3 years starting at age 38. Starting at age 30, this may be done  every 5 years if you have a Pap test in combination with an HPV test.  Discuss your test results, treatment options, and if necessary, the need for more tests with your health care provider. Vaccines Your health care provider may recommend certain vaccines, such as:  Influenza vaccine. This is recommended every year.  Tetanus, diphtheria, and acellular pertussis (Tdap, Td) vaccine. You may need a Td booster every 10 years.  Varicella vaccine. You may need this if you have not been vaccinated.  HPV vaccine. If you are 39 or younger, you may need three doses over 6 months.  Measles, mumps, and rubella (MMR) vaccine. You may need at least one dose of MMR. You may also need a second dose.  Pneumococcal 13-valent conjugate (PCV13) vaccine. You may need this if you have certain conditions and were not previously vaccinated.  Pneumococcal polysaccharide (PPSV23) vaccine. You may need one or two doses if you smoke cigarettes or if you have certain conditions.  Meningococcal vaccine. One dose is recommended if you are age 68-21 years and a first-year college student living in a residence hall, or if you have one of several medical conditions. You may also need additional booster doses.  Hepatitis A vaccine. You may need this if you have certain conditions or if you travel or work in places where you may be exposed to hepatitis A.  Hepatitis B vaccine. You may need this if you have certain conditions or if you travel or work in places where you may be exposed to hepatitis B.  Haemophilus influenzae type b (Hib) vaccine. You may need this  if you have certain risk factors.  Talk to your health care provider about which screenings and vaccines you need and how often you need them. This information is not intended to replace advice given to you by your health care provider. Make sure you discuss any questions you have with your health care provider. Document Released: 11/19/2001 Document Revised:  06/12/2016 Document Reviewed: 07/25/2015 Elsevier Interactive Patient Education  2018 [employer]  

## 2018-06-26 LAB — CYTOLOGY - PAP
HPV 16/18/45 genotyping: NEGATIVE
HPV: DETECTED — AB

## 2018-08-18 ENCOUNTER — Encounter: Payer: Self-pay | Admitting: Family Medicine

## 2018-08-18 ENCOUNTER — Ambulatory Visit (INDEPENDENT_AMBULATORY_CARE_PROVIDER_SITE_OTHER): Payer: 59 | Admitting: Family Medicine

## 2018-08-18 VITALS — BP 105/58 | HR 72 | Wt 134.2 lb

## 2018-08-18 DIAGNOSIS — R87612 Low grade squamous intraepithelial lesion on cytologic smear of cervix (LGSIL): Secondary | ICD-10-CM | POA: Diagnosis not present

## 2018-08-18 NOTE — Patient Instructions (Addendum)
Colposcopy, Care After  This sheet gives you information about how to care for yourself after your procedure. Your doctor may also give you more specific instructions. If you have problems or questions, contact your doctor.  What can I expect after the procedure?  If you did not have a tissue sample removed (did not have a biopsy), you may only have some spotting for a few days. You can go back to your normal activities.  If you had a tissue sample removed, it is common to have:  · Soreness and pain. This may last for a few days.  · Light-headedness.  · Mild bleeding from your vagina or dark-colored, grainy discharge from your vagina. This may last for a few days. You may need to wear a sanitary pad.  · Spotting for at least 48 hours after the procedure.    Follow these instructions at home:  · Take over-the-counter and prescription medicines only as told by your doctor. Ask your doctor what medicines you can start taking again. This is very important if you take blood-thinning medicine.  · Do not drive or use heavy machinery while taking prescription pain medicine.  · For 3 days, or as long as your doctor tells you, avoid:  ? Douching.  ? Using tampons.  ? Having sex.  · If you use birth control (contraception), keep using it.  · Limit activity for the first day after the procedure. Ask your doctor what activities are safe for you.  · It is up to you to get the results of your procedure. Ask your doctor when your results will be ready.  · Keep all follow-up visits as told by your doctor. This is important.  Contact a doctor if:  · You get a skin rash.  Get help right away if:  · You are bleeding a lot from your vagina. It is a lot of bleeding if you are using more than one pad an hour for 2 hours in a row.  · You have clumps of blood (blood clots) coming from your vagina.  · You have a fever.  · You have chills  · You have pain in your lower belly (pelvic area).  · You have signs of infection, such as vaginal  discharge that is:  ? Different than usual.  ? Yellow.  ? Bad-smelling.  · You have very pain or cramps in your lower belly that do not get better with medicine.  · You feel light-headed.  · You feel dizzy.  · You pass out (faint).  Summary  · If you did not have a tissue sample removed (did not have a biopsy), you may only have some spotting for a few days. You can go back to your normal activities.  · If you had a tissue sample removed, it is common to have mild pain and spotting for 48 hours.  · For 3 days, or as long as your doctor tells you, avoid douching, using tampons and having sex.  · Get help right away if you have bleeding, very bad pain, or signs of infection.  This information is not intended to replace advice given to you by your health care provider. Make sure you discuss any questions you have with your health care provider.  Document Released: 03/11/2008 Document Revised: 06/12/2016 Document Reviewed: 06/12/2016  Elsevier Interactive Patient Education © 2018 Elsevier Inc.

## 2018-08-18 NOTE — Progress Notes (Signed)
    GYNECOLOGY CLINIC COLPOSCOPY PROCEDURE NOTE  32 y.o. [id number] here for colposcopy for low-grade squamous intraepithelial neoplasia (LGSIL - encompassing HPV,mild dysplasia,CIN I) pap smear on 06/23/2018. Discussed role for HPV in cervical dysplasia, need for surveillance.  Patient given informed consent, signed copy in the chart, time out was performed.  Placed in lithotomy position. Cervix viewed with speculum and colposcope after application of acetic acid.   Colposcopy adequate? Yes  acetowhite lesion(s) noted at 7 and 12 o'clock; corresponding biopsies obtained.  ECC specimen obtained. All specimens were labeled and sent to pathology.   Patient was given post procedure instructions.  Will follow up pathology and manage accordingly; patient will be contacted with results and recommendations.  Routine preventative health maintenance measures emphasized.    Reva Bores 08/18/2018 4:28 PM

## 2018-08-19 ENCOUNTER — Encounter: Payer: Self-pay | Admitting: *Deleted

## 2018-10-07 NOTE — L&D Delivery Note (Addendum)
OB/GYN Faculty Practice Delivery Note  Gloria Dean is a 33 y.o. G3P2002 s/p SVD at [redacted]w[redacted]d. She was admitted for SOL.  ROM: 0h 18m with light meconium stain fluid GBS Status: Negative Maximum Maternal Temperature: 98  Labor Progress: . Initial SVE: 5/50/-2. AROM at 1651 for meconium stain fluid and achieved completed cervical dilation at 1700. No further augmentation required and progressed to spontaneous vaginal delivery.  Delivery Date/Time: 09/16/2019 @ 1704 Delivery: Called to room and patient was complete and pushing. Head delivered LOA. Tight nuchal cord x1 present and delivered through with somersault. Shoulder and body delivered in usual fashion. Infant with spontaneous cry, placed on mother's abdomen, dried and stimulated. Cord clamped x 2 after 1-minute delay, and cut by FOB. Cord blood drawn. Placenta delivered spontaneously with gentle cord traction. Fundus firm with massage and Pitocin. Labia, perineum, vagina, and cervix inspected with right periurethral tear noted and repaired with 4.0 Vicryl. Post-placental IUD inserted; see separate note.  Placenta: 3 vessel cord, intact and sent to Pathology for evaluation of succenturiate lobe and marginal cord insertion  Complications: None Lacerations: Right periurethral tear repaired with 4.0 Vicryl to achieve hemostasis QBL: 101 mL Analgesia: None  Postpartum Planning [x]  message to sent to schedule follow-up  [x]  vaccines UTD  Infant: female  APGARs 8/9  weight per chart  Carollee Leitz MD [place] [facility] [place] 09/16/2019, 5:22 PM  OB FELLOW DELIVERY ATTESTATION  I was gloved and present for the delivery in its entirety, and I agree with the above resident's note. I personally evaluated placenta for intactness and noted succenturiate lobe to be present and placenta entirely intact. Fundal sweep also done with IUD placement without retained product present.   Barrington Ellison, MD [facility] Family Medicine Fellow, [facility] for [employer], [place]

## 2018-11-09 ENCOUNTER — Encounter: Payer: Self-pay | Admitting: *Deleted

## 2018-11-09 ENCOUNTER — Encounter: Payer: Self-pay | Admitting: Family Medicine

## 2018-11-09 ENCOUNTER — Ambulatory Visit: Payer: 59 | Admitting: Family Medicine

## 2018-11-09 VITALS — BP 115/79 | HR 73 | Ht 63.0 in | Wt 136.0 lb

## 2018-11-09 DIAGNOSIS — Z23 Encounter for immunization: Secondary | ICD-10-CM | POA: Diagnosis not present

## 2018-11-09 DIAGNOSIS — Z30432 Encounter for removal of intrauterine contraceptive device: Secondary | ICD-10-CM

## 2018-11-09 NOTE — Progress Notes (Signed)
Pt would like to get IUD removed

## 2018-11-09 NOTE — Progress Notes (Signed)
    GYNECOLOGY OFFICE PROCEDURE NOTE  Gloria Dean is a 33 y.o. G2P2002 here for Liletta IUD removal. No GYN concerns.    IUD Removal  Patient identified, informed consent performed, consent signed.  Patient was in the dorsal lithotomy position, normal external genitalia was noted.  A speculum was placed in the patient's vagina, normal discharge was noted, no lesions. The cervix was visualized, no lesions, no abnormal discharge.  The strings of the IUD were grasped and pulled using forceps. The IUD was removed in its entirety.  Patient tolerated the procedure well.    Patient plans for pregnancy soon and she was told to avoid teratogens, take PNV and folic acid.  Routine preventative health maintenance measures emphasized. Flu shot given today  Reva Bores, MD 11/09/2018 5:18 PM

## 2018-11-09 NOTE — Patient Instructions (Signed)
Planning for Pregnancy With HIV  HIV (human immunodeficiency virus) is a permanent, or chronic, viral infection. HIV kills white blood cells called CD4 cells. These cells help to control your body's defense system (immune system) and fight off infection. If you do not have enough CD4 cells, you can develop infections, cancers, and other health problems.  With treatment, HIV may not progress quickly. It is possible for people with HIV to live for many years in good health and to have families. Now it is safer than ever for people with HIV to have children.  What actions can I take to have a safe pregnancy with HIV?  Before pregnancy         Talk with your health care provider, case managers, social workers, and other support systems about your plans to get pregnant. Your health care provider may recommend that you and your partner:   Have a complete review of physical health and medical history, including labs, antiretroviral therapy (ART), and other medicines.   Get tested and treated for other sexually transmitted infections (STIs).   Take steps to decrease the amount of HIV (viral load) in your body to make it as low as possible (undetectable). To do this:  ? Take your ART medicines regularly.  ? Visit your health care provider for regular care and checkups.   Use birth control (contraception) until you are ready to get pregnant.   Only have unprotected sex when you know that you can get pregnant (fertile). Otherwise, use condoms. Ask your health care providers how to know when you are most fertile during the month.  If your partner does not have HIV, take steps to prevent the spread of the virus. This may include:   Using artificial insemination to get pregnant. You may use your partner's semen or, if suitable, a donor's semen. This is the safest way to get pregnant as it protects your partner.   Taking pre-exposure prophylaxis (PrEP) medicines. These help decrease your partner's chances of getting HIV.  Your partner should begin PrEP a month before you start trying to get pregnant and continue using it for a month after you become pregnant.   Having regular testing for HIV. This helps you to know your viral load. It can also detect HIV early if it gets passed on to your partner.  Additionally, you should:   Stop using alcohol, tobacco, and drugs.   Take a multivitamin that contains at least 400 mcg of folic acid.   Manage any ART side effects, such as anemia or high blood sugar.    During pregnancy  If you are already pregnant, talk with your health care provider as soon as possible. Discuss ways to make pregnancy safe for you and your baby. You will also need to:   Find a health care provider who specializes in pregnancy (obstetrician) or a nurse-midwife.   Tell your pregnancy provider that you have HIV.   Take prenatal vitamins.   Eat a balanced diet that includes plenty of fruits and vegetables, lean proteins, whole grains, and dairy.   Go to regularly scheduled prenatal visits.   Be sure that your HIV health care team and your pregnancy health care team work together to manage your care.  Where to find more information  If you or your partner has HIV, learn more about how to plan for pregnancy. Visit:   The American College of Obstetricians and Gynecologists: www.acog.org   The Centers for Disease Control and Prevention: www.cdc.gov    Summary   It is possible for people with HIV to live for many years in good health and to have families.   Make sure you involve your HIV providers, case managers, social workers, and other HIV support systems when planning to get pregnant.   Visit your health care provider and take ART medicines regularly to decrease the amount of HIV (viral load) in your body before you get pregnant.   Use birth control (contraception) until you are ready to get pregnant. Only have unprotected sex when you know that you can get pregnant (fertile). Use condoms at all other  times.  This information is not intended to replace advice given to you by your health care provider. Make sure you discuss any questions you have with your health care provider.  Document Released: 12/31/2017 Document Revised: 12/31/2017 Document Reviewed: 12/31/2017  Elsevier Interactive Patient Education  2019 Elsevier Inc.

## 2018-12-25 ENCOUNTER — Telehealth: Payer: 59 | Admitting: Family

## 2018-12-25 DIAGNOSIS — R0602 Shortness of breath: Secondary | ICD-10-CM

## 2018-12-25 MED ORDER — ALBUTEROL SULFATE HFA 108 (90 BASE) MCG/ACT IN AERS: 2.0000 | INHALATION_SPRAY | Freq: Four times a day (QID) | RESPIRATORY_TRACT | 0 refills | Status: DC | PRN

## 2018-12-25 NOTE — Progress Notes (Signed)
Greater than 5 minutes, yet less than 10 minutes of time have been spent researching, coordinating, and implementing care for this patient today.  Thank you for the details you included in the comment boxes. Those details are very helpful in determining the best course of treatment for you and help [place] to provide the best care.  E-Visit for Corona Virus Screening  Based on your current symptoms, it seems unlikely that your symptoms are related to the Coronavirus.   Coronavirus disease 2019 (COVID-19) is a respiratory illness that can spread from person to person. The virus that causes COVID-19 is a new virus that was first identified in the country of [place] but is now found in multiple other countries and has spread to the [place].  Symptoms associated with the virus are mild to severe fever, cough, and shortness of breath. There is currently no vaccine to protect against COVID-19, and there is no specific antiviral treatment for the virus.   To be considered HIGH RISK for Coronavirus (COVID-19), you have to meet the following criteria:  . Traveled to [place], [place], [place], [place] or [place]; or in the [place] to [place], [place], [place], or [place]; and have fever, cough, and shortness of breath within the last 2 weeks of travel OR  . Been in close contact with a person diagnosed with COVID-19 within the last 2 weeks and have fever, cough, and shortness of breath  . IF YOU DO NOT MEET THESE CRITERIA, YOU ARE CONSIDERED LOW RISK FOR COVID-19.   It is vitally important that if you feel that you have an infection such as this virus or any other virus that you stay home and away from places where you may spread it to others.  You should self-quarantine for 14 days if you have symptoms that could potentially be coronavirus and avoid contact with people age 45 and older.   You can use medication such as A prescription inhaler called Albuterol MDI 90 mcg /actuation 2 puffs every 4  hours as needed for shortness of breath, wheezing, cough  You may also take acetaminophen (Tylenol) as needed for fever.   Reduce your risk of any infection by using the same precautions used for avoiding the common cold or flu:  Marland Kitchen Wash your hands often with soap and warm water for at least 20 seconds.  If soap and water are not readily available, use an alcohol-based hand sanitizer with at least 60% alcohol.  . If coughing or sneezing, cover your mouth and nose by coughing or sneezing into the elbow areas of your shirt or coat, into a tissue or into your sleeve (not your hands). . Avoid shaking hands with others and consider head nods or verbal greetings only. . Avoid touching your eyes, nose, or mouth with unwashed hands.  . Avoid close contact with people who are sick. . Avoid places or events with large numbers of people in one location, like concerts or sporting events. . Carefully consider travel plans you have or are making. . If you are planning any travel outside or inside the [place], visit the CDC's Travelers' Health webpage for the latest health notices. . If you have some symptoms but not all symptoms, continue to monitor at home and seek medical attention if your symptoms worsen. . If you are having a medical emergency, call 911.  HOME CARE . Only take medications as instructed by your medical team. . Drink plenty of fluids and get plenty of  rest. . A steam or ultrasonic humidifier can help if you have congestion.   GET HELP RIGHT AWAY IF: . You develop worsening fever. . You become short of breath . You cough up blood. . Your symptoms become more severe MAKE SURE YOU   Understand these instructions.  Will watch your condition.  Will get help right away if you are not doing well or get worse.  Your e-visit answers were reviewed by a board certified advanced clinical practitioner to complete your personal care plan.  Depending on the condition, your plan could have included  both over the counter or prescription medications.  If there is a problem please reply once you have received a response from your provider. Your safety is important to [place].  If you have drug allergies check your prescription carefully.    You can use MyChart to ask questions about today's visit, request a non-urgent call back, or ask for a work or school excuse for 24 hours related to this e-Visit. If it has been greater than 24 hours you will need to follow up with your provider, or enter a new e-Visit to address those concerns. You will get an e-mail in the next two days asking about your experience.  I hope that your e-visit has been valuable and will speed your recovery. Thank you for using e-visits.

## 2019-01-11 ENCOUNTER — Telehealth: Payer: Self-pay | Admitting: *Deleted

## 2019-01-11 NOTE — Telephone Encounter (Signed)
Pt called in stating she is around [redacted] weeks pregnant and is having some dizzy spells and wasn't sure if this was normal or not. Pt states she is eating and drinking plenty of fluids and doesn't notice the dizziness is worse with movements. Instructed pt that this could be apart of her pregnancy and to continue to monitor and if it gets worse or gets symptomatic to call office or to to MAU. Pt has new OB appointment in May.

## 2019-01-20 ENCOUNTER — Other Ambulatory Visit: Payer: Self-pay

## 2019-01-20 ENCOUNTER — Other Ambulatory Visit: Payer: 59

## 2019-02-23 ENCOUNTER — Encounter: Payer: 59 | Admitting: Obstetrics and Gynecology

## 2019-03-02 ENCOUNTER — Other Ambulatory Visit: Payer: Self-pay

## 2019-03-02 ENCOUNTER — Ambulatory Visit (INDEPENDENT_AMBULATORY_CARE_PROVIDER_SITE_OTHER): Payer: 59 | Admitting: Family Medicine

## 2019-03-02 ENCOUNTER — Encounter: Payer: Self-pay | Admitting: Family Medicine

## 2019-03-02 VITALS — BP 112/73 | HR 72 | Wt 143.0 lb

## 2019-03-02 DIAGNOSIS — Z113 Encounter for screening for infections with a predominantly sexual mode of transmission: Secondary | ICD-10-CM | POA: Diagnosis not present

## 2019-03-02 DIAGNOSIS — R87612 Low grade squamous intraepithelial lesion on cytologic smear of cervix (LGSIL): Secondary | ICD-10-CM

## 2019-03-02 DIAGNOSIS — Z3482 Encounter for supervision of other normal pregnancy, second trimester: Secondary | ICD-10-CM | POA: Diagnosis not present

## 2019-03-02 LAB — OB RESULTS CONSOLE GC/CHLAMYDIA: Gonorrhea: NEGATIVE

## 2019-03-02 MED ORDER — BD ASSURE BPM/MANUAL ARM CUFF MISC: 1.0000 | 0 refills | Status: DC

## 2019-03-02 NOTE — Progress Notes (Signed)
Last pap 2019- LSIL

## 2019-03-02 NOTE — Progress Notes (Addendum)
Subjective:   Gloria Dean is a 33 y.o. G3P2002 at Unknown by LMP being seen today for her first obstetrical visit.  Her obstetrical history is not significant. Patient does intend to breast feed. Pregnancy history fully reviewed. Full CPE on 06/23/2018.  Patient reports no complaints.  HISTORY: OB History  Gravida Para Term Preterm AB Living  3 2 2  0 0 2  SAB TAB Ectopic Multiple Live Births  0 0 0 0 2    # Outcome Date GA Lbr Len/2nd Weight Sex Delivery Anes PTL Lv  3 Current           2 Term 11/15/13 [redacted]w[redacted]d 17:45 / 00:02 6 lb 6 oz (2.892 kg) F Vag-Spont None  LIV     Name: Gloria Dean     Apgar1: 9  Apgar5: 9  1 Term 02/28/12 [redacted]w[redacted]d 09:35 / 00:14 5 lb 6.8 oz (2.46 kg) M Vag-Spont None  LIV     Name: Gloria Dean     Apgar1: 8  Apgar5: 9   Last pap smear was  06/23/18 and was abnormal - LGSIL, colposcopy showed benign cells  Past Medical History:  Diagnosis Date  . Abnormal Pap smear 2010  . Asthma   . Infection    chylamidia  . Preeclampsia 2113   Past Surgical History:  Procedure Laterality Date  . WISDOM TOOTH EXTRACTION     Family History  Problem Relation Age of Onset  . Cancer Maternal Grandfather        lung   Social History   Tobacco Use  . Smoking status: Former Smoker    Types: Cigarettes    Last attempt to quit: 07/09/2011    Years since quitting: 7.6  . Smokeless tobacco: Never Used  . Tobacco comment: socially  Substance Use Topics  . Alcohol use: Yes    Comment: social, not while pregnancy  . Drug use: No   No Known Allergies Current Outpatient Medications on File Prior to Visit  Medication Sig Dispense Refill  . albuterol (PROVENTIL HFA;VENTOLIN HFA) 108 (90 Base) MCG/ACT inhaler Inhale 2 puffs into the lungs every 6 (six) hours as needed for shortness of breath. 1 Inhaler 0  . cetirizine (ZYRTEC) 10 MG tablet Take 10 mg by mouth daily as needed. For allergies    . doxycycline (VIBRAMYCIN) 100 MG capsule     .  triamcinolone cream (KENALOG) 0.1 %      No current facility-administered medications on file prior to visit.      Exam   Vitals:   03/02/19 1517  BP: 112/73  Pulse: 72  Weight: 143 lb (64.9 kg)   Fetal Heart Rate (bpm): 158  System: General: well-developed, well-nourished female in no acute distress   Skin: normal coloration and turgor, no rashes   Neurologic: oriented, normal, negative, normal mood   Extremities: normal strength, tone, and muscle mass, ROM of all joints is normal   HEENT PERRLA, extraocular movement intact and sclera clear, anicteric   Mouth/Teeth mucous membranes moist, pharynx normal without lesions and dental hygiene good   Neck supple and no masses   Cardiovascular: regular rate and rhythm   Respiratory:  no respiratory distress, normal breath sounds   Abdomen: soft, non-tender; bowel sounds normal; no masses,  no organomegaly  Abdominal u/s - shows SIUP at 12 3/7 wks, + movement, + flicker   Assessment:   Pregnancy: [id number] Patient Active Problem List   Diagnosis Date Noted  . Supervision  of normal pregnancy 05/07/2013    Priority: High  . Genital warts 12/17/2011  . LGSIL on Pap smear of cervix 09/05/2011  . Asthma with allergic rhinitis 08/07/2011     Plan:  1. Encounter for supervision of other normal pregnancy in second trimester New OB labs - Babyscripts Schedule Optimization - Culture, OB Urine - Genetic Screening - Obstetric Panel, Including HIV - US MFM OB COMP + 14 WK; Future - Cervicovaginal ancillary only( [place])   Initial labs drawn. Continue prenatal vitamins. Genetic Screening discussed, NIPS: ordered. Ultrasound discussed; fetal anatomic survey: ordered. Problem list reviewed and updated. The nature of [facility] with multiple MDs and other Advanced Practice Providers was explained to patient; also emphasized that residents, students are part of our team. Routine obstetric  precautions reviewed. Return in about 8 weeks (around 04/27/2019) for virtual.

## 2019-03-03 ENCOUNTER — Encounter: Payer: Self-pay | Admitting: *Deleted

## 2019-03-03 LAB — OBSTETRIC PANEL, INCLUDING HIV
Antibody Screen: NEGATIVE
Basophils Absolute: 0.1 10*3/uL (ref 0.0–0.2)
Basos: 1 %
EOS (ABSOLUTE): 0.2 10*3/uL (ref 0.0–0.4)
Eos: 2 %
HIV Screen 4th Generation wRfx: NONREACTIVE
Hematocrit: 36.6 % (ref 34.0–46.6)
Hemoglobin: 12 g/dL (ref 11.1–15.9)
Hepatitis B Surface Ag: NEGATIVE
Immature Grans (Abs): 0 10*3/uL (ref 0.0–0.1)
Immature Granulocytes: 0 %
Lymphocytes Absolute: 2 10*3/uL (ref 0.7–3.1)
Lymphs: 22 %
MCH: 29.3 pg (ref 26.6–33.0)
MCHC: 32.8 g/dL (ref 31.5–35.7)
MCV: 90 fL (ref 79–97)
Monocytes Absolute: 0.5 10*3/uL (ref 0.1–0.9)
Monocytes: 5 %
Neutrophils Absolute: 6.4 10*3/uL (ref 1.4–7.0)
Neutrophils: 70 %
Platelets: 259 10*3/uL (ref 150–450)
RBC: 4.09 x10E6/uL (ref 3.77–5.28)
RDW: 13.1 % (ref 11.7–15.4)
RPR Ser Ql: NONREACTIVE
Rh Factor: NEGATIVE
Rubella Antibodies, IGG: 4.89 index (ref >0.99)
WBC: 9.2 10*3/uL (ref 3.4–10.8)

## 2019-03-04 ENCOUNTER — Telehealth: Payer: Self-pay | Admitting: *Deleted

## 2019-03-04 LAB — CERVICOVAGINAL ANCILLARY ONLY
Chlamydia: NEGATIVE
Neisseria Gonorrhea: NEGATIVE

## 2019-03-04 NOTE — Telephone Encounter (Signed)
Erroneous encounter

## 2019-03-05 LAB — CULTURE, OB URINE

## 2019-03-05 LAB — URINE CULTURE, OB REFLEX

## 2019-03-10 ENCOUNTER — Encounter: Payer: Self-pay | Admitting: *Deleted

## 2019-03-11 ENCOUNTER — Other Ambulatory Visit: Payer: Self-pay

## 2019-04-20 ENCOUNTER — Other Ambulatory Visit: Payer: Self-pay | Admitting: Family Medicine

## 2019-04-20 ENCOUNTER — Other Ambulatory Visit: Payer: Self-pay

## 2019-04-20 ENCOUNTER — Ambulatory Visit (HOSPITAL_COMMUNITY)
Admission: RE | Admit: 2019-04-20 | Discharge: 2019-04-20 | Disposition: A | Discharge status: Home / Self Care | Payer: 59 | Source: Ambulatory Visit | Attending: Obstetrics and Gynecology | Admitting: Obstetrics and Gynecology

## 2019-04-20 ENCOUNTER — Other Ambulatory Visit (HOSPITAL_COMMUNITY): Payer: Self-pay | Admitting: *Deleted

## 2019-04-20 DIAGNOSIS — Z3482 Encounter for supervision of other normal pregnancy, second trimester: Secondary | ICD-10-CM | POA: Diagnosis not present

## 2019-04-20 DIAGNOSIS — Z3A19 19 weeks gestation of pregnancy: Secondary | ICD-10-CM

## 2019-04-20 DIAGNOSIS — O09292 Supervision of pregnancy with other poor reproductive or obstetric history, second trimester: Secondary | ICD-10-CM

## 2019-04-27 ENCOUNTER — Telehealth (INDEPENDENT_AMBULATORY_CARE_PROVIDER_SITE_OTHER): Payer: 59 | Admitting: Obstetrics and Gynecology

## 2019-04-27 ENCOUNTER — Other Ambulatory Visit: Payer: Self-pay

## 2019-04-27 VITALS — BP 117/72 | Wt 149.0 lb

## 2019-04-27 DIAGNOSIS — O26892 Other specified pregnancy related conditions, second trimester: Secondary | ICD-10-CM

## 2019-04-27 DIAGNOSIS — Z3480 Encounter for supervision of other normal pregnancy, unspecified trimester: Secondary | ICD-10-CM

## 2019-04-27 DIAGNOSIS — Z3A20 20 weeks gestation of pregnancy: Secondary | ICD-10-CM

## 2019-04-27 DIAGNOSIS — Z6791 Unspecified blood type, Rh negative: Secondary | ICD-10-CM

## 2019-04-27 DIAGNOSIS — O360920 Maternal care for other rhesus isoimmunization, second trimester, not applicable or unspecified: Secondary | ICD-10-CM

## 2019-04-27 NOTE — Progress Notes (Signed)
I connected with  [name] on 04/27/19 at  8:15 AM EDT by telephone and verified that I am speaking with the correct person using two identifiers.   I discussed the limitations, risks, security and privacy concerns of performing an evaluation and management service by telephone and the availability of in person appointments. I also discussed with the patient that there may be a patient responsible charge related to this service. The patient expressed understanding and agreed to proceed.  Crosby Oyster, RN 04/27/2019  8:19 AM

## 2019-04-27 NOTE — Progress Notes (Signed)
   TELEHEALTH VIRTUAL OBSTETRICS VISIT ENCOUNTER NOTE  Clinic: [facility]  I connected with [name] on 04/27/19 at  8:15 AM EDT by telephone at home and verified that I am speaking with the correct person using two identifiers.   I discussed the limitations, risks, security and privacy concerns of performing an evaluation and management service by telephone and the availability of in person appointments. I also discussed with the patient that there may be a patient responsible charge related to this service. The patient expressed understanding and agreed to proceed.  Subjective:  Gloria Dean is a 33 y.o. G3P2002 at [redacted]w[redacted]d being followed for ongoing prenatal care.  She is currently monitored for the following issues for this low-risk pregnancy and has Asthma with allergic rhinitis; LGSIL on Pap smear of cervix; Genital warts; Supervision of normal pregnancy; and Rh negative state in antepartum period on their problem list.  Patient reports no complaints. Reports fetal movement. Denies any contractions, bleeding or leaking of fluid.   The following portions of the patient's history were reviewed and updated as appropriate: allergies, current medications, past family history, past medical history, past social history, past surgical history and problem list.   Objective:   Vitals:   04/27/19 0817  BP: 117/72  Weight: 149 lb (67.6 kg)    Babyscripts Data Reviewed: yes  General:  Alert, oriented and cooperative.   Mental Status: Normal mood and affect perceived. Normal judgment and thought content.  Rest of physical exam deferred due to type of encounter  Assessment and Plan:  Pregnancy: G3P2002 at [redacted]w[redacted]d 1. Rh negative state in antepartum period Rhogam and ab screen at 28wks and postpartum prn  2. Supervision of other normal pregnancy, antepartum Routine care. Has f/u anatomy u/s next month  Preterm labor symptoms and general obstetric precautions  including but not limited to vaginal bleeding, contractions, leaking of fluid and fetal movement were reviewed in detail with the patient.  I discussed the assessment and treatment plan with the patient. The patient was provided an opportunity to ask questions and all were answered. The patient agreed with the plan and demonstrated an understanding of the instructions. The patient was advised to call back or seek an in-person office evaluation/go to [facility] at [facility] for any urgent or concerning symptoms. Please refer to After Visit Summary for other counseling recommendations.   I provided 7 minutes of non-face-to-face time during this encounter. The visit was conducted via [place]  Return in about 1 month (around 05/28/2019) for virtual rob.  Future Appointments  Date Time Provider Portal  05/18/2019  3:30 PM WH-MFC [place] 5 WH-MFCUS MFC-US    Daneesha Quinteros, [place] for [facility] [place]

## 2019-05-18 ENCOUNTER — Ambulatory Visit (HOSPITAL_COMMUNITY)
Admission: RE | Admit: 2019-05-18 | Discharge: 2019-05-18 | Disposition: A | Discharge status: Home / Self Care | Payer: 59 | Source: Ambulatory Visit | Attending: Obstetrics and Gynecology | Admitting: Obstetrics and Gynecology

## 2019-05-18 ENCOUNTER — Other Ambulatory Visit: Payer: Self-pay

## 2019-05-18 DIAGNOSIS — O43192 Other malformation of placenta, second trimester: Secondary | ICD-10-CM

## 2019-05-18 DIAGNOSIS — O09292 Supervision of pregnancy with other poor reproductive or obstetric history, second trimester: Secondary | ICD-10-CM | POA: Diagnosis not present

## 2019-05-18 DIAGNOSIS — Z3A23 23 weeks gestation of pregnancy: Secondary | ICD-10-CM | POA: Diagnosis not present

## 2019-05-18 DIAGNOSIS — Z362 Encounter for other antenatal screening follow-up: Secondary | ICD-10-CM

## 2019-05-19 ENCOUNTER — Other Ambulatory Visit (HOSPITAL_COMMUNITY): Payer: Self-pay | Admitting: *Deleted

## 2019-05-19 DIAGNOSIS — O43192 Other malformation of placenta, second trimester: Secondary | ICD-10-CM

## 2019-05-24 ENCOUNTER — Encounter: Payer: Self-pay | Admitting: Family Medicine

## 2019-05-24 ENCOUNTER — Telehealth (INDEPENDENT_AMBULATORY_CARE_PROVIDER_SITE_OTHER): Payer: 59 | Admitting: Family Medicine

## 2019-05-24 ENCOUNTER — Other Ambulatory Visit: Payer: Self-pay

## 2019-05-24 VITALS — Wt 153.0 lb

## 2019-05-24 DIAGNOSIS — Z3480 Encounter for supervision of other normal pregnancy, unspecified trimester: Secondary | ICD-10-CM

## 2019-05-24 DIAGNOSIS — Z6791 Unspecified blood type, Rh negative: Secondary | ICD-10-CM

## 2019-05-24 DIAGNOSIS — O26892 Other specified pregnancy related conditions, second trimester: Secondary | ICD-10-CM

## 2019-05-24 DIAGNOSIS — O360120 Maternal care for anti-D [Rh] antibodies, second trimester, not applicable or unspecified: Secondary | ICD-10-CM

## 2019-05-24 DIAGNOSIS — Z3A24 24 weeks gestation of pregnancy: Secondary | ICD-10-CM

## 2019-05-24 NOTE — Progress Notes (Signed)
TELEHEALTH OBSTETRICS PRENATAL VIRTUAL VIDEO VISIT ENCOUNTER NOTE  [facility]   I connected with Gloria DallasShannon K Kowal on 05/24/19 at  1:45 PM EDT by Doxy Video Encounter at home and verified that I am speaking with the correct person using two identifiers.   I discussed the limitations, risks, security and privacy concerns of performing an evaluation and management service virtually and the availability of in person appointments. I also discussed with the patient that there may be a patient responsible charge related to this service. The patient expressed understanding and agreed to proceed. Subjective:  Gloria Dean is a 33 y.o. G3P2002 at 5870w1d being seen today for ongoing prenatal care.  She is currently monitored for the following issues for this low-risk pregnancy and has Asthma with allergic rhinitis; LGSIL on Pap smear of cervix; Genital warts; Supervision of normal pregnancy; and Rh negative state in antepartum period on their problem list.  Patient reports low pelvic pain, radiating into legs, worse with standing or walking..  Contractions: Not present. Vag. Bleeding: None.  Movement: Present. Denies any leaking of fluid.   The following portions of the patient's history were reviewed and updated as appropriate: allergies, current medications, past family history, past medical history, past social history, past surgical history and problem list.   Objective:   Vitals:   05/24/19 1339  Weight: 153 lb (69.4 kg)    Fetal Status:     Movement: Present     General:  Alert, oriented and cooperative. Patient is in no acute distress.  Respiratory: Normal respiratory effort, no problems with respiration noted  Mental Status: Normal mood and affect. Normal behavior. Normal judgment and thought content.  Rest of physical exam deferred due to type of encounter  Imaging: Kore[place] Mfm Ob Follow Up  Result Date: 05/18/2019  ----------------------------------------------------------------------  OBSTETRICS REPORT                       (Signed Final 05/18/2019 04:08 pm) ---------------------------------------------------------------------- Patient Info  ID #:       [id number]                          D.O.B.:  Jun 17, 1986 (32 yrs)  Name:       Gloria Dean                Visit Date: 05/18/2019 03:53 pm ---------------------------------------------------------------------- Performed By  Performed By:     Marcellina MillinKelly L Moser          Ref. Address:     [address]                    [address]                                                             [address]                                                             [place], [place]  [address]  Attending:        Lin Landsmanorenthian Booker      [facility]   [address]      [facility]                    MD                                       Fetal Care  Referred By:      Reva BoresANYA S Amarian Botero                    MD ---------------------------------------------------------------------- Orders   #  Description                          Code         Ordered By   1  US MFM OB FOLLOW UP                  [id number]     Lin LandsmanORENTHIAN                                                        BOOKER  ----------------------------------------------------------------------   #  Order #                    Accession #                 Episode #   1  [id number]                  [id number]                  [id number]  ---------------------------------------------------------------------- Indications   Poor obstetric history: Previous               O09.299   preeclampsia / eclampsia/gestational HTN   (preeclampsia 28 weeks)   [redacted] weeks gestation of pregnancy                Z3A.23   Marginal insertion of umbilical cord affecting O43.192   management of mother in second trimester  ---------------------------------------------------------------------- Fetal Evaluation  Num Of Fetuses:         1   Fetal Heart Rate(bpm):  149  Cardiac Activity:       Observed  Presentation:           Variable  Placenta:               Posterior  P. Cord Insertion:      Marginal insertion  Amniotic Fluid  AFI FV:      Within normal limits ---------------------------------------------------------------------- Biometry  BPD:      56.7  mm     G. Age:  23w 2d         46  %    CI:        69.39   %    70 - 86  FL/HC:      19.3   %    19.2 - 20.8  HC:      217.3  mm     G. Age:  23w 5d         54  %    HC/AC:      1.05        1.05 - 1.21  AC:      206.7  mm     G. Age:  25w 2d         92  %    FL/BPD:     74.1   %    71 - 87  FL:         42  mm     G. Age:  23w 5d         51  %    FL/AC:      20.3   %    20 - 24  Est. FW:     695  gm      1 lb 9 oz     91  % ---------------------------------------------------------------------- Gestational Age  LMP:           23w 2d        Date:  12/06/18                 EDD:   09/12/19  U/S Today:     24w 0d                                        EDD:   09/07/19  Best:          23w 2d     Det. By:  LMP  (12/06/18)          EDD:   09/12/19 ---------------------------------------------------------------------- Anatomy  Cranium:               Appears normal         Aortic Arch:            Previously seen  Cavum:                 Appears normal         Ductal Arch:            Appears normal  Ventricles:            Appears normal         Diaphragm:              Appears normal  Choroid Plexus:        Appears normal         Stomach:                Appears normal, left                                                                        sided  Cerebellum:            Appears normal         Abdomen:  Appears normal  Posterior Fossa:       Appears normal         Abdominal Wall:         Previously seen  Nuchal Fold:           Previously seen        Cord Vessels:           Previously seen  Face:                  Appears normal         Kidneys:                 Appear normal                         (orbits and profile)  Lips:                  Previously seen        Bladder:                Appears normal  Thoracic:              Appears normal         Spine:                  Previously seen  Heart:                 Appears normal         Upper Extremities:      Previously seen                         (4CH, axis, and                         situs)  RVOT:                  Appears normal         Lower Extremities:      Previously seen  LVOT:                  Appears normal  Other:  Fetus appears to be a female. Heels previously visualized. Right 5th          digit previously visualized. ---------------------------------------------------------------------- Cervix Uterus Adnexa  Cervix  Length:            3.2  cm.  Normal appearance by transabdominal scan. ---------------------------------------------------------------------- Impression  Normal interval growth.  Marginal cord insertion ---------------------------------------------------------------------- Recommendations  Follow up growth in 4 weeks given the finding of marginal  cord insertion. ----------------------------------------------------------------------               Sander Nephew, MD Electronically Signed Final Report   05/18/2019 04:08 pm ----------------------------------------------------------------------   Assessment and Plan:  Pregnancy: [id number] at [redacted]w[redacted]d 1. Supervision of other normal pregnancy, antepartum Continue routine prenatal care.   2. Rh negative state in antepartum period Will need Rhogam next visit.  Preterm labor symptoms and general obstetric precautions including but not limited to vaginal bleeding, contractions, leaking of fluid and fetal movement were reviewed in detail with the patient. I discussed the assessment and treatment plan with the patient. The patient was provided an opportunity to ask questions and all were answered. The patient agreed with the plan and  demonstrated an understanding of the instructions. The patient was advised to call back  or seek an in-person office evaluation/go to [facility] at [facility] for any urgent or concerning symptoms. Please refer to After Visit Summary for other counseling recommendations.   I provided 8 minutes of face-to-face time during this encounter.  Return in 4 weeks (on 06/21/2019) for [facility], 28 wk labs, in person.  Future Appointments  Date Time Provider Department Center  06/15/2019  3:15 PM [facility]-MFC [place] 4 WH-MFCUS MFC-US  06/16/2019 10:45 AM Federico Flake, MD [facility]    Reva Bores, MD [facility], [facility]

## 2019-05-24 NOTE — Patient Instructions (Signed)

## 2019-05-24 NOTE — Progress Notes (Signed)
   Patient has Mychart visit today, [redacted]w[redacted]d gestation. Patient is experiencing pelvic pain while walking that felt in her inner thighs. Pt stated "feels like a muscle pull". Pt would also like to discuss placental placement.       Derl Barrow, RN

## 2019-06-15 ENCOUNTER — Other Ambulatory Visit: Payer: Self-pay

## 2019-06-15 ENCOUNTER — Other Ambulatory Visit (HOSPITAL_COMMUNITY): Payer: Self-pay | Admitting: *Deleted

## 2019-06-15 ENCOUNTER — Ambulatory Visit (HOSPITAL_COMMUNITY)
Admission: RE | Admit: 2019-06-15 | Discharge: 2019-06-15 | Disposition: A | Discharge status: Home / Self Care | Payer: 59 | Source: Ambulatory Visit | Attending: Maternal & Fetal Medicine | Admitting: Maternal & Fetal Medicine

## 2019-06-15 DIAGNOSIS — O09292 Supervision of pregnancy with other poor reproductive or obstetric history, second trimester: Secondary | ICD-10-CM | POA: Diagnosis not present

## 2019-06-15 DIAGNOSIS — Z362 Encounter for other antenatal screening follow-up: Secondary | ICD-10-CM

## 2019-06-15 DIAGNOSIS — O43192 Other malformation of placenta, second trimester: Secondary | ICD-10-CM | POA: Insufficient documentation

## 2019-06-15 DIAGNOSIS — Z3A27 27 weeks gestation of pregnancy: Secondary | ICD-10-CM

## 2019-06-15 DIAGNOSIS — O43199 Other malformation of placenta, unspecified trimester: Secondary | ICD-10-CM

## 2019-06-16 ENCOUNTER — Ambulatory Visit (INDEPENDENT_AMBULATORY_CARE_PROVIDER_SITE_OTHER): Payer: 59 | Admitting: Family Medicine

## 2019-06-16 ENCOUNTER — Encounter: Payer: Self-pay | Admitting: Family Medicine

## 2019-06-16 ENCOUNTER — Other Ambulatory Visit: Payer: Self-pay

## 2019-06-16 VITALS — BP 118/74 | HR 120 | Wt 163.0 lb

## 2019-06-16 DIAGNOSIS — O360920 Maternal care for other rhesus isoimmunization, second trimester, not applicable or unspecified: Secondary | ICD-10-CM

## 2019-06-16 DIAGNOSIS — R87612 Low grade squamous intraepithelial lesion on cytologic smear of cervix (LGSIL): Secondary | ICD-10-CM

## 2019-06-16 DIAGNOSIS — Z23 Encounter for immunization: Secondary | ICD-10-CM | POA: Diagnosis not present

## 2019-06-16 DIAGNOSIS — O26892 Other specified pregnancy related conditions, second trimester: Secondary | ICD-10-CM

## 2019-06-16 DIAGNOSIS — Z3480 Encounter for supervision of other normal pregnancy, unspecified trimester: Secondary | ICD-10-CM

## 2019-06-16 DIAGNOSIS — Z3A27 27 weeks gestation of pregnancy: Secondary | ICD-10-CM

## 2019-06-16 DIAGNOSIS — Z6791 Unspecified blood type, Rh negative: Secondary | ICD-10-CM

## 2019-06-16 MED ORDER — RHO D IMMUNE GLOBULIN 1500 UNIT/2ML IJ SOSY
300.0000 ug | PREFILLED_SYRINGE | Freq: Once | INTRAMUSCULAR | Status: AC
  Administered 2019-06-16: 300 ug via INTRAMUSCULAR

## 2019-06-16 NOTE — Progress Notes (Signed)
   PRENATAL VISIT NOTE  Subjective:  Gloria Dean is a 33 y.o. G3P2002 at [redacted]w[redacted]d being seen today for ongoing prenatal care.  She is currently monitored for the following issues for this low-risk pregnancy and has Asthma with allergic rhinitis; LGSIL on Pap smear of cervix; Genital warts; Supervision of normal pregnancy; and Rh negative state in antepartum period on their problem list.  Patient reports no complaints.  Contractions: Not present. Vag. Bleeding: None.  Movement: Present. Denies leaking of fluid.   The following portions of the patient's history were reviewed and updated as appropriate: allergies, current medications, past family history, past medical history, past social history, past surgical history and problem list.   Objective:   Vitals:   06/16/19 0904  BP: 118/74  Pulse: (!) 120  Weight: 163 lb (73.9 kg)    Fetal Status: Fetal Heart Rate (bpm): 152   Movement: Present     General:  Alert, oriented and cooperative. Patient is in no acute distress.  Skin: Skin is warm and dry. No rash noted.   Cardiovascular: Normal heart rate noted  Respiratory: Normal respiratory effort, no problems with respiration noted  Abdomen: Soft, gravid, appropriate for gestational age.  Pain/Pressure: Absent     Pelvic: Cervical exam deferred        Extremities: Normal range of motion.  Edema: Trace  Mental Status: Normal mood and affect. Normal behavior. Normal judgment and thought content.   Assessment and Plan:  Pregnancy: G3P2002 at [redacted]w[redacted]d 1. Supervision of other normal pregnancy, antepartum Up to date  - Glucose Tolerance, 2 Hours w/1 Hour - CBC - RPR - HIV Antibody (routine testing w rflx) - Antibody screen  2. Rh negative state in antepartum period RHOGAM TODAY   3. LGSIL on Pap smear of cervix Needs repeat pap in Nov 2020  Preterm labor symptoms and general obstetric precautions including but not limited to vaginal bleeding, contractions, leaking of fluid and fetal  movement were reviewed in detail with the patient. Please refer to After Visit Summary for other counseling recommendations.   Return in about 2 weeks (around 06/30/2019) for Routine prenatal care, Telehealth/Virtual health OB Visit.  Future Appointments  Date Time Provider [place]  07/01/2019  9:30 AM Aletha Halim, MD [facility]  07/13/2019  2:30 PM WH-MFC [place] 5 WH-MFCUS MFC-US    Caren Macadam, MD

## 2019-06-16 NOTE — Progress Notes (Signed)
Hands are starting to go numb

## 2019-06-17 LAB — CBC
Hematocrit: 32.6 % — ABNORMAL LOW (ref 34.0–46.6)
Hemoglobin: 11.3 g/dL (ref 11.1–15.9)
MCH: 30.5 pg (ref 26.6–33.0)
MCHC: 34.7 g/dL (ref 31.5–35.7)
MCV: 88 fL (ref 79–97)
Platelets: 235 10*3/uL (ref 150–450)
RBC: 3.7 x10E6/uL — ABNORMAL LOW (ref 3.77–5.28)
RDW: 13 % (ref 11.7–15.4)
WBC: 13.5 10*3/uL — ABNORMAL HIGH (ref 3.4–10.8)

## 2019-06-17 LAB — HIV ANTIBODY (ROUTINE TESTING W REFLEX): HIV Screen 4th Generation wRfx: NONREACTIVE

## 2019-06-17 LAB — GLUCOSE TOLERANCE, 2 HOURS W/ 1HR
Glucose, 1 hour: 132 mg/dL (ref 65–179)
Glucose, 2 hour: 118 mg/dL (ref 65–152)
Glucose, Fasting: 88 mg/dL (ref 65–91)

## 2019-06-17 LAB — ANTIBODY SCREEN: Antibody Screen: NEGATIVE

## 2019-06-17 LAB — RPR: RPR Ser Ql: NONREACTIVE

## 2019-07-01 ENCOUNTER — Telehealth (INDEPENDENT_AMBULATORY_CARE_PROVIDER_SITE_OTHER): Payer: 59 | Admitting: Obstetrics and Gynecology

## 2019-07-01 ENCOUNTER — Other Ambulatory Visit: Payer: Self-pay

## 2019-07-01 VITALS — BP 120/73 | Wt 160.0 lb

## 2019-07-01 DIAGNOSIS — Z6791 Unspecified blood type, Rh negative: Secondary | ICD-10-CM

## 2019-07-01 DIAGNOSIS — Z3483 Encounter for supervision of other normal pregnancy, third trimester: Secondary | ICD-10-CM

## 2019-07-01 DIAGNOSIS — O43199 Other malformation of placenta, unspecified trimester: Secondary | ICD-10-CM

## 2019-07-01 DIAGNOSIS — O26893 Other specified pregnancy related conditions, third trimester: Secondary | ICD-10-CM

## 2019-07-01 DIAGNOSIS — R87612 Low grade squamous intraepithelial lesion on cytologic smear of cervix (LGSIL): Secondary | ICD-10-CM

## 2019-07-01 DIAGNOSIS — Z3A29 29 weeks gestation of pregnancy: Secondary | ICD-10-CM

## 2019-07-01 DIAGNOSIS — O3663X0 Maternal care for excessive fetal growth, third trimester, not applicable or unspecified: Secondary | ICD-10-CM

## 2019-07-01 DIAGNOSIS — O43193 Other malformation of placenta, third trimester: Secondary | ICD-10-CM

## 2019-07-01 NOTE — Progress Notes (Signed)
   TELEHEALTH VIRTUAL OBSTETRICS VISIT ENCOUNTER NOTE  Clinic: [facility]  I connected with Gloria Dean on 07/01/19 at  9:30 AM EDT by telephone at home and verified that I am speaking with the correct person using two identifiers.   I discussed the limitations, risks, security and privacy concerns of performing an evaluation and management service by telephone and the availability of in person appointments. I also discussed with the patient that there may be a patient responsible charge related to this service. The patient expressed understanding and agreed to proceed.  Subjective:  Gloria Dean is a 33 y.o. G3P2002 at [redacted]w[redacted]d being followed for ongoing prenatal care.  She is currently monitored for the following issues for this low-risk pregnancy and has Asthma with allergic rhinitis; LGSIL on Pap smear of cervix; Genital warts; Supervision of normal pregnancy; Rh negative state in antepartum period; Marginal insertion of umbilical cord affecting management of mother; and Excessive fetal growth affecting management of mother in third trimester, antepartum on their problem list.  Patient reports low back discomfort. Reports fetal movement. Denies any contractions, bleeding or leaking of fluid.   The following portions of the patient's history were reviewed and updated as appropriate: allergies, current medications, past family history, past medical history, past social history, past surgical history and problem list.   Objective:   Vitals:   07/01/19 0921  BP: 120/73  Weight: 160 lb (72.6 kg)    Babyscripts Data Reviewed: yes  General:  Alert, oriented and cooperative.   Mental Status: Normal mood and affect perceived. Normal judgment and thought content.  Rest of physical exam deferred due to type of encounter  Assessment and Plan:  Pregnancy: G3P2002 at [redacted]w[redacted]d 1. Encounter for supervision of other normal pregnancy in third trimester Routine care. D/w pt  re: BC nv  2. Rh negative state in antepartum period S/p rhogam already  3. LGSIL on Pap smear of cervix Rpt pap PP  4. Excessive fetal growth affecting management of pregnancy in third trimester, single or unspecified fetus F/u growth in two weeks  5. Marginal insertion of umbilical cord affecting management of mother See above  Preterm labor symptoms and general obstetric precautions including but not limited to vaginal bleeding, contractions, leaking of fluid and fetal movement were reviewed in detail with the patient.  I discussed the assessment and treatment plan with the patient. The patient was provided an opportunity to ask questions and all were answered. The patient agreed with the plan and demonstrated an understanding of the instructions. The patient was advised to call back or seek an in-person office evaluation/go to MAU at [facility] for any urgent or concerning symptoms. Please refer to After Visit Summary for other counseling recommendations.   I provided 10 minutes of non-face-to-face time during this encounter. The visit was conducted via MyChart-medicine  No follow-ups on file.  Future Appointments  Date Time Provider [place]  07/13/2019  2:30 PM WH-MFC [place] 5 WH-MFCUS MFC-US    Yilin Weedon, [place] for [facility], [place]

## 2019-07-01 NOTE — Progress Notes (Signed)
I connected with  Gloria Dean on 07/01/19 at  9:30 AM EDT by telephone and verified that I am speaking with the correct person using two identifiers.   I discussed the limitations, risks, security and privacy concerns of performing an evaluation and management service by telephone and the availability of in person appointments. I also discussed with the patient that there may be a patient responsible charge related to this service. The patient expressed understanding and agreed to proceed.  Crosby Oyster, RN 07/01/2019  9:22 AM

## 2019-07-13 ENCOUNTER — Other Ambulatory Visit (HOSPITAL_COMMUNITY): Payer: Self-pay | Admitting: *Deleted

## 2019-07-13 ENCOUNTER — Ambulatory Visit (HOSPITAL_COMMUNITY)
Admission: RE | Admit: 2019-07-13 | Discharge: 2019-07-13 | Disposition: A | Discharge status: Home / Self Care | Payer: 59 | Source: Ambulatory Visit | Attending: Obstetrics and Gynecology | Admitting: Obstetrics and Gynecology

## 2019-07-13 ENCOUNTER — Other Ambulatory Visit: Payer: Self-pay

## 2019-07-13 DIAGNOSIS — O09293 Supervision of pregnancy with other poor reproductive or obstetric history, third trimester: Secondary | ICD-10-CM

## 2019-07-13 DIAGNOSIS — Z3A31 31 weeks gestation of pregnancy: Secondary | ICD-10-CM

## 2019-07-13 DIAGNOSIS — O43199 Other malformation of placenta, unspecified trimester: Secondary | ICD-10-CM

## 2019-07-13 DIAGNOSIS — Z362 Encounter for other antenatal screening follow-up: Secondary | ICD-10-CM

## 2019-07-13 DIAGNOSIS — O43193 Other malformation of placenta, third trimester: Secondary | ICD-10-CM | POA: Diagnosis not present

## 2019-07-22 ENCOUNTER — Ambulatory Visit (INDEPENDENT_AMBULATORY_CARE_PROVIDER_SITE_OTHER): Payer: 59 | Admitting: Obstetrics & Gynecology

## 2019-07-22 ENCOUNTER — Encounter: Payer: Self-pay | Admitting: Obstetrics & Gynecology

## 2019-07-22 ENCOUNTER — Other Ambulatory Visit: Payer: Self-pay

## 2019-07-22 VITALS — BP 121/76 | HR 72 | Wt 167.6 lb

## 2019-07-22 DIAGNOSIS — O3663X0 Maternal care for excessive fetal growth, third trimester, not applicable or unspecified: Secondary | ICD-10-CM

## 2019-07-22 DIAGNOSIS — Z3A32 32 weeks gestation of pregnancy: Secondary | ICD-10-CM

## 2019-07-22 DIAGNOSIS — O43199 Other malformation of placenta, unspecified trimester: Secondary | ICD-10-CM

## 2019-07-22 DIAGNOSIS — Z3483 Encounter for supervision of other normal pregnancy, third trimester: Secondary | ICD-10-CM

## 2019-07-22 DIAGNOSIS — O43193 Other malformation of placenta, third trimester: Secondary | ICD-10-CM

## 2019-07-22 NOTE — Progress Notes (Signed)
PRENATAL VISIT NOTE  Subjective:  Gloria Dean is a 33 y.o. G3P2002 at [redacted]w[redacted]d being seen today for ongoing prenatal care.  She is currently monitored for the following issues for this low-risk pregnancy and has Asthma with allergic rhinitis; LGSIL on Pap smear of cervix; Genital warts; Supervision of normal pregnancy; Rh negative state in antepartum period; Marginal insertion of umbilical cord affecting management of mother; and Excessive fetal growth affecting management of mother in third trimester, antepartum on their problem list.  Patient reports no complaints.  Contractions: Not present.  .  Movement: Present. Denies leaking of fluid.   The following portions of the patient's history were reviewed and updated as appropriate: allergies, current medications, past family history, past medical history, past social history, past surgical history and problem list.   Objective:   Vitals:   07/22/19 0903  BP: 121/76  Pulse: 72  Weight: 167 lb 9.6 oz (76 kg)    Fetal Status: Fetal Heart Rate (bpm): 142   Movement: Present     General:  Alert, oriented and cooperative. Patient is in no acute distress.  Skin: Skin is warm and dry. No rash noted.   Cardiovascular: Normal heart rate noted  Respiratory: Normal respiratory effort, no problems with respiration noted  Abdomen: Soft, gravid, appropriate for gestational age.  Pain/Pressure: Present     Pelvic: Cervical exam deferred        Extremities: Normal range of motion.  Edema: None  Mental Status: Normal mood and affect. Normal behavior. Normal judgment and thought content.   Imaging: [place] Mfm Ob Follow Up  Result Date: 07/13/2019 ----------------------------------------------------------------------  OBSTETRICS REPORT                       (Signed Final 07/13/2019 04:35 pm) ---------------------------------------------------------------------- Patient Info  ID #:       [id number]                          D.O.B.:  November 09, 1985 (32 yrs)   Name:       Gloria Dean                Visit Date: 07/13/2019 02:36 pm ---------------------------------------------------------------------- Performed By  Performed By:     Lenise Arena        Ref. Address:     [address]                                                             [place], [place]  [address]  Attending:        Lin Landsman      [facility]                    MD                                       Fetal Care  Referred By:      Reva Bores                    MD ---------------------------------------------------------------------- Orders   #  Description                          Code         Ordered By   1  [place] MFM OB FOLLOW UP                  19147.82     Lin Landsman  ----------------------------------------------------------------------   #  Order #                    Accession #                 Episode #   1  [id number]                  [id number]                  [id number]  ---------------------------------------------------------------------- Indications   Marginal insertion of umbilical cord affecting O43.192   management of mother in second trimester   Poor obstetric history: Previous               O09.299   preeclampsia / eclampsia/gestational HTN   (preeclampsia 28 weeks)   [redacted] weeks gestation of pregnancy                Z3A.31  ---------------------------------------------------------------------- Vital Signs  Weight (lb): 160                               Height:        5'3"  BMI:         28.34 ---------------------------------------------------------------------- Fetal Evaluation  Num Of Fetuses:         1  Fetal Heart Rate(bpm):  146  Cardiac Activity:       Observed  Presentation:           Cephalic  Placenta:               Succenturiate  lobe  P. Cord Insertion:      Marginal insertion  Amniotic Fluid  AFI FV:      Within normal limits  AFI Sum(cm)     %Tile       Largest Pocket(cm)  11.72           29          4.53  RUQ(cm)       RLQ(cm)       LUQ(cm)        LLQ(cm)  2.79          4.53          1.9            2.5 ---------------------------------------------------------------------- Biometry  BPD:      77.9  mm     G. Age:  31w 2d         38  %    CI:        72.97   %    70 - 86                                                          FL/HC:      21.7   %    19.3 - 21.3  HC:      289.9  mm     G. Age:  31w 6d         30  %    HC/AC:      0.97        0.96 - 1.17  AC:      298.9  mm     G. Age:  33w 6d         97  %    FL/BPD:     80.6   %    71 - 87  FL:       62.8  mm     G. Age:  32w 4d         70  %    FL/AC:      21.0   %    20 - 24  LV:        4.1  mm  Est. FW:    2102  gm    4 lb 10 oz      90  % ---------------------------------------------------------------------- Gestational Age  LMP:           31w 2d        Date:  12/06/18                 EDD:   09/12/19  U/S Today:     32w 3d                                        EDD:   09/04/19  Best:          31w 2d     Det. By:  LMP  (12/06/18)          EDD:   09/12/19 ---------------------------------------------------------------------- Anatomy  Cranium:               Appears normal         Aortic Arch:            Previously seen  Cavum:                 Appears normal         Ductal Arch:            Previously seen  Ventricles:  Appears normal         Diaphragm:              Appears normal  Choroid Plexus:        Previously seen        Stomach:                Appears normal, left                                                                        sided  Cerebellum:            Previously seen        Abdomen:                Appears normal  Posterior Fossa:       Previously seen        Abdominal Wall:         Previously seen  Nuchal Fold:           Previously seen        Cord Vessels:            Previously seen  Face:                  Orbits and profile     Kidneys:                Appear normal                         previously seen  Lips:                  Previously seen        Bladder:                Appears normal  Thoracic:              Appears normal         Spine:                  Previously seen  Heart:                 Previously seen        Upper Extremities:      Previously seen  RVOT:                  Appears normal         Lower Extremities:      Previously seen  LVOT:                  Appears normal  Other:  Fetus appears to be a female. Heels previously visualized. Right 5th          digit previously visualized. ---------------------------------------------------------------------- Cervix Uterus Adnexa  Cervix  Not visualized (advanced GA >24wks) ---------------------------------------------------------------------- Impression  Normal interval growth.  Marginal cord insertion with succenturiate lobe  Normal amniotic fluid and fetal movement. ---------------------------------------------------------------------- Recommendations  Repeat growth in 6 week. ----------------------------------------------------------------------               Lin Landsmanorenthian Booker, MD Electronically Signed Final Report   07/13/2019 04:35 pm ----------------------------------------------------------------------   Assessment and Plan:  Pregnancy:  [id number] at [redacted]w[redacted]d 1. Excessive fetal growth affecting management of pregnancy in third trimester, single or unspecified fetus 2. Marginal insertion of umbilical cord affecting management of mother Ultrasound results discussed, all questions answered.  Will follow up rescan results. Concerned about  LGA and risks of shoulder dystocia et Karie Soda.  3. Encounter for supervision of other normal pregnancy in third trimester Preterm labor symptoms and general obstetric precautions including but not limited to vaginal bleeding, contractions, leaking of fluid and fetal movement  were reviewed in detail with the patient. Please refer to After Visit Summary for other counseling recommendations.   Return in about 2 weeks (around 08/05/2019) for Virtual OB Visit    4 weeks: OFFICE OB Visit, Pelvic cultures.  Future Appointments  Date Time Provider Department Center  08/25/2019  2:15 PM WH-MFC [place] 4 WH-MFCUS MFC-US    Jaynie Collins, MD

## 2019-07-22 NOTE — Patient Instructions (Signed)
Return to office for any scheduled appointments. Call the office or go to the MAU at [facility] if:  You begin to have strong, frequent contractions  Your water breaks.  Sometimes it is a big gush of fluid, sometimes it is just a trickle that keeps getting your panties wet or running down your legs  You have vaginal bleeding.  It is normal to have a small amount of spotting if your cervix was checked.   You do not feel your baby moving like normal.  If you do not, get something to eat and drink and lay down and focus on feeling your baby move.   If your baby is still not moving like normal, you should call the office or go to MAU.  Any other obstetric concerns.   

## 2019-08-05 ENCOUNTER — Other Ambulatory Visit: Payer: Self-pay | Admitting: Obstetrics and Gynecology

## 2019-08-05 ENCOUNTER — Other Ambulatory Visit: Payer: Self-pay

## 2019-08-05 ENCOUNTER — Telehealth (INDEPENDENT_AMBULATORY_CARE_PROVIDER_SITE_OTHER): Payer: 59 | Admitting: Obstetrics and Gynecology

## 2019-08-05 ENCOUNTER — Telehealth: Payer: Self-pay | Admitting: Radiology

## 2019-08-05 ENCOUNTER — Encounter: Payer: Self-pay | Admitting: Obstetrics and Gynecology

## 2019-08-05 VITALS — BP 123/86

## 2019-08-05 DIAGNOSIS — R519 Headache, unspecified: Secondary | ICD-10-CM

## 2019-08-05 DIAGNOSIS — Z3A34 34 weeks gestation of pregnancy: Secondary | ICD-10-CM

## 2019-08-05 DIAGNOSIS — O26899 Other specified pregnancy related conditions, unspecified trimester: Secondary | ICD-10-CM

## 2019-08-05 DIAGNOSIS — Z3483 Encounter for supervision of other normal pregnancy, third trimester: Secondary | ICD-10-CM

## 2019-08-05 DIAGNOSIS — O43199 Other malformation of placenta, unspecified trimester: Secondary | ICD-10-CM | POA: Insufficient documentation

## 2019-08-05 DIAGNOSIS — O26893 Other specified pregnancy related conditions, third trimester: Secondary | ICD-10-CM | POA: Insufficient documentation

## 2019-08-05 DIAGNOSIS — O3663X0 Maternal care for excessive fetal growth, third trimester, not applicable or unspecified: Secondary | ICD-10-CM

## 2019-08-05 DIAGNOSIS — G5600 Carpal tunnel syndrome, unspecified upper limb: Secondary | ICD-10-CM

## 2019-08-05 MED ORDER — MAGNESIUM MALATE 1250 (141.7 MG) MG PO TABS: 1.0000 | ORAL_TABLET | Freq: Every day | ORAL | 0 refills | Status: DC | PRN

## 2019-08-05 NOTE — Progress Notes (Signed)
   TELEHEALTH VIRTUAL OBSTETRICS VISIT ENCOUNTER NOTE  Clinic: [facility]  I connected with [name] on 08/05/19 at  2:30 PM EDT by telephone at home and verified that I am speaking with the correct person using two identifiers.   I discussed the limitations, risks, security and privacy concerns of performing an evaluation and management service by telephone and the availability of in person appointments. I also discussed with the patient that there may be a patient responsible charge related to this service. The patient expressed understanding and agreed to proceed.  Subjective:  Gloria Dean is a 33 y.o. G3P2002 at [redacted]w[redacted]d being followed for ongoing prenatal care.  She is currently monitored for the following issues for this high-risk pregnancy and has Asthma with allergic rhinitis; LGSIL on Pap smear of cervix; Genital warts; Supervision of normal pregnancy; Rh negative state in antepartum period; Marginal insertion of umbilical cord affecting management of mother; Excessive fetal growth affecting management of mother in third trimester, antepartum; Placenta succenturiata; Carpal tunnel syndrome during pregnancy; and Pregnancy headache in third trimester on their problem list.  Patient reports HA, CT syndrome s/s.   Reports fetal movement. Denies any contractions, bleeding or leaking of fluid.   The following portions of the patient's history were reviewed and updated as appropriate: allergies, current medications, past family history, past medical history, past social history, past surgical history and problem list.   Objective:   Vitals:   08/05/19 1419  BP: 123/86    Babyscripts Data Reviewed: yes  General:  Alert, oriented and cooperative.   Mental Status: Normal mood and affect perceived. Normal judgment and thought content.  Rest of physical exam deferred due to type of encounter  Assessment and Plan:  Pregnancy: G3P2002 at [redacted]w[redacted]d 1.  Pregnancy headache in third trimester No prior h/o and started last week, off and on. BPs normal. Will try po mag  2. Carpal tunnel syndrome during pregnancy Precautions given  3. Excessive fetal growth affecting management of pregnancy in third trimester, single or unspecified fetus F/u rpt u/s on 11/12  4. Encounter for supervision of other normal pregnancy in third trimester gbs nv  Preterm labor symptoms and general obstetric precautions including but not limited to vaginal bleeding, contractions, leaking of fluid and fetal movement were reviewed in detail with the patient.  I discussed the assessment and treatment plan with the patient. The patient was provided an opportunity to ask questions and all were answered. The patient agreed with the plan and demonstrated an understanding of the instructions. The patient was advised to call back or seek an in-person office evaluation/go to MAU at [facility] for any urgent or concerning symptoms. Please refer to After Visit Summary for other counseling recommendations.   I provided 7 minutes of non-face-to-face time during this encounter. The visit was conducted via MyChart-medicine  No follow-ups on file.  Future Appointments  Date Time Provider [place]  08/05/2019  2:30 PM Aletha Halim, MD [facility]  08/19/2019  3:30 PM Anyanwu, Sallyanne Havers, MD [facility]  08/25/2019  2:15 PM [place], [place] for [employer], [place]

## 2019-08-05 NOTE — Progress Notes (Signed)
I connected with  [name] on 08/05/19 at  2:30 PM EDT by telephone and verified that I am speaking with the correct person using two identifiers.   I discussed the limitations, risks, security and privacy concerns of performing an evaluation and management service by telephone and the availability of in person appointments. I also discussed with the patient that there may be a patient responsible charge related to this service. The patient expressed understanding and agreed to proceed.  Crosby Oyster, RN 08/05/2019  2:19 PM    Pt is getting headaches more often Carpal tunnel is getting worse

## 2019-08-05 NOTE — Telephone Encounter (Signed)
Left message for patient to call [facility] to schedule 36 wk ROB

## 2019-08-19 ENCOUNTER — Ambulatory Visit (INDEPENDENT_AMBULATORY_CARE_PROVIDER_SITE_OTHER): Payer: 59 | Admitting: Obstetrics & Gynecology

## 2019-08-19 ENCOUNTER — Other Ambulatory Visit: Payer: Self-pay

## 2019-08-19 VITALS — BP 115/73 | HR 91 | Wt 167.2 lb

## 2019-08-19 DIAGNOSIS — Z3480 Encounter for supervision of other normal pregnancy, unspecified trimester: Secondary | ICD-10-CM

## 2019-08-19 DIAGNOSIS — O43193 Other malformation of placenta, third trimester: Secondary | ICD-10-CM

## 2019-08-19 DIAGNOSIS — Z113 Encounter for screening for infections with a predominantly sexual mode of transmission: Secondary | ICD-10-CM | POA: Diagnosis not present

## 2019-08-19 DIAGNOSIS — Z3A36 36 weeks gestation of pregnancy: Secondary | ICD-10-CM

## 2019-08-19 DIAGNOSIS — O3663X0 Maternal care for excessive fetal growth, third trimester, not applicable or unspecified: Secondary | ICD-10-CM

## 2019-08-19 DIAGNOSIS — O43199 Other malformation of placenta, unspecified trimester: Secondary | ICD-10-CM

## 2019-08-19 NOTE — Patient Instructions (Signed)
Return to office for any scheduled appointments. Call the office or go to the MAU at [facility] [place] if:  You begin to have strong, frequent contractions  Your water breaks.  Sometimes it is a big gush of fluid, sometimes it is just a trickle that keeps getting your panties wet or running down your legs  You have vaginal bleeding.  It is normal to have a small amount of spotting if your cervix was checked.   You do not feel your baby moving like normal.  If you do not, get something to eat and drink and lay down and focus on feeling your baby move.   If your baby is still not moving like normal, you should call the office or go to MAU.  Any other obstetric concerns.   

## 2019-08-19 NOTE — Progress Notes (Signed)
PRENATAL VISIT NOTE  Subjective:  Gloria Dean is a 33 y.o. G3P2002 at [redacted]w[redacted]d being seen today for ongoing prenatal care.  She is currently monitored for the following issues for this low-risk pregnancy and has Asthma with allergic rhinitis; LGSIL on Pap smear of cervix; Genital warts; Supervision of normal pregnancy; Rh negative state in antepartum period; Marginal insertion of umbilical cord affecting management of mother; Excessive fetal growth affecting management of mother in third trimester, antepartum; Placenta succenturiata; Carpal tunnel syndrome during pregnancy; and Pregnancy headache in third trimester on their problem list.  Patient reports no complaints.  Contractions: Not present. Vag. Bleeding: None.  Movement: Present. Denies leaking of fluid.   The following portions of the patient's history were reviewed and updated as appropriate: allergies, current medications, past family history, past medical history, past social history, past surgical history and problem list.   Objective:   Vitals:   08/19/19 1535  BP: 115/73  Pulse: 91  Weight: 167 lb 3.2 oz (75.8 kg)    Fetal Status: Fetal Heart Rate (bpm): 145   Movement: Present  Presentation: Vertex  General:  Alert, oriented and cooperative. Patient is in no acute distress.  Skin: Skin is warm and dry. No rash noted.   Cardiovascular: Normal heart rate noted  Respiratory: Normal respiratory effort, no problems with respiration noted  Abdomen: Soft, gravid, appropriate for gestational age.  Pain/Pressure: Absent     Pelvic: Cervical exam performed Dilation: 1 Effacement (%): Thick Station: Ballotable  Extremities: Normal range of motion.  Edema: Trace  Mental Status: Normal mood and affect. Normal behavior. Normal judgment and thought content.   Imaging: [place] Mfm Ob Follow Up  Result Date: 07/13/2019 ----------------------------------------------------------------------  OBSTETRICS REPORT                        (Signed Final 07/13/2019 04:35 pm) ---------------------------------------------------------------------- Patient Info  ID #:       [id number]                          D.O.B.:  November 26, 1985 (32 yrs)  Name:       Gloria Dean                Visit Date: 07/13/2019 02:36 pm ---------------------------------------------------------------------- Performed By  Performed By:     Lenise Arena        Ref. Address:     [address]                                                             [place], [place]  [address]  Attending:        Lin Landsman      [facility]                    MD                                       [facility]  Referred By:      Reva Bores                    MD ---------------------------------------------------------------------- Orders   #  Description                          Code         Ordered By   1  [place] MFM OB FOLLOW UP                  60454.09     Lin Landsman  ----------------------------------------------------------------------   #  Order #                    Accession #                 Episode #   1  [id number]                  [id number]                  [id number]  ---------------------------------------------------------------------- Indications   Marginal insertion of umbilical cord affecting O43.192   management of mother in second trimester   Poor obstetric history: Previous               O09.299   preeclampsia / eclampsia/gestational HTN   (preeclampsia 28 weeks)   [redacted] weeks gestation of pregnancy                Z3A.31  ---------------------------------------------------------------------- Vital Signs  Weight (lb): 160                               Height:        5'3"  BMI:         28.34  ---------------------------------------------------------------------- Fetal Evaluation  Num Of Fetuses:         1  Fetal Heart Rate(bpm):  146  Cardiac Activity:       Observed  Presentation:           Cephalic  Placenta:               Succenturiate lobe  P. Cord Insertion:      Marginal insertion  Amniotic Fluid  AFI FV:      Within normal limits  AFI Sum(cm)     %Tile       Largest Pocket(cm)  11.72           29          4.53  RUQ(cm)       RLQ(cm)       LUQ(cm)        LLQ(cm)  2.79          4.53          1.9            2.5 ---------------------------------------------------------------------- Biometry  BPD:      77.9  mm     G. Age:  31w 2d         38  %    CI:        72.97   %    70 - 86                                                          FL/HC:      21.7   %    19.3 - 21.3  HC:      289.9  mm     G. Age:  31w 6d         30  %    HC/AC:      0.97        0.96 - 1.17  AC:      298.9  mm     G. Age:  33w 6d         97  %    FL/BPD:     80.6   %    71 - 87  FL:       62.8  mm     G. Age:  32w 4d         70  %    FL/AC:      21.0   %    20 - 24  LV:        4.1  mm  Est. FW:    2102  gm    4 lb 10 oz      90  % ---------------------------------------------------------------------- Gestational Age  LMP:           31w 2d        Date:  12/06/18                 EDD:   09/12/19  U/S Today:     32w 3d                                        EDD:   09/04/19  Best:          31w 2d     Det. By:  LMP  (12/06/18)          EDD:   09/12/19 ---------------------------------------------------------------------- Anatomy  Cranium:               Appears normal         Aortic Arch:            Previously seen  Cavum:                 Appears normal         Ductal Arch:            Previously seen  Ventricles:  Appears normal         Diaphragm:              Appears normal  Choroid Plexus:        Previously seen        Stomach:                Appears normal, left                                                                         sided  Cerebellum:            Previously seen        Abdomen:                Appears normal  Posterior Fossa:       Previously seen        Abdominal Wall:         Previously seen  Nuchal Fold:           Previously seen        Cord Vessels:           Previously seen  Face:                  Orbits and profile     Kidneys:                Appear normal                         previously seen  Lips:                  Previously seen        Bladder:                Appears normal  Thoracic:              Appears normal         Spine:                  Previously seen  Heart:                 Previously seen        Upper Extremities:      Previously seen  RVOT:                  Appears normal         Lower Extremities:      Previously seen  LVOT:                  Appears normal  Other:  Fetus appears to be a female. Heels previously visualized. Right 5th          digit previously visualized. ---------------------------------------------------------------------- Cervix Uterus Adnexa  Cervix  Not visualized (advanced GA >24wks) ---------------------------------------------------------------------- Impression  Normal interval growth.  Marginal cord insertion with succenturiate lobe  Normal amniotic fluid and fetal movement. ---------------------------------------------------------------------- Recommendations  Repeat growth in 6 week. ----------------------------------------------------------------------               Sander Nephew, MD Electronically Signed Final Report   07/13/2019 04:35 pm ----------------------------------------------------------------------  [place] Mfm Ob Follow Up  Result Date: 06/16/2019 ----------------------------------------------------------------------  OBSTETRICS REPORT                       (Signed Final 06/16/2019 10:04 am) ---------------------------------------------------------------------- Patient Info  ID #:       [id number]                          D.O.B.:  03/19/1986 (32 yrs)  Name:        Gloria Dean                Visit Date: 06/15/2019 03:37 pm ---------------------------------------------------------------------- Performed By  Performed By:     Marcellina Millin          Ref. Address:     [address]                                                             [place], [place]                                                             [address]  Attending:        Lin Landsman      [facility]                    MD                                       [facility]etal Care  Referred By:      Reva Bores                    MD ---------------------------------------------------------------------- Orders   #  Description                          Code         Ordered By   1  [place] MFM OB FOLLOW UP                  65784.69     Lin Landsman  ----------------------------------------------------------------------   #  Order #                    Accession #                 Episode #   1  [id number]                  [id number]                  [id number]  ---------------------------------------------------------------------- Indications   Marginal insertion of umbilical cord affecting O43.192   management of mother in second trimester   [redacted] weeks gestation of pregnancy                Z3A.27   Poor obstetric history: Previous               O09.299   preeclampsia / eclampsia/gestational HTN   (preeclampsia 28 weeks)  ---------------------------------------------------------------------- Fetal Evaluation  Num Of Fetuses:         1  Fetal Heart Rate(bpm):  175  Cardiac Activity:       Observed  Presentation:           Cephalic  Placenta:               Anterior  P. Cord Insertion:      Marginal insertion prev. seen  Amniotic Fluid  AFI FV:      Within normal limits                              Largest Pocket(cm)                              7.3  ---------------------------------------------------------------------- Biometry  BPD:      67.6  mm     G. Age:  27w 2d         36  %    CI:        68.71   %    70 - 86                                                          FL/HC:      19.6   %    18.6 - 20.4  HC:      260.6  mm     G. Age:  28w 2d         56  %    HC/AC:      1.01        1.05 - 1.21  AC:       258   mm     G. Age:  30w 0d         97  %    FL/BPD:     75.7   %    71 - 87  FL:       51.2  mm     G. Age:  27w 3d         38  %    FL/AC:      19.8   %    20 - 24  Est. FW:    1281  gm    2 lb 13  oz      91  % ---------------------------------------------------------------------- Gestational Age  LMP:           27w 2d        Date:  12/06/18                 EDD:   09/12/19  U/S Today:     28w 2d                                        EDD:   09/05/19  Best:          27w 2d     Det. By:  LMP  (12/06/18)          EDD:   09/12/19 ---------------------------------------------------------------------- Anatomy  Cranium:               Appears normal         Aortic Arch:            Appears normal  Cavum:                 Previously seen        Ductal Arch:            Appears normal  Ventricles:            Appears normal         Diaphragm:              Appears normal  Choroid Plexus:        Previously seen        Stomach:                Appears normal, left                                                                        sided  Cerebellum:            Previously seen        Abdomen:                Appears normal  Posterior Fossa:       Previously seen        Abdominal Wall:         Previously seen  Nuchal Fold:           Previously seen        Cord Vessels:           Previously seen  Face:                  Orbits and profile     Kidneys:                Appear normal                         previously seen  Lips:                  Previously seen        Bladder:                Appears normal  Thoracic:              Appears normal         Spine:                   Previously seen  Heart:                 Appears normal         Upper Extremities:      Previously seen                         (4CH, axis, and                         situs)  RVOT:                  Appears normal         Lower Extremities:      Previously seen  LVOT:                  Appears normal  Other:  Fetus appears to be a female. Heels previously visualized. Right 5th          digit previously visualized. ---------------------------------------------------------------------- Cervix Uterus Adnexa  Cervix  Not visualized (advanced GA >24wks) ---------------------------------------------------------------------- Impression  Normal interval growth.  Marginal cord insertion  Normal amniotic fluid and fetal movement. ---------------------------------------------------------------------- Recommendations  Follow up growth in 4 weeks. ----------------------------------------------------------------------               Lin Landsman, MD Electronically Signed Final Report   06/16/2019 10:04 am ----------------------------------------------------------------------  Assessment and Plan:  Pregnancy: [id number] at [redacted]w[redacted]d 1. Marginal insertion of umbilical cord affecting management of mother 2. Excessive fetal growth affecting management of pregnancy in third trimester, single or unspecified fetus Follow up scan scheduled 08/25/19, will follow up results and manage accordingly.  3. Supervision of other normal pregnancy, antepartum Pelvic cultures done. - GBS - GC/Chlamydia Preterm labor symptoms and general obstetric precautions including but not limited to vaginal bleeding, contractions, leaking of fluid and fetal movement were reviewed in detail with the patient. Please refer to After Visit Summary for other counseling recommendations.   Return in about 1 week (around 08/26/2019) for Virtual OB Visit.  Future Appointments  Date Time Provider Department Center  08/25/2019  2:15 PM [facility] [place] 4 [facility]  [facility]    Jaynie Collins, MD

## 2019-08-21 LAB — STREP GP B NAA: Strep Gp B NAA: NEGATIVE

## 2019-08-23 LAB — GC/CHLAMYDIA PROBE AMP (~~LOC~~) NOT AT ARMC
Chlamydia: NEGATIVE
Comment: NEGATIVE
Comment: NORMAL
Neisseria Gonorrhea: NEGATIVE

## 2019-08-24 ENCOUNTER — Other Ambulatory Visit: Payer: Self-pay

## 2019-08-24 ENCOUNTER — Telehealth (INDEPENDENT_AMBULATORY_CARE_PROVIDER_SITE_OTHER): Payer: 59 | Admitting: Obstetrics & Gynecology

## 2019-08-24 VITALS — BP 125/85 | Wt 172.0 lb

## 2019-08-24 DIAGNOSIS — Z3A37 37 weeks gestation of pregnancy: Secondary | ICD-10-CM

## 2019-08-24 DIAGNOSIS — O43193 Other malformation of placenta, third trimester: Secondary | ICD-10-CM

## 2019-08-24 DIAGNOSIS — O3663X0 Maternal care for excessive fetal growth, third trimester, not applicable or unspecified: Secondary | ICD-10-CM

## 2019-08-24 DIAGNOSIS — O43199 Other malformation of placenta, unspecified trimester: Secondary | ICD-10-CM

## 2019-08-24 DIAGNOSIS — Z3480 Encounter for supervision of other normal pregnancy, unspecified trimester: Secondary | ICD-10-CM

## 2019-08-24 NOTE — Progress Notes (Signed)
   TELEHEALTH OBSTETRICS PRENATAL VIRTUAL VIDEO VISIT ENCOUNTER NOTE  Provider location: [facility] [place] [facility]   I connected with Gloria Dean on 08/24/19 at  1:30 PM EST by MyChart Video Encounter at home and verified that I am speaking with the correct person using two identifiers.   I discussed the limitations, risks, security and privacy concerns of performing an evaluation and management service virtually and the availability of in person appointments. I also discussed with the patient that there may be a patient responsible charge related to this service. The patient expressed understanding and agreed to proceed. Subjective:  Gloria Dean is a 33 y.o. G3P2002 at [redacted]w[redacted]d being seen today for ongoing prenatal care.  She is currently monitored for the following issues for this low-risk pregnancy and has Asthma with allergic rhinitis; LGSIL on Pap smear of cervix; Genital warts; Supervision of normal pregnancy; Rh negative state in antepartum period; Marginal insertion of umbilical cord affecting management of mother; Excessive fetal growth affecting management of mother in third trimester, antepartum; Placenta succenturiata; Carpal tunnel syndrome during pregnancy; and Pregnancy headache in third trimester on their problem list.  Patient reports no complaints.  Contractions: Not present. Vag. Bleeding: None.  Movement: Present. Denies any leaking of fluid.   The following portions of the patient's history were reviewed and updated as appropriate: allergies, current medications, past family history, past medical history, past social history, past surgical history and problem list.   Objective:   Vitals:   08/24/19 1332  BP: 125/85    Fetal Status:     Movement: Present     General:  Alert, oriented and cooperative. Patient is in no acute distress.  Respiratory: Normal respiratory effort, no problems with respiration noted  Mental Status: Normal mood and  affect. Normal behavior. Normal judgment and thought content.  Rest of physical exam deferred due to type of encounter  Imaging: No results found.  Assessment and Plan:  Pregnancy: G3P2002 at [redacted]w[redacted]d 1. Marginal insertion of umbilical cord affecting management of mother  2. Supervision of other normal pregnancy, antepartum   3. Excessive fetal growth affecting management of pregnancy in third trimester, single or unspecified fetus   Preterm labor symptoms and general obstetric precautions including but not limited to vaginal bleeding, contractions, leaking of fluid and fetal movement were reviewed in detail with the patient. I discussed the assessment and treatment plan with the patient. The patient was provided an opportunity to ask questions and all were answered. The patient agreed with the plan and demonstrated an understanding of the instructions. The patient was advised to call back or seek an in-person office evaluation/go to MAU at [facility] for any urgent or concerning symptoms. Please refer to After Visit Summary for other counseling recommendations.   I provided 10 minutes of face-to-face time during this encounter.  No follow-ups on file.  Future Appointments  Date Time Provider [place]  08/25/2019  2:15 PM [place] for [employer], [place]

## 2019-08-25 ENCOUNTER — Ambulatory Visit (HOSPITAL_COMMUNITY)
Admission: RE | Admit: 2019-08-25 | Discharge: 2019-08-25 | Disposition: A | Discharge status: Home / Self Care | Payer: 59 | Source: Ambulatory Visit | Attending: Obstetrics and Gynecology | Admitting: Obstetrics and Gynecology

## 2019-08-25 DIAGNOSIS — Z362 Encounter for other antenatal screening follow-up: Secondary | ICD-10-CM

## 2019-08-25 DIAGNOSIS — O43199 Other malformation of placenta, unspecified trimester: Secondary | ICD-10-CM | POA: Insufficient documentation

## 2019-08-25 DIAGNOSIS — O43193 Other malformation of placenta, third trimester: Secondary | ICD-10-CM | POA: Diagnosis not present

## 2019-08-25 DIAGNOSIS — Z3A37 37 weeks gestation of pregnancy: Secondary | ICD-10-CM | POA: Diagnosis not present

## 2019-08-25 DIAGNOSIS — O09293 Supervision of pregnancy with other poor reproductive or obstetric history, third trimester: Secondary | ICD-10-CM | POA: Diagnosis not present

## 2019-08-31 ENCOUNTER — Other Ambulatory Visit: Payer: Self-pay

## 2019-08-31 ENCOUNTER — Ambulatory Visit (INDEPENDENT_AMBULATORY_CARE_PROVIDER_SITE_OTHER): Payer: 59 | Admitting: Obstetrics and Gynecology

## 2019-08-31 VITALS — BP 123/77 | HR 99 | Wt 176.0 lb

## 2019-08-31 DIAGNOSIS — O3663X0 Maternal care for excessive fetal growth, third trimester, not applicable or unspecified: Secondary | ICD-10-CM

## 2019-08-31 DIAGNOSIS — Z3A38 38 weeks gestation of pregnancy: Secondary | ICD-10-CM

## 2019-08-31 DIAGNOSIS — O43199 Other malformation of placenta, unspecified trimester: Secondary | ICD-10-CM

## 2019-08-31 DIAGNOSIS — O26899 Other specified pregnancy related conditions, unspecified trimester: Secondary | ICD-10-CM

## 2019-08-31 DIAGNOSIS — Z6791 Unspecified blood type, Rh negative: Secondary | ICD-10-CM

## 2019-08-31 DIAGNOSIS — R519 Headache, unspecified: Secondary | ICD-10-CM

## 2019-08-31 DIAGNOSIS — Z3483 Encounter for supervision of other normal pregnancy, third trimester: Secondary | ICD-10-CM

## 2019-08-31 DIAGNOSIS — O26893 Other specified pregnancy related conditions, third trimester: Secondary | ICD-10-CM

## 2019-08-31 DIAGNOSIS — R87612 Low grade squamous intraepithelial lesion on cytologic smear of cervix (LGSIL): Secondary | ICD-10-CM

## 2019-08-31 DIAGNOSIS — O43193 Other malformation of placenta, third trimester: Secondary | ICD-10-CM

## 2019-08-31 NOTE — Progress Notes (Signed)
Prenatal Visit Note Date: 08/31/2019 Clinic: [facility]  Subjective:  Gloria Dean is a 33 y.o. G3P2002 at [redacted]w[redacted]d being seen today for ongoing prenatal care.  She is currently monitored for the following issues for this low risk pregnancy and has Asthma with allergic rhinitis; LGSIL on Pap smear of cervix; Genital warts; Supervision of normal pregnancy; Rh negative state in antepartum period; Marginal insertion of umbilical cord affecting management of mother; Excessive fetal growth affecting management of mother in third trimester, antepartum; Placenta succenturiata; Carpal tunnel syndrome during pregnancy; and Pregnancy headache in third trimester on their problem list.  Patient reports no complaints.   Contractions: Not present. Vag. Bleeding: None.  Movement: Present. Denies leaking of fluid.   The following portions of the patient's history were reviewed and updated as appropriate: allergies, current medications, past family history, past medical history, past social history, past surgical history and problem list. Problem list updated.  Objective:   Vitals:   08/31/19 0913  BP: 123/77  Pulse: 99  Weight: 176 lb (79.8 kg)    Fetal Status: Fetal Heart Rate (bpm): 158   Movement: Present  Presentation: Vertex  General:  Alert, oriented and cooperative. Patient is in no acute distress.  Skin: Skin is warm and dry. No rash noted.   Cardiovascular: Normal heart rate noted  Respiratory: Normal respiratory effort, no problems with respiration noted  Abdomen: Soft, gravid, appropriate for gestational age. Pain/Pressure: Absent     Pelvic:  Cervical exam performed Dilation: 1 Effacement (%): Thick Station: Ballotable  Extremities: Normal range of motion.  Edema: Trace  Mental Status: Normal mood and affect. Normal behavior. Normal judgment and thought content.   Urinalysis:      Assessment and Plan:  Pregnancy: G3P2002 at [redacted]w[redacted]d  1. Marginal insertion of  umbilical cord affecting management of mother Normal growth on 11/18. F/u PRN   2. Excessive fetal growth affecting management of pregnancy in third trimester, single or unspecified fetus Normalized growth on last u/s  3. Placenta succenturiata in third trimester  4. Pregnancy Pt amenable to membrane stripping next visit  term labor symptoms and general obstetric precautions including but not limited to vaginal bleeding, contractions, leaking of fluid and fetal movement were reviewed in detail with the patient. Please refer to After Visit Summary for other counseling recommendations.  Return in about 1 week (around 09/07/2019) for low risk, in person.   Aletha Halim, MD

## 2019-09-08 ENCOUNTER — Encounter: Payer: Self-pay | Admitting: Family Medicine

## 2019-09-08 ENCOUNTER — Ambulatory Visit (INDEPENDENT_AMBULATORY_CARE_PROVIDER_SITE_OTHER): Payer: 59 | Admitting: Family Medicine

## 2019-09-08 ENCOUNTER — Other Ambulatory Visit: Payer: Self-pay

## 2019-09-08 VITALS — BP 119/78 | HR 72 | Wt 178.5 lb

## 2019-09-08 DIAGNOSIS — O3663X0 Maternal care for excessive fetal growth, third trimester, not applicable or unspecified: Secondary | ICD-10-CM

## 2019-09-08 DIAGNOSIS — Z3483 Encounter for supervision of other normal pregnancy, third trimester: Secondary | ICD-10-CM

## 2019-09-08 DIAGNOSIS — O43193 Other malformation of placenta, third trimester: Secondary | ICD-10-CM

## 2019-09-08 DIAGNOSIS — O360930 Maternal care for other rhesus isoimmunization, third trimester, not applicable or unspecified: Secondary | ICD-10-CM

## 2019-09-08 DIAGNOSIS — Z6791 Unspecified blood type, Rh negative: Secondary | ICD-10-CM

## 2019-09-08 DIAGNOSIS — O43199 Other malformation of placenta, unspecified trimester: Secondary | ICD-10-CM

## 2019-09-08 DIAGNOSIS — O26893 Other specified pregnancy related conditions, third trimester: Secondary | ICD-10-CM

## 2019-09-08 DIAGNOSIS — Z3A39 39 weeks gestation of pregnancy: Secondary | ICD-10-CM

## 2019-09-08 NOTE — Progress Notes (Signed)
   PRENATAL VISIT NOTE  Subjective:  Gloria Dean is a 33 y.o. G3P2002 at [redacted]w[redacted]d being seen today for ongoing prenatal care.  She is currently monitored for the following issues for this low-risk pregnancy and has Asthma with allergic rhinitis; LGSIL on Pap smear of cervix; Genital warts; Supervision of normal pregnancy; Rh negative state in antepartum period; Marginal insertion of umbilical cord affecting management of mother; Excessive fetal growth affecting management of mother in third trimester, antepartum; Placenta succenturiata; Carpal tunnel syndrome during pregnancy; and Pregnancy headache in third trimester on their problem list.  Patient reports no complaints.  Contractions: Not present. Vag. Bleeding: None.  Movement: (!) Decreased. Reported decreased this AM but then moving very normally for the past 2 hour with >10 in the past hour. Denies leaking of fluid.   The following portions of the patient's history were reviewed and updated as appropriate: allergies, current medications, past family history, past medical history, past social history, past surgical history and problem list.   Objective:   Vitals:   09/08/19 1536  BP: 119/78  Pulse: 72  Weight: 178 lb 8 oz (81 kg)    Fetal Status: Fetal Heart Rate (bpm): 141 Fundal Height: 39 cm Movement: (!) Decreased     General:  Alert, oriented and cooperative. Patient is in no acute distress.  Skin: Skin is warm and dry. No rash noted.   Cardiovascular: Normal heart rate noted  Respiratory: Normal respiratory effort, no problems with respiration noted  Abdomen: Soft, gravid, appropriate for gestational age.  Pain/Pressure: Absent     Pelvic: Cervical exam performed Dilation: 1.5 Effacement (%): Thick Station: -3  Extremities: Normal range of motion.  Edema: Trace  Mental Status: Normal mood and affect. Normal behavior. Normal judgment and thought content.   Assessment and Plan:  Pregnancy: G3P2002 at [redacted]w[redacted]d 1. Encounter for  supervision of other normal pregnancy in third trimester Counseled to monitor FM closely and if decreased consistently to go to the MAU Discussed membrane sweeping today. Reviewed cochrane review data on membrane sweeping at 39 wks and then at EDD. Reviewed risk of cramping, contractions, bleeding and ROM. Answered patient questions and she agreed to proceed with procedure. Swept membranes with good effect Up to date Will request IOL at 61 today- orders placed for admission  2. Rh negative state in antepartum period Received rhogam  3. Excessive fetal growth affecting management of pregnancy in third trimester, single or unspecified fetus LGA, TWG=43 lb 8 oz (19.7 kg)   4. Marginal insertion of umbilical cord affecting management of mother  5. Pregnancy headache in third trimester None today  Term labor symptoms and general obstetric precautions including but not limited to vaginal bleeding, contractions, leaking of fluid and fetal movement were reviewed in detail with the patient. Please refer to After Visit Summary for other counseling recommendations.   Return in about 1 week (around 09/15/2019) for Routine prenatal care, in person.  Future Appointments  Date Time Provider [place]  09/16/2019  8:00 AM Aletha Halim, MD [facility]  09/19/2019  7:30 AM [facility] [place] [facility] None  09/24/2019  9:00 AM [facility]-SCREENING [facility] None    Caren Macadam, MD

## 2019-09-12 ENCOUNTER — Inpatient Hospital Stay (HOSPITAL_COMMUNITY): Admission: AD | Admit: 2019-09-12 | Payer: 59 | Source: Home / Self Care

## 2019-09-14 ENCOUNTER — Telehealth (HOSPITAL_COMMUNITY): Payer: Self-pay | Admitting: *Deleted

## 2019-09-14 ENCOUNTER — Encounter (HOSPITAL_COMMUNITY): Payer: Self-pay | Admitting: *Deleted

## 2019-09-14 ENCOUNTER — Other Ambulatory Visit: Payer: Self-pay | Admitting: Advanced Practice Midwife

## 2019-09-14 NOTE — Telephone Encounter (Signed)
Preadmission screen  

## 2019-09-16 ENCOUNTER — Other Ambulatory Visit: Payer: Self-pay

## 2019-09-16 ENCOUNTER — Inpatient Hospital Stay (HOSPITAL_COMMUNITY)
Admission: AD | Admit: 2019-09-16 | Discharge: 2019-09-17 | DRG: 806 | Disposition: A | Discharge status: Home / Self Care | Payer: 59 | Attending: Obstetrics & Gynecology | Admitting: Obstetrics & Gynecology

## 2019-09-16 ENCOUNTER — Ambulatory Visit (INDEPENDENT_AMBULATORY_CARE_PROVIDER_SITE_OTHER): Payer: 59 | Admitting: Obstetrics and Gynecology

## 2019-09-16 ENCOUNTER — Encounter (HOSPITAL_COMMUNITY): Payer: Self-pay | Admitting: Family Medicine

## 2019-09-16 ENCOUNTER — Other Ambulatory Visit (HOSPITAL_COMMUNITY): Admission: RE | Admit: 2019-09-16 | Payer: 59 | Source: Ambulatory Visit

## 2019-09-16 VITALS — BP 123/83 | HR 82 | Wt 177.0 lb

## 2019-09-16 DIAGNOSIS — Z6791 Unspecified blood type, Rh negative: Secondary | ICD-10-CM | POA: Diagnosis not present

## 2019-09-16 DIAGNOSIS — O480 Post-term pregnancy: Principal | ICD-10-CM | POA: Diagnosis present

## 2019-09-16 DIAGNOSIS — Z3A40 40 weeks gestation of pregnancy: Secondary | ICD-10-CM | POA: Diagnosis not present

## 2019-09-16 DIAGNOSIS — Z87891 Personal history of nicotine dependence: Secondary | ICD-10-CM | POA: Diagnosis not present

## 2019-09-16 DIAGNOSIS — O43123 Velamentous insertion of umbilical cord, third trimester: Secondary | ICD-10-CM | POA: Diagnosis present

## 2019-09-16 DIAGNOSIS — O9832 Other infections with a predominantly sexual mode of transmission complicating childbirth: Secondary | ICD-10-CM | POA: Diagnosis present

## 2019-09-16 DIAGNOSIS — A630 Anogenital (venereal) warts: Secondary | ICD-10-CM | POA: Diagnosis present

## 2019-09-16 DIAGNOSIS — O43193 Other malformation of placenta, third trimester: Secondary | ICD-10-CM | POA: Diagnosis not present

## 2019-09-16 DIAGNOSIS — Z30430 Encounter for insertion of intrauterine contraceptive device: Secondary | ICD-10-CM

## 2019-09-16 DIAGNOSIS — O26893 Other specified pregnancy related conditions, third trimester: Secondary | ICD-10-CM | POA: Diagnosis present

## 2019-09-16 DIAGNOSIS — J45909 Unspecified asthma, uncomplicated: Secondary | ICD-10-CM | POA: Diagnosis present

## 2019-09-16 DIAGNOSIS — Z20828 Contact with and (suspected) exposure to other viral communicable diseases: Secondary | ICD-10-CM | POA: Diagnosis present

## 2019-09-16 DIAGNOSIS — O43199 Other malformation of placenta, unspecified trimester: Secondary | ICD-10-CM | POA: Diagnosis present

## 2019-09-16 DIAGNOSIS — O43893 Other placental disorders, third trimester: Secondary | ICD-10-CM | POA: Diagnosis not present

## 2019-09-16 DIAGNOSIS — O9952 Diseases of the respiratory system complicating childbirth: Secondary | ICD-10-CM | POA: Diagnosis present

## 2019-09-16 DIAGNOSIS — Z3483 Encounter for supervision of other normal pregnancy, third trimester: Secondary | ICD-10-CM

## 2019-09-16 LAB — RESPIRATORY PANEL BY RT PCR (FLU A&B, COVID)
Influenza A by PCR: NEGATIVE
Influenza B by PCR: NEGATIVE
SARS Coronavirus 2 by RT PCR: NEGATIVE

## 2019-09-16 LAB — CBC
HCT: 35.3 % — ABNORMAL LOW (ref 36.0–46.0)
Hemoglobin: 12 g/dL (ref 12.0–15.0)
MCH: 30.1 pg (ref 26.0–34.0)
MCHC: 34 g/dL (ref 30.0–36.0)
MCV: 88.5 fL (ref 80.0–100.0)
Platelets: 229 10*3/uL (ref 150–400)
RBC: 3.99 MIL/uL (ref 3.87–5.11)
RDW: 14 % (ref 11.5–15.5)
WBC: 14.8 10*3/uL — ABNORMAL HIGH (ref 4.0–10.5)
nRBC: 0 % (ref 0.0–0.2)

## 2019-09-16 MED ORDER — SENNOSIDES-DOCUSATE SODIUM 8.6-50 MG PO TABS
2.0000 | ORAL_TABLET | ORAL | Status: DC
  Administered 2019-09-16: 2 via ORAL
  Filled 2019-09-16: qty 2

## 2019-09-16 MED ORDER — BENZOCAINE-MENTHOL 20-0.5 % EX AERO: 1.0000 "application " | INHALATION_SPRAY | CUTANEOUS | Status: DC | PRN

## 2019-09-16 MED ORDER — OXYTOCIN 40 UNITS IN NORMAL SALINE INFUSION - SIMPLE MED
2.5000 [IU]/h | INTRAVENOUS | Status: DC
  Filled 2019-09-16: qty 1000

## 2019-09-16 MED ORDER — OXYTOCIN BOLUS FROM INFUSION
500.0000 mL | Freq: Once | INTRAVENOUS | Status: AC
  Administered 2019-09-16: 500 mL via INTRAVENOUS

## 2019-09-16 MED ORDER — LACTATED RINGERS IV SOLN
INTRAVENOUS | Status: DC
  Administered 2019-09-16: 12:00:00 via INTRAVENOUS

## 2019-09-16 MED ORDER — OXYCODONE-ACETAMINOPHEN 5-325 MG PO TABS: 2.0000 | ORAL_TABLET | ORAL | Status: DC | PRN

## 2019-09-16 MED ORDER — LACTATED RINGERS IV SOLN: 500.0000 mL | INTRAVENOUS | Status: DC | PRN

## 2019-09-16 MED ORDER — ACETAMINOPHEN 325 MG PO TABS: 650.0000 mg | ORAL_TABLET | ORAL | Status: DC | PRN

## 2019-09-16 MED ORDER — PRENATAL MULTIVITAMIN CH
1.0000 | ORAL_TABLET | Freq: Every day | ORAL | Status: DC
  Administered 2019-09-17: 1 via ORAL
  Filled 2019-09-16: qty 1

## 2019-09-16 MED ORDER — DIBUCAINE (PERIANAL) 1 % EX OINT: 1.0000 "application " | TOPICAL_OINTMENT | CUTANEOUS | Status: DC | PRN

## 2019-09-16 MED ORDER — SIMETHICONE 80 MG PO CHEW: 80.0000 mg | CHEWABLE_TABLET | ORAL | Status: DC | PRN

## 2019-09-16 MED ORDER — LIDOCAINE HCL (PF) 1 % IJ SOLN
30.0000 mL | INTRAMUSCULAR | Status: AC | PRN
  Administered 2019-09-16: 30 mL via SUBCUTANEOUS
  Filled 2019-09-16: qty 30

## 2019-09-16 MED ORDER — TERBUTALINE SULFATE 1 MG/ML IJ SOLN: 0.2500 mg | Freq: Once | INTRAMUSCULAR | Status: DC | PRN

## 2019-09-16 MED ORDER — OXYCODONE-ACETAMINOPHEN 5-325 MG PO TABS: 1.0000 | ORAL_TABLET | ORAL | Status: DC | PRN

## 2019-09-16 MED ORDER — LEVONORGESTREL 19.5 MCG/DAY IU IUD
INTRAUTERINE_SYSTEM | INTRAUTERINE | Status: AC
  Administered 2019-09-16: 1 via INTRAUTERINE
  Filled 2019-09-16: qty 1

## 2019-09-16 MED ORDER — DIPHENHYDRAMINE HCL 25 MG PO CAPS: 25.0000 mg | ORAL_CAPSULE | Freq: Four times a day (QID) | ORAL | Status: DC | PRN

## 2019-09-16 MED ORDER — WITCH HAZEL-GLYCERIN EX PADS: 1.0000 "application " | MEDICATED_PAD | CUTANEOUS | Status: DC | PRN

## 2019-09-16 MED ORDER — SOD CITRATE-CITRIC ACID 500-334 MG/5ML PO SOLN: 30.0000 mL | ORAL | Status: DC | PRN

## 2019-09-16 MED ORDER — ONDANSETRON HCL 4 MG/2ML IJ SOLN: 4.0000 mg | Freq: Four times a day (QID) | INTRAMUSCULAR | Status: DC | PRN

## 2019-09-16 MED ORDER — ONDANSETRON HCL 4 MG PO TABS: 4.0000 mg | ORAL_TABLET | ORAL | Status: DC | PRN

## 2019-09-16 MED ORDER — LEVONORGESTREL 19.5 MCG/DAY IU IUD: INTRAUTERINE_SYSTEM | Freq: Once | INTRAUTERINE | Status: AC

## 2019-09-16 MED ORDER — COCONUT OIL OIL: 1.0000 "application " | TOPICAL_OIL | Status: DC | PRN

## 2019-09-16 MED ORDER — ONDANSETRON HCL 4 MG/2ML IJ SOLN: 4.0000 mg | INTRAMUSCULAR | Status: DC | PRN

## 2019-09-16 MED ORDER — IBUPROFEN 600 MG PO TABS
600.0000 mg | ORAL_TABLET | Freq: Four times a day (QID) | ORAL | Status: DC
  Administered 2019-09-16 – 2019-09-17 (×5): 600 mg via ORAL
  Filled 2019-09-16 (×5): qty 1

## 2019-09-16 NOTE — Progress Notes (Signed)
Prenatal Visit Note Date: 09/16/2019 Clinic: [facility]  Subjective:  Gloria Dean is a 33 y.o. G3P2002 at [redacted]w[redacted]d being seen today for ongoing prenatal care.  She is currently monitored for the following issues for this low-risk pregnancy and has Asthma with allergic rhinitis; LGSIL on Pap smear of cervix; Genital warts; Supervision of normal pregnancy; Rh negative state in antepartum period; Marginal insertion of umbilical cord affecting management of mother; Excessive fetal growth affecting management of mother in third trimester, antepartum; Placenta succenturiata; Carpal tunnel syndrome during pregnancy; and Pregnancy headache in third trimester on their problem list.  Patient reports feeling q78m UCs this morning.   Contractions: Irregular. Vag. Bleeding: None.  Movement: Present. Denies leaking of fluid.   The following portions of the patient's history were reviewed and updated as appropriate: allergies, current medications, past family history, past medical history, past social history, past surgical history and problem list. Problem list updated.  Objective:   Vitals:   09/16/19 0814  BP: 123/83  Pulse: 82  Weight: 177 lb (80.3 kg)    Fetal Status: Fetal Heart Rate (bpm): rNST   Movement: Present  Presentation: Vertex  General:  Alert, oriented and cooperative. Patient is in no acute distress.  Skin: Skin is warm and dry. No rash noted.   Cardiovascular: Normal heart rate noted  Respiratory: Normal respiratory effort, no problems with respiration noted  Abdomen: Soft, gravid, appropriate for gestational age. Pain/Pressure: Present     Pelvic:  Cervical exam performed Dilation: 3.5 Effacement (%): 50 Station: -2  Extremities: Normal range of motion.  Edema: Trace  Mental Status: Normal mood and affect. Normal behavior. Normal judgment and thought content.   Urinalysis:      Assessment and Plan:  Pregnancy: G3P2002 at [redacted]w[redacted]d  1. Encounter for  supervision of other normal pregnancy in third trimester Reactive NST (140 baseline, +accels, one small variable decel at the end, mod variability, toco q72m x 49m) BPP performed and AFI 8, cephalic, no breathing, one tone and one movement. Full 1m not done but given favorable cervix, multip status and over 40wks, I recommend IOL today and pt amenable to plan. L&D aware GBS neg  2. Placenta succenturiata in third trimester Inspect at delivery  3. Rh negative state in antepartum period Rhogam PP PRN  4. Marginal insertion of umbilical cord affecting management of mother Careful to avoid avulsion with 3rd stage  Preterm labor symptoms and general obstetric precautions including but not limited to vaginal bleeding, contractions, leaking of fluid and fetal movement were reviewed in detail with the patient. Please refer to After Visit Summary for other counseling recommendations.  Return in about 1 month (around 10/17/2019) for in person pp visit.   Aletha Halim, MD

## 2019-09-16 NOTE — H&P (Addendum)
OBSTETRIC ADMISSION HISTORY AND PHYSICAL  Gloria Dean is a 33 y.o. female G3P2002 with IUP at [redacted]w[redacted]d presenting for IOL for BPP 6/8. She reports +FMs. No LOF, VB, blurry vision, headaches, peripheral edema, or RUQ pain. She plans on breast feeding. She requests PP IUD for birth control.   Dating: By U/S --->  Estimated Date of Delivery: 09/12/19 Sono: 08/26/2019  @[redacted]w[redacted]d , CWD, normal anatomy and anterior placenta with succenturiate lobe and marginal insertion of placental cord, cephalic presentation, 5176H, 78 %ile  Prenatal History/Complications: LGSIL Genital Warts Rh Negative Excesssive fetal growth in third semester Placenta Succenturiata Carpal Tunnel Syndrome of Pregnancy Previous preeclampsia at 28 weeks in first pregnancy   Past Medical History: Past Medical History:  Diagnosis Date  . Abnormal Pap smear 2010  . Asthma   . Infection    chylamidia  . Preeclampsia 2113    Past Surgical History: Past Surgical History:  Procedure Laterality Date  . WISDOM TOOTH EXTRACTION      Obstetrical History: OB History    Gravida  3   Para  2   Term  2   Preterm  0   AB  0   Living  2     SAB  0   TAB  0   Ectopic  0   Multiple  0   Live Births  2           Social History: Social History   Socioeconomic History  . Marital status: Married    Spouse name: Not on file  . Number of children: Not on file  . Years of education: Not on file  . Highest education level: Not on file  Occupational History  . Not on file  Tobacco Use  . Smoking status: Former Smoker    Types: Cigarettes    Quit date: 07/09/2011    Years since quitting: 8.1  . Smokeless tobacco: Never Used  . Tobacco comment: socially  Substance and Sexual Activity  . Alcohol use: Yes    Comment: social, not while pregnancy  . Drug use: No  . Sexual activity: Yes    Partners: Male  Other Topics Concern  . Not on file  Social History Narrative  . Not on file   Social  Determinants of Health   Financial Resource Strain:   . Difficulty of Paying Living Expenses: Not on file  Food Insecurity:   . Worried About Charity fundraiser in the Last Year: Not on file  . Ran Out of Food in the Last Year: Not on file  Transportation Needs:   . Lack of Transportation (Medical): Not on file  . Lack of Transportation (Non-Medical): Not on file  Physical Activity:   . Days of Exercise per Week: Not on file  . Minutes of Exercise per Session: Not on file  Stress:   . Feeling of Stress : Not on file  Social Connections:   . Frequency of Communication with Friends and Family: Not on file  . Frequency of Social Gatherings with Friends and Family: Not on file  . Attends Religious Services: Not on file  . Active Member of Clubs or Organizations: Not on file  . Attends Archivist Meetings: Not on file  . Marital Status: Not on file    Family History: Family History  Problem Relation Age of Onset  . Cancer Maternal Grandfather        lung    Allergies: No Known Allergies  Medications Prior  to Admission  Medication Sig Dispense Refill Last Dose  . albuterol (PROVENTIL HFA;VENTOLIN HFA) 108 (90 Base) MCG/ACT inhaler Inhale 2 puffs into the lungs every 6 (six) hours as needed for shortness of breath. (Patient not taking: Reported on 08/19/2019) 1 Inhaler 0   . Blood Pressure Monitoring (B-D ASSURE BPM/MANUAL ARM CUFF) MISC 1 Device by Does not apply route once a week. To be monitored regularly at home. 1 each 0   . cetirizine (ZYRTEC) 10 MG tablet Take 10 mg by mouth daily as needed. For allergies     . Magnesium Malate POWD EVERY DAY AS NEEDED (Patient not taking: Reported on 08/19/2019) 30 g 0   . Prenatal Vit-Fe Fumarate-FA (PRENATAL MULTIVITAMIN) TABS tablet Take 1 tablet by mouth daily at 12 noon.     . triamcinolone cream (KENALOG) 0.1 %         Review of Systems   All systems reviewed and negative except as stated in HPI  Last menstrual  period 12/06/2018. General appearance: alert, cooperative and appears stated age Lungs: regular rate and effort Heart: regular rate  Abdomen: soft, non-tender Extremities: Homans sign is negative, no sign of DVT Presentation: cephalic Fetal monitoringBaseline: 145-150 bpm, Variability: Good {> 6 bpm) and Accelerations: Reactive Uterine activity Moderate on Toco     Prenatal labs: ABO, Rh: AB/Negative/-- (05/26 1551) Antibody: Negative (09/09 0838) Rubella: 4.89 (05/26 1551) RPR: Non Reactive (09/09 0838)  HBsAg: Negative (05/26 1551)  HIV: Non Reactive (09/09 1601)  GBS: --Theda Sers (11/12 1557)  2 hr GTT 88/132/118  Prenatal Transfer Tool  Maternal Diabetes: No Genetic Screening: Normal Maternal Ultrasounds/Referrals: Normal Fetal Ultrasounds or other Referrals:  None Maternal Substance Abuse:  No Significant Maternal Medications:  None Significant Maternal Lab Results: Rh negative  No results found for this or any previous visit (from the past 24 hour(s)).  Patient Active Problem List   Diagnosis Date Noted  . Placenta succenturiata 08/05/2019  . Carpal tunnel syndrome during pregnancy 08/05/2019  . Pregnancy headache in third trimester 08/05/2019  . Marginal insertion of umbilical cord affecting management of mother 07/01/2019  . Excessive fetal growth affecting management of mother in third trimester, antepartum 07/01/2019  . Rh negative state in antepartum period 05/08/2013  . Supervision of normal pregnancy 05/07/2013  . Genital warts 12/17/2011  . LGSIL on Pap smear of cervix 09/05/2011  . Asthma with allergic rhinitis 08/07/2011    Assessment: Gloria Dean is a 32 y.o. G3P2002 at [redacted]w[redacted]d here for IOL   1. Labor: Expectant management.  Consider Pitocin at next check if no cervical change. AROM as appropriate 2. FWB: Cat I 3. Pain: IV meds, Epidural at patients request 4. GBS: Negative 5. Rh Negative: Rhogam evaluation post partum 6. H/O PreEclampsia:   Normotensive this pregnancy, continue to monitor BP, PEC labs as appropriate   Plan: Admit to L&D FHM Regular Diet Anticipate Vaginal Delivery  Dana Allan, MD  09/16/2019, 10:59 AM  I saw and evaluated the patient. I agree with the findings and the plan of care as documented in the resident's note. EFW 3700g. Shared-decision making. Expectant management; will check in 2 hours and start Pit if not progressing. Succenturiate lobe; evaluate post-partum for intact placenta. Hx of Pre-E; monitor BP's closely.   Jerilynn Birkenhead, MD [facility] Family Medicine Fellow, [facility] for [employer], [facility]

## 2019-09-16 NOTE — Progress Notes (Signed)
Gloria Dean is a 34 y.o. G3P2002 at [redacted]w[redacted]d admitted for active labor  Subjective: Feeling fetal movement.  No LOF.  Objective: BP 135/75   Pulse 77   Temp 98 F (36.7 C) (Oral)   Resp 16   Ht 5\' 3"  (1.6 m)   Wt 80.3 kg   LMP 12/06/2018   BMI 31.35 kg/m  No intake/output data recorded.  FHT:  FHR: 140 bpm, variability: moderate,  accelerations:  Present,  decelerations:  Absent UC: Moderate on Toco  SVE:   Dilation: 7 Effacement (%): 70 Station: -2 Exam by:: Volanda Napoleon, MD  Labs: Lab Results  Component Value Date   WBC 14.8 (H) 09/16/2019   HGB 12.0 09/16/2019   HCT 35.3 (L) 09/16/2019   MCV 88.5 09/16/2019   PLT 229 09/16/2019    Assessment / Plan: Gloria Dean is a 33 y.o G3P2002 at [redacted]w[redacted]d here for Active Labor  Labor: Progressing normally Fetal Wellbeing:  Category I Pain Control:  Epidural and IV pain meds at patients request H/O Pre-eclampsia: Monitor BP closely I/D:  GBS negative Rh Negative: Rhogam evaluation post partum  Anticipated MOD:  Vaginal Delivery  Carollee Leitz  MD PGY1 Family Med Practice 09/16/2019, 2:33 PM

## 2019-09-16 NOTE — Discharge Summary (Signed)
Postpartum Discharge Summary     Patient Name: Gloria Dean DOB: 1986/09/03 MRN: [id number]  Date of admission: 09/16/2019 Delivering Provider: Carollee Leitz   Date of discharge: 09/17/2019  Admitting diagnosis: Post term pregnancy [O48.0] Intrauterine pregnancy: [redacted]w[redacted]d    Secondary diagnosis:  Active Problems:   Rh negative state in antepartum period   Marginal insertion of umbilical cord affecting management of mother   [redacted] weeks gestation of pregnancy   Post term pregnancy  Additional problems: None     Discharge diagnosis: Term Pregnancy Delivered                                                                                                Post partum procedures:Post-placental IUD insertion  Augmentation: AROM  Complications: None  Hospital course:  Onset of Labor With Vaginal Delivery     33y.o. yo [id number] admitted in Latent Labor on 09/16/2019. Patient had an uncomplicated labor course as follows: Initial SVE: 5/50/-2. AROM at 1651 for meconium stain fluid and achieved completed cervical dilation at 1700. No further augmentation required and progressed to spontaneous vaginal delivery without complication. Membrane Rupture Time/Date: 4:51 PM ,09/16/2019   Intrapartum Procedures: Episiotomy: None [1]                                         Lacerations:  Labial [10]  Patient had a delivery of a Viable infant. 09/16/2019  Information for the patient's newborn:  GoAalaysia, Liggins0[[id number]]Delivery Method: Vaginal, Spontaneous(Filed from Delivery Summary)     Pateint had an uncomplicated postpartum course.  She is ambulating, tolerating a regular diet, passing flatus, and urinating well. Patient is discharged home in stable condition on 09/17/19.  Delivery time: 5:04 PM    Magnesium Sulfate received: No BMZ received: No Rhophylac:Yes MMR:No Transfusion:No  Physical exam  Vitals:   09/16/19 1841 09/16/19 1943 09/16/19 2330 09/17/19 0500   BP: 119/75 129/80 122/67 122/86  Pulse: 82 79 95 82  Resp: _0 Temp: 98.6 F (37 C) 98.1 F (36.7 C) 97.9 F (36.6 C) 97.9 F (36.6 C)  TempSrc: Axillary Oral Oral Oral  SpO2: 100% 100% 98% 100%  Weight:      Height:       General: alert, cooperative and no distress Lochia: appropriate Uterine Fundus: firm Incision: N/A DVT Evaluation: No evidence of DVT seen on physical exam. No significant calf/ankle edema. Labs: Lab Results  Component Value Date   WBC 14.8 (H) 09/16/2019   HGB 12.0 09/16/2019   HCT 35.3 (L) 09/16/2019   MCV 88.5 09/16/2019   PLT 229 09/16/2019   CMP Latest Ref Rng & Units 06/03/2017  Glucose 65 - 99 mg/dL 74  BUN 6 - 20 mg/dL 8  Creatinine 0.57 - 1.00 mg/dL 0.75  Sodium 134 - 144 mmol/L 140  Potassium 3.5 - 5.2 mmol/L 4.2  Chloride 96 - 106 mmol/L 101  CO2 20 - 29 mmol/L 24  Calcium 8.7 -  10.2 mg/dL 9.4  Total Protein 6.0 - 8.5 g/dL 7.4  Total Bilirubin 0.0 - 1.2 mg/dL 0.3  Alkaline Phos 39 - 117 IU/L 51  AST 0 - 40 IU/L 19  ALT 0 - 32 IU/L 15    Discharge instruction: per After Visit Summary and "Baby and Me Booklet".  After visit meds:  Allergies as of 09/17/2019   No Known Allergies     Medication List    TAKE these medications   acetaminophen 325 MG tablet Commonly known as: Tylenol Take 2 tablets (650 mg total) by mouth every 4 (four) hours as needed (for pain scale < 4).   albuterol 108 (90 Base) MCG/ACT inhaler Commonly known as: VENTOLIN HFA Inhale 2 puffs into the lungs every 6 (six) hours as needed for shortness of breath.   B-D ASSURE BPM/MANUAL ARM CUFF Misc 1 Device by Does not apply route once a week. To be monitored regularly at home.   cetirizine 10 MG tablet Commonly known as: ZYRTEC Take 10 mg by mouth daily as needed. For allergies   ibuprofen 600 MG tablet Commonly known as: ADVIL Take 1 tablet (600 mg total) by mouth every 6 (six) hours.   Magnesium Malate Powd EVERY DAY AS NEEDED    polyethylene glycol powder 17 GM/SCOOP powder Commonly known as: GLYCOLAX/MIRALAX Take 255 g by mouth once for 1 dose.   prenatal multivitamin Tabs tablet Take 1 tablet by mouth daily at 12 noon.   triamcinolone cream 0.1 % Commonly known as: KENALOG       Diet: routine diet  Activity: Advance as tolerated. Pelvic rest for 6 weeks.   Outpatient follow up:4 weeks Follow up Appt: Future Appointments  Date Time Provider [place]  10/21/2019  2:00 PM Anyanwu, Sallyanne Havers, MD [facility]   Follow up Visit:   Please schedule this patient for Postpartum visit in: 4 weeks with the following provider: Any provider Low risk pregnancy complicated by: none Delivery mode:  SVD Anticipated Birth Control:  IUD PP Procedures needed: None  Schedule Integrated [place] visit: no   Newborn Data: Live born female  Birth Weight: 3779 grams  APGAR: 8, 9  Newborn Delivery   Birth date/time: 09/16/2019 17:04:00 Delivery type: Vaginal, Spontaneous      Baby Feeding: Breast Disposition:home with mother   09/17/2019 Clarnce Flock, MD

## 2019-09-16 NOTE — MAU Note (Signed)
Covid swab collected. Pt is asymptomatic

## 2019-09-17 ENCOUNTER — Other Ambulatory Visit (HOSPITAL_COMMUNITY): Payer: 59

## 2019-09-17 ENCOUNTER — Encounter (HOSPITAL_COMMUNITY): Payer: Self-pay | Admitting: Family Medicine

## 2019-09-17 LAB — RPR: RPR Ser Ql: NONREACTIVE

## 2019-09-17 MED ORDER — IBUPROFEN 600 MG PO TABS: 600.0000 mg | ORAL_TABLET | Freq: Four times a day (QID) | ORAL | 0 refills | Status: DC

## 2019-09-17 MED ORDER — POLYETHYLENE GLYCOL 3350 17 GM/SCOOP PO POWD: 1.0000 | Freq: Once | ORAL | 0 refills | Status: AC

## 2019-09-17 MED ORDER — RHO D IMMUNE GLOBULIN 1500 UNIT/2ML IJ SOSY
300.0000 ug | PREFILLED_SYRINGE | Freq: Once | INTRAMUSCULAR | Status: AC
  Administered 2019-09-17: 300 ug via INTRAVENOUS
  Filled 2019-09-17: qty 2

## 2019-09-17 MED ORDER — ACETAMINOPHEN 325 MG PO TABS: 650.0000 mg | ORAL_TABLET | ORAL | 0 refills | Status: DC | PRN

## 2019-09-17 NOTE — Progress Notes (Signed)
POSTPARTUM PROGRESS NOTE  Post Partum Day 1  Subjective:  Gloria Dean is a 33 y.o. G3P3003 s/p NSVD at [redacted]w[redacted]d.  She reports she is doing well. No acute events overnight. She denies any problems with ambulating, voiding or po intake. Denies nausea or vomiting.  Pain is well controlled.  Lochia is appropriate.  Objective: Blood pressure 122/86, pulse 82, temperature 97.9 F (36.6 C), temperature source Oral, resp. rate 17, height 5\' 3"  (1.6 m), weight 80.3 kg, last menstrual period 12/06/2018, SpO2 100 %, unknown if currently breastfeeding.  Physical Exam:  General: alert, cooperative and no distress Chest: no respiratory distress Heart:regular rate, distal pulses intact Abdomen: soft, nontender,  Uterine Fundus: firm, appropriately tender DVT Evaluation: No calf swelling or tenderness Extremities: no LE edema Skin: warm, dry  Recent Labs    09/16/19 1140  HGB 12.0  HCT 35.3*    Assessment/Plan: Gloria Dean is a 33 y.o. G3P3003 s/p NSVD at [redacted]w[redacted]d   PPD#1 - Doing well  Routine postpartum care Rh negative: infant Rh+, rhogam ordered Contraception: PP IUD placed Feeding: breast Dispo: Plan for discharge today if infant is discharged, if not then tomorrow.  Circ: patient desires circ for infant   LOS: 1 day   Augustin Coupe, MD/[facility] OB Fellow  09/17/2019, 7:09 AM

## 2019-09-17 NOTE — Lactation Note (Signed)
This note was copied from a baby's chart. Lactation Consultation Note  Patient Name: Gloria Dean [id number] Date: 09/17/2019 Reason for consult: Initial assessment;Term  P3 mother whose infant is now 83 hours old.  Mother breast feed her other two children (now 52 and 34 years old) for 6 months each.  Baby was asleep on mother's chest when I arrived.  Mother had no questions/concerns related to breast feeding.  She is experienced and seems quite knowledgeable.  Encouraged to feed 8-12 times/24 hours or sooner if baby shows feeding cues.  Mother did not wish to review hand expression or feeding cues.  Colostrum container provided for any EBM she obtains with hand expression.  Encouraged to finger feed any EBM back to baby.  Mother will call for latch assistance if needed.  She has a DEBP for home use.  Mom made aware of O/P services, breastfeeding support groups, community resources, and our phone # for post-discharge questions.  Father present.   Maternal Data Formula Feeding for Exclusion: No Has patient been taught Hand Expression?: Yes Does the patient have breastfeeding experience prior to this delivery?: Yes  Feeding    LATCH Score                   Interventions    Lactation Tools Discussed/Used     Consult Status Consult Status: Follow-up Date: 09/18/19 Follow-up type: In-patient    Trecia Maring R Marshe Shrestha 09/17/2019, 11:52 AM

## 2019-09-17 NOTE — Discharge Instructions (Signed)

## 2019-09-18 LAB — BPAM RBC
Blood Product Expiration Date: 202012202359
Blood Product Expiration Date: 202012202359
ISSUE DATE / TIME: 202012030743
ISSUE DATE / TIME: 202012030743
Unit Type and Rh: 600
Unit Type and Rh: 600

## 2019-09-18 LAB — RH IG WORKUP (INCLUDES ABO/RH)
ABO/RH(D): AB NEG
Fetal Screen: NEGATIVE
Gestational Age(Wks): 40
Unit division: 0

## 2019-09-18 LAB — TYPE AND SCREEN
ABO/RH(D): AB NEG
Antibody Screen: POSITIVE
Unit division: 0
Unit division: 0

## 2019-09-19 ENCOUNTER — Inpatient Hospital Stay (HOSPITAL_COMMUNITY): Payer: 59

## 2019-09-20 LAB — SURGICAL PATHOLOGY

## 2019-09-24 ENCOUNTER — Other Ambulatory Visit (HOSPITAL_COMMUNITY): Payer: 59

## 2019-10-21 ENCOUNTER — Ambulatory Visit (INDEPENDENT_AMBULATORY_CARE_PROVIDER_SITE_OTHER): Payer: 59 | Admitting: Obstetrics & Gynecology

## 2019-10-21 ENCOUNTER — Other Ambulatory Visit: Payer: Self-pay

## 2019-10-21 ENCOUNTER — Encounter: Payer: Self-pay | Admitting: Obstetrics & Gynecology

## 2019-10-21 DIAGNOSIS — Z30431 Encounter for routine checking of intrauterine contraceptive device: Secondary | ICD-10-CM

## 2019-10-21 DIAGNOSIS — Z1389 Encounter for screening for other disorder: Secondary | ICD-10-CM

## 2019-10-21 DIAGNOSIS — T8332XA Displacement of intrauterine contraceptive device, initial encounter: Secondary | ICD-10-CM

## 2019-10-21 NOTE — Progress Notes (Signed)
    Post Partum Exam Gloria Dean is a 34 y.o. G3P3003 female who presents for a postpartum visit. She is 5 weeks postpartum following a spontaneous vaginal delivery. I have fully reviewed the prenatal and intrapartum course. The delivery was at 40.4 gestational weeks.  Anesthesia: none. Postpartum course has been uncomplicated. Baby's course has been uncomplicated. Baby is feeding by breast.  Bleeding staining only. Bowel function is normal. Bladder function is normal. Patient is not sexually active. Contraception method is IUD, this was placed post placentally. Postpartum depression screening:neg  The following portions of the patient's history were reviewed and updated as appropriate: allergies, current medications, past family history, past medical history, past social history, past surgical history and problem list. Last pap smear done 06/23/2018  and was Normal  Review of Systems Pertinent items noted in HPI and remainder of comprehensive ROS otherwise negative.    Objective:  Blood pressure 126/84, pulse 65, weight 160 lb (72.6 kg), last menstrual period 12/06/2018, unknown if currently breastfeeding.  General:  alert and no distress   Breasts:  inspection negative, no nipple discharge or bleeding, no masses or nodularity palpable  Lungs: clear to auscultation bilaterally  Heart:  regular rate and rhythm  Abdomen: soft, non-tender; bowel sounds normal; no masses,  no organomegaly   Vulva:  normal  Vagina: normal vagina, no discharge, exudate, lesion, or erythema  Cervix:  multiparous appearance, IUD strings not visualized  Corpus: normal size, contour, position, consistency, mobility, non-tender  Adnexa:  not evaluated  Rectal Exam: Not performed.    Bedside transabdominal scan:  IUD seen in uterus, but images are suboptimal  Assessment:   Normal postpartum exam. Pap smear not done at today's visit.   Plan:   1. IUD check up 2. Intrauterine contraceptive device threads  lost, initial encounter IUD likely in place but will confirm on formal scan. Patient to have pelvic rest or use back up contraception until then. Once confirmed, IUD can be in place for up to seven years. - [place] GYN Pelvis Complete with Transvaginal; Future  3. Postpartum care following vaginal delivery No other concerns.   Jaynie Collins, MD, FACOG Obstetrician & Gynecologist, [facility] for [employer], [facility]

## 2019-10-28 ENCOUNTER — Ambulatory Visit (HOSPITAL_COMMUNITY): Payer: 59

## 2020-05-17 ENCOUNTER — Inpatient Hospital Stay (HOSPITAL_COMMUNITY)
Admission: AD | Admit: 2020-05-17 | Discharge: 2020-05-17 | Disposition: A | Discharge status: Home / Self Care | Payer: 59 | Attending: Obstetrics and Gynecology | Admitting: Obstetrics and Gynecology

## 2020-05-17 ENCOUNTER — Other Ambulatory Visit: Payer: Self-pay

## 2020-05-17 DIAGNOSIS — Z975 Presence of (intrauterine) contraceptive device: Secondary | ICD-10-CM | POA: Diagnosis not present

## 2020-05-17 DIAGNOSIS — Z3202 Encounter for pregnancy test, result negative: Secondary | ICD-10-CM | POA: Diagnosis not present

## 2020-05-17 LAB — HCG, QUANTITATIVE, PREGNANCY: hCG, Beta Chain, Quant, S: 15 m[IU]/mL — ABNORMAL HIGH (ref <5)

## 2020-05-17 LAB — POCT PREGNANCY, URINE: Preg Test, Ur: NEGATIVE

## 2020-05-17 NOTE — MAU Provider Note (Signed)
S [name] is a 34 y.o. [id number] @[redacted]w[redacted]d  by LMP who presents to MAU today with complaint of spotting and +HPT. She has an IUD in place.   O BP 130/81 (BP Location: Right Arm)   Pulse 99   Temp 98.1 F (36.7 C) (Oral)   Resp 18   Ht 5\' 3"  (1.6 m)   Wt 65.8 kg   LMP 04/06/2020   SpO2 98%   Breastfeeding Yes   BMI 25.69 kg/m  Physical Exam Vitals and nursing note reviewed. Exam conducted with a chaperone present.  Constitutional:      General: She is not in acute distress.    Appearance: Normal appearance.  HENT:     Head: Normocephalic.  Pulmonary:     Effort: Pulmonary effort is normal. No respiratory distress.  Neurological:     General: No focal deficit present.     Mental Status: She is alert and oriented to person, place, and time.  Psychiatric:        Mood and Affect: Mood normal.    Results for orders placed or performed during the hospital encounter of 05/17/20 (from the past 24 hour(s))  Pregnancy, urine POC     Status: None   Collection Time: 05/17/20  2:13 PM  Result Value Ref Range   Preg Test, Ur NEGATIVE NEGATIVE  hCG, quantitative, pregnancy     Status: Abnormal   Collection Time: 05/17/20  2:34 PM  Result Value Ref Range   hCG, Beta Chain, Quant, S 15 (H) <5 mIU/mL   MDM: Chart reviewed: on last office visit IUD strings not seen but IUD seen in correct position on bedside 07/17/20, formal 07/17/20 ordered to confirm but pt didn't complete d/t cost. UPT negative today, quant HCG ordered. Labs results reviewed. Consult with Dr. [place], will repeat qhcg in 2 days. 1620: Notified pt via telephone of results. Recommend f/u quant in 2 days at office, will schedule. All questions answered.   A 1. Negative pregnancy test    P Discharge from MAU in stable condition Patient may return to MAU as needed for pregnancy related complaints Follow up at [facility] on 8/13 @0830   Allergies as of 05/17/2020   No Known Allergies     Medication List    STOP taking these  medications   ibuprofen 600 MG tablet Commonly known as: ADVIL   Magnesium Malate Powd     TAKE these medications   acetaminophen 325 MG tablet Commonly known as: Tylenol Take 2 tablets (650 mg total) by mouth every 4 (four) hours as needed (for pain scale < 4).   albuterol 108 (90 Base) MCG/ACT inhaler Commonly known as: VENTOLIN HFA Inhale 2 puffs into the lungs every 6 (six) hours as needed for shortness of breath.   B-D ASSURE BPM/MANUAL ARM CUFF Misc 1 Device by Does not apply route once a week. To be monitored regularly at home.   cetirizine 10 MG tablet Commonly known as: ZYRTEC Take 10 mg by mouth daily as needed. For allergies   prenatal multivitamin Tabs tablet Take 1 tablet by mouth daily at 12 noon.   triamcinolone cream 0.1 % Commonly known as: 9/13, CNM 05/17/2020 4:26 PM

## 2020-05-17 NOTE — MAU Note (Signed)
Presents with c/o slight spotting and cramping that began today.  Reports spotting with wiping.  States had +UPT and has IUD.

## 2020-05-18 ENCOUNTER — Telehealth: Payer: Self-pay | Admitting: *Deleted

## 2020-05-18 ENCOUNTER — Encounter (HOSPITAL_COMMUNITY): Payer: Self-pay | Admitting: Obstetrics and Gynecology

## 2020-05-18 ENCOUNTER — Inpatient Hospital Stay (HOSPITAL_COMMUNITY)
Admission: AD | Admit: 2020-05-18 | Discharge: 2020-05-18 | Disposition: A | Discharge status: Home / Self Care | Payer: 59 | Attending: Obstetrics and Gynecology | Admitting: Obstetrics and Gynecology

## 2020-05-18 DIAGNOSIS — Z3A01 Less than 8 weeks gestation of pregnancy: Secondary | ICD-10-CM | POA: Diagnosis not present

## 2020-05-18 DIAGNOSIS — Z975 Presence of (intrauterine) contraceptive device: Secondary | ICD-10-CM | POA: Insufficient documentation

## 2020-05-18 DIAGNOSIS — Z79899 Other long term (current) drug therapy: Secondary | ICD-10-CM | POA: Insufficient documentation

## 2020-05-18 DIAGNOSIS — O09291 Supervision of pregnancy with other poor reproductive or obstetric history, first trimester: Secondary | ICD-10-CM | POA: Diagnosis not present

## 2020-05-18 DIAGNOSIS — Z87891 Personal history of nicotine dependence: Secondary | ICD-10-CM | POA: Diagnosis not present

## 2020-05-18 DIAGNOSIS — J45909 Unspecified asthma, uncomplicated: Secondary | ICD-10-CM | POA: Diagnosis not present

## 2020-05-18 DIAGNOSIS — O26891 Other specified pregnancy related conditions, first trimester: Secondary | ICD-10-CM | POA: Diagnosis not present

## 2020-05-18 DIAGNOSIS — O99511 Diseases of the respiratory system complicating pregnancy, first trimester: Secondary | ICD-10-CM | POA: Diagnosis not present

## 2020-05-18 DIAGNOSIS — R109 Unspecified abdominal pain: Secondary | ICD-10-CM | POA: Diagnosis not present

## 2020-05-18 DIAGNOSIS — O2631 Retained intrauterine contraceptive device in pregnancy, first trimester: Secondary | ICD-10-CM

## 2020-05-18 DIAGNOSIS — Z8759 Personal history of other complications of pregnancy, childbirth and the puerperium: Secondary | ICD-10-CM | POA: Insufficient documentation

## 2020-05-18 DIAGNOSIS — O209 Hemorrhage in early pregnancy, unspecified: Secondary | ICD-10-CM | POA: Diagnosis not present

## 2020-05-18 DIAGNOSIS — O0281 Inappropriate change in quantitative human chorionic gonadotropin (hCG) in early pregnancy: Secondary | ICD-10-CM

## 2020-05-18 LAB — HCG, QUANTITATIVE, PREGNANCY: hCG, Beta Chain, Quant, S: 8 m[IU]/mL — ABNORMAL HIGH (ref <5)

## 2020-05-18 MED ORDER — RHO D IMMUNE GLOBULIN 1500 UNIT/2ML IJ SOSY
300.0000 ug | PREFILLED_SYRINGE | Freq: Once | INTRAMUSCULAR | Status: AC
  Administered 2020-05-18: 300 ug via INTRAMUSCULAR
  Filled 2020-05-18: qty 2

## 2020-05-18 NOTE — Progress Notes (Signed)
Faculty Practice OB/GYN Attending MAU Note  Chief Complaint: Vaginal Bleeding and Abdominal Pain    First Provider Initiated Contact with Patient 05/18/20 1305      SUBJECTIVE Gloria Dean is a 34 y.o. G4P3003 at [redacted]w[redacted]d by LMP who presents with vaginal bleeding , positive pregnancy test and post placentally placed IUD with undetectable strings.  Pt seen in MAU on 8/11 with bhcg of 15.  Pt seen again today for reevaluation of IUD and bleeding.  Pt denies heavy bleeding.  She has vaginal bleeding like a light period.  Mild pelvic cramping noted.  Past Medical History:  Diagnosis Date  . Abnormal Pap smear 2010  . Asthma   . Genital warts 12/17/2011   Treated with TCA   . Infection    chylamidia  . Preeclampsia 2113   OB History  Gravida Para Term Preterm AB Living  4 3 3  0 0 3  SAB TAB Ectopic Multiple Live Births  0 0 0 0 3    # Outcome Date GA Lbr Len/2nd Weight Sex Delivery Anes PTL Lv  4 Current           3 Term 09/16/19 [redacted]w[redacted]d 05:10 / 00:04 3779 g M Vag-Spont None  LIV  2 Term 11/15/13 [redacted]w[redacted]d 17:45 / 00:02 2892 g F Vag-Spont None  LIV  1 Term 02/28/12 [redacted]w[redacted]d 09:35 / 00:14 2460 g M Vag-Spont None  LIV   Past Surgical History:  Procedure Laterality Date  . WISDOM TOOTH EXTRACTION     Social History   Socioeconomic History  . Marital status: Married    Spouse name: Not on file  . Number of children: Not on file  . Years of education: Not on file  . Highest education level: Not on file  Occupational History  . Not on file  Tobacco Use  . Smoking status: Former Smoker    Types: Cigarettes    Quit date: 07/09/2011    Years since quitting: 8.8  . Smokeless tobacco: Never Used  . Tobacco comment: socially  Vaping Use  . Vaping Use: Never used  Substance and Sexual Activity  . Alcohol use: Yes    Comment: social, not while pregnancy  . Drug use: No  . Sexual activity: Yes    Partners: Male  Other Topics Concern  . Not on file  Social History Narrative  .  Not on file   Social Determinants of Health   Financial Resource Strain:   . Difficulty of Paying Living Expenses:   Food Insecurity:   . Worried About 09/08/2011 in the Last Year:   . Programme researcher, broadcasting/film/video in the Last Year:   Transportation Needs:   . Barista (Medical):   Freight forwarder Lack of Transportation (Non-Medical):   Physical Activity:   . Days of Exercise per Week:   . Minutes of Exercise per Session:   Stress:   . Feeling of Stress :   Social Connections:   . Frequency of Communication with Friends and Family:   . Frequency of Social Gatherings with Friends and Family:   . Attends Religious Services:   . Active Member of Clubs or Organizations:   . Attends Marland [employer] Meetings:   Banker Marital Status:   Intimate Partner Violence:   . Fear of Current or Ex-Partner:   . Emotionally Abused:   [place] [employer] Physically Abused:   . Sexually Abused:    No current facility-administered medications on file prior to encounter.  Current Outpatient Medications on File Prior to Encounter  Medication Sig Dispense Refill  . acetaminophen (TYLENOL) 325 MG tablet Take 2 tablets (650 mg total) by mouth every 4 (four) hours as needed (for pain scale < 4). 30 tablet 0  . albuterol (PROVENTIL HFA;VENTOLIN HFA) 108 (90 Base) MCG/ACT inhaler Inhale 2 puffs into the lungs every 6 (six) hours as needed for shortness of breath. (Patient not taking: Reported on 08/19/2019) 1 Inhaler 0  . Blood Pressure Monitoring (B-D ASSURE BPM/MANUAL ARM CUFF) MISC 1 Device by Does not apply route once a week. To be monitored regularly at home. 1 each 0  . cetirizine (ZYRTEC) 10 MG tablet Take 10 mg by mouth daily as needed. For allergies    . Prenatal Vit-Fe Fumarate-FA (PRENATAL MULTIVITAMIN) TABS tablet Take 1 tablet by mouth daily at 12 noon.    . triamcinolone cream (KENALOG) 0.1 %      No Known Allergies  ROS: Pertinent items in HPI  OBJECTIVE BP 120/81 (BP Location: Right Arm)   Pulse 92    Temp 98.5 F (36.9 C) (Oral)   Resp 17   Ht 5\' 3"  (1.6 m)   Wt 65.3 kg   LMP 04/06/2020   SpO2 100%   BMI 25.49 kg/m  CONSTITUTIONAL: Well-developed, well-nourished female in no acute distress.  HENT:  Normocephalic, atraumatic, External right and left ear normal. Oropharynx is clear and moist EYES: Conjunctivae and EOM are normal. Pupils are equal, round, and reactive to light. No scleral icterus.  NECK: Normal range of motion, supple, no masses.  Normal thyroid.  SKIN: Skin is warm and dry. No rash noted. Not diaphoretic. No erythema. No pallor. NEUROLGIC: Alert and oriented to person, place, and time. Normal reflexes, muscle tone coordination. No cranial nerve deficit noted. PSYCHIATRIC: Normal mood and affect. Normal behavior. Normal judgment and thought content. CARDIOVASCULAR: Normal heart rate noted RESPIRATORY: Effort and breath sounds normal, no problems with respiration noted. ABDOMEN: Soft, normal bowel sounds, no distention noted.  No tenderness, rebound or guarding.  PELVIC: Normal appearing female external genitalia; normal appearing vaginal mucosa and cervix. Light uterine bleeding noted.  Blood easily cleared and no IUD strings were able to be visualized. MUSCULOSKELETAL: Normal range of motion. No tenderness.  No cyanosis, clubbing, or edema.    LAB RESULTS Results for orders placed or performed during the hospital encounter of 05/18/20 (from the past 48 hour(s))  hCG, quantitative, pregnancy     Status: Abnormal   Collection Time: 05/18/20 12:23 PM  Result Value Ref Range   hCG, Beta Chain, Quant, S 8 (H) <5 mIU/mL    Comment:          GEST. AGE      CONC.  (mIU/mL)   <=1 WEEK        5 - 50     2 WEEKS       50 - 500     3 WEEKS       100 - 10,000     4 WEEKS     1,000 - 30,000     5 WEEKS     3,500 - 115,000   6-8 WEEKS     12,000 - 270,000    12 WEEKS     15,000 - 220,000        FEMALE AND NON-PREGNANT FEMALE:     LESS THAN 5 mIU/mL Performed at [facility], [address] [place], [place]   Rh IG  workup (includes ABO/Rh)     Status: None (Preliminary result)   Collection Time: 05/18/20 12:23 PM  Result Value Ref Range   Gestational Age(Wks) 5    ABO/RH(D) AB NEG    Antibody Screen NEG    Unit Number [id number]    Blood Component Type RHIG    Unit division 00    Status of Unit ISSUED    Transfusion Status      OK TO TRANSFUSE Performed at [facility], [address], [place], [place] [address]     IMAGING No results found.  MAU COURSE Pt seen with spouse in family room.  Discussed in detail current status of pregnancy and likelihood of failed pregnancy due to decrease in bhcg from 15 to 8.  Ectopic pregnancy is unlikely with diminishing quants.  Physical exam performed and pt reassured.  ASSESSMENT Bleeding in early pregnancy with IUD. Likely failed pregnancy PLAN Discharge home Recommend f/u in 1 week for repeat quant, scheduling of outpatient u/s for IUD placement and discussion of removal.  Follow-up Information    Center for [facility]. Schedule an appointment as soon as possible for a visit in 1 week(s).   Specialty: Obstetrics and Gynecology Why: recheck bhcg and possible removal of retained IUD, pt will schedule outpatient gyn u/s at that visit if IUD cannot be removed. Contact information: [address] [place] [address] [phone]             Allergies as of 05/18/2020   No Known Allergies     Medication List    STOP taking these medications   albuterol 108 (90 Base) MCG/ACT inhaler Commonly known as: VENTOLIN HFA   triamcinolone cream 0.1 % Commonly known as: KENALOG     TAKE these medications   acetaminophen 325 MG tablet Commonly known as: Tylenol Take 2 tablets (650 mg total) by mouth every 4 (four) hours as needed (for pain scale < 4).   B-D ASSURE BPM/MANUAL ARM CUFF Misc 1 Device by Does not apply route once a  week. To be monitored regularly at home.   cetirizine 10 MG tablet Commonly known as: ZYRTEC Take 10 mg by mouth daily as needed. For allergies   prenatal multivitamin Tabs tablet Take 1 tablet by mouth daily at 12 noon.        Mariel Aloe, MD

## 2020-05-18 NOTE — Discharge Instructions (Signed)
Vaginal Bleeding During Pregnancy, First Trimester  A small amount of bleeding from the vagina (spotting) is relatively common during early pregnancy. It usually stops on its own. Various things may cause bleeding or spotting during early pregnancy. Some bleeding may be related to the pregnancy, and some may not. In many cases, the bleeding is normal and is not a problem. However, bleeding can also be a sign of something serious. Be sure to tell your health care provider about any vaginal bleeding right away. Some possible causes of vaginal bleeding during the first trimester include:  Infection or inflammation of the cervix.  Growths (polyps) on the cervix.  Miscarriage or threatened miscarriage.  Pregnancy tissue developing outside of the uterus (ectopic pregnancy).  A mass of tissue developing in the uterus due to an egg being fertilized incorrectly (molar pregnancy). Follow these instructions at home: Activity  Follow instructions from your health care provider about limiting your activity. Ask what activities are safe for you.  If needed, make plans for someone to help with your regular activities.  Do not have sex or orgasms until your health care provider says that this is safe. General instructions  Take over-the-counter and prescription medicines only as told by your health care provider.  Pay attention to any changes in your symptoms.  Do not use tampons or douche.  Write down how many pads you use each day, how often you change pads, and how soaked (saturated) they are.  If you pass any tissue from your vagina, save the tissue so you can show it to your health care provider.  Keep all follow-up visits as told by your health care provider. This is important. Contact a health care provider if:  You have vaginal bleeding during any part of your pregnancy.  You have cramps or labor pains.  You have a fever. Get help right away if:  You have severe cramps in your  back or abdomen.  You pass large clots or a large amount of tissue from your vagina.  Your bleeding increases.  You feel light-headed or weak, or you faint.  You have chills.  You are leaking fluid or have a gush of fluid from your vagina. Summary  A small amount of bleeding (spotting) from the vagina is relatively common during early pregnancy.  Various things may cause bleeding or spotting in early pregnancy.  Be sure to tell your health care provider about any vaginal bleeding right away. This information is not intended to replace advice given to you by your health care provider. Make sure you discuss any questions you have with your health care provider. Document Revised: 01/12/2019 Document Reviewed: 12/26/2016 Elsevier Patient Education  2020 Elsevier Inc.  

## 2020-05-18 NOTE — MAU Note (Signed)
Here for blood work.  Had quant drawn yesterday.  Sent in for repeat.  Bleeding a little more then yesterday.  Cramping is mild, about the same.  Has an IUD, placed after delivery. Unable to feel strings, bedside sono confirmed in Jan.

## 2020-05-18 NOTE — Telephone Encounter (Signed)
Pt called and states she is having more bleeding. Discussed with Dr Vergie Living and pt is advised to go back to MAU for stat HCG. He will call MAU provider to make them aware of pt coming back in. Pt verbalizes and understands.

## 2020-05-18 NOTE — MAU Note (Signed)
Late Entry: Pt left after receiving Rhogam, but before getting DC vitals and printed AVS.

## 2020-05-19 ENCOUNTER — Ambulatory Visit: Payer: 59

## 2020-05-19 LAB — RH IG WORKUP (INCLUDES ABO/RH)
ABO/RH(D): AB NEG
Antibody Screen: NEGATIVE
Gestational Age(Wks): 5
Unit division: 0

## 2020-05-23 ENCOUNTER — Other Ambulatory Visit: Payer: 59

## 2020-05-23 ENCOUNTER — Telehealth: Payer: Self-pay | Admitting: Obstetrics and Gynecology

## 2020-05-23 ENCOUNTER — Ambulatory Visit (INDEPENDENT_AMBULATORY_CARE_PROVIDER_SITE_OTHER): Payer: 59 | Admitting: Obstetrics and Gynecology

## 2020-05-23 ENCOUNTER — Encounter: Payer: Self-pay | Admitting: Obstetrics and Gynecology

## 2020-05-23 ENCOUNTER — Other Ambulatory Visit: Payer: Self-pay

## 2020-05-23 ENCOUNTER — Ambulatory Visit
Admission: RE | Admit: 2020-05-23 | Discharge: 2020-05-23 | Disposition: A | Discharge status: Home / Self Care | Payer: 59 | Source: Ambulatory Visit | Attending: Obstetrics and Gynecology | Admitting: Obstetrics and Gynecology

## 2020-05-23 ENCOUNTER — Ambulatory Visit
Admission: RE | Admit: 2020-05-23 | Discharge: 2020-05-23 | Disposition: A | Discharge status: Home / Self Care | Payer: 59 | Attending: Obstetrics and Gynecology | Admitting: Obstetrics and Gynecology

## 2020-05-23 VITALS — BP 119/77 | HR 75

## 2020-05-23 DIAGNOSIS — T8331XA Breakdown (mechanical) of intrauterine contraceptive device, initial encounter: Secondary | ICD-10-CM | POA: Insufficient documentation

## 2020-05-23 DIAGNOSIS — T8332XA Displacement of intrauterine contraceptive device, initial encounter: Secondary | ICD-10-CM

## 2020-05-23 DIAGNOSIS — Z331 Pregnant state, incidental: Secondary | ICD-10-CM | POA: Diagnosis not present

## 2020-05-23 DIAGNOSIS — O3680X0 Pregnancy with inconclusive fetal viability, not applicable or unspecified: Secondary | ICD-10-CM | POA: Insufficient documentation

## 2020-05-23 DIAGNOSIS — T8332XD Displacement of intrauterine contraceptive device, subsequent encounter: Secondary | ICD-10-CM | POA: Diagnosis present

## 2020-05-23 HISTORY — DX: Displacement of intrauterine contraceptive device, initial encounter: T83.32XA

## 2020-05-23 NOTE — Progress Notes (Signed)
Obstetrics and Gynecology Visit Return Patient Evaluation  Appointment Date: 05/23/2020  Primary Care Provider: Patient, No Pcp Per  OBGYN Clinic: Center for [facility]  Chief Complaint: IUD failure follow up  History of Present Illness:  Gloria Dean is a 34 y.o. with above CC. Patient had bleeding similar to a period for about the past week that ended yesterday. Still having some cramping. Last beta hcg in the ED on 8/12 was 8; she also received rhogam on that date. No prior imaging done. Patient set up for IUD removal today. Pt upset with care received in the MAU  Review of Systems: as noted in the History of Present Illness.  Medications:  Gloria Dean had no medications administered during this visit. Current Outpatient Medications  Medication Sig Dispense Refill   acetaminophen (TYLENOL) 325 MG tablet Take 2 tablets (650 mg total) by mouth every 4 (four) hours as needed (for pain scale < 4). 30 tablet 0   Blood Pressure Monitoring (B-D ASSURE BPM/MANUAL ARM CUFF) MISC 1 Device by Does not apply route once a week. To be monitored regularly at home. 1 each 0   cetirizine (ZYRTEC) 10 MG tablet Take 10 mg by mouth daily as needed. For allergies     Prenatal Vit-Fe Fumarate-FA (PRENATAL MULTIVITAMIN) TABS tablet Take 1 tablet by mouth daily at 12 noon.     No current facility-administered medications for this visit.    Allergies: has No Known Allergies.  Physical Exam:  BP 119/77    Pulse 75    Breastfeeding Unknown  There is no height or weight on file to calculate BMI. General appearance: Well nourished, well developed female in no acute distress.  Abdomen: diffusely non tender to palpation, non distended, and no masses, hernias Neuro/Psych:  Normal mood and affect.    Radiology: TVUS done and uterus appears midplane and normal and normal sized and NO obvious IUD seen. Two small dots of hyperechoic (seen right next two each other) at the endometrial  lining which appears normal. Both ovaries normal, no free fluid in the pelvis.    Assessment: lost IUD  Plan:  1. Pregnancy of unknown anatomic location - Beta hCG quant (ref lab) - DG Pelvis 1-2 Views; Future - DG Abd 2 Views; Future  2. IUD failure, pregnant D/w her that it may have fallen out and recommend x-ray to see.  - Beta hCG quant (ref lab) - DG Pelvis 1-2 Views; Future - DG Abd 2 Views; Future  3. Intrauterine contraceptive device threads lost, subsequent encounter - DG Pelvis 1-2 Views; Future - DG Abd 2 Views; Future   RTC: follow up x ray later today.   Cornelia Copa MD Attending Center for [employer] [facility])

## 2020-05-23 NOTE — Telephone Encounter (Signed)
GYN Telephone Note Images reviewed, final read not back, but it looks like the IUD is NOT in the abdomen or pelvis. Will await until final read is back and hcg and touch base with her tomorrow.  Cornelia Copa MD Attending Center for [employer] (Faculty Practice) 05/23/2020 Time: [phone]

## 2020-05-24 LAB — BETA HCG QUANT (REF LAB): hCG Quant: <1 m[IU]/mL

## 2020-05-25 ENCOUNTER — Telehealth: Payer: Self-pay | Admitting: Obstetrics and Gynecology

## 2020-05-25 MED ORDER — NORETHIN ACE-ETH ESTRAD-FE 1-20 MG-MCG PO TABS: 1.0000 | ORAL_TABLET | Freq: Every day | ORAL | 3 refills | Status: DC

## 2020-05-25 NOTE — Telephone Encounter (Signed)
GYN Telephone Note Finalized x-ray results d/w patient no IUD seen in the abdomen and pelvis. Patient used OCPs in the past and would like to do them again.   Cornelia Copa MD Attending Center for [employer] (Faculty Practice) 05/25/2020 Time: 1130am

## 2020-05-26 ENCOUNTER — Encounter: Payer: Self-pay | Admitting: Obstetrics and Gynecology

## 2020-06-15 ENCOUNTER — Encounter: Payer: 59 | Admitting: Family Medicine

## 2020-10-07 DIAGNOSIS — Z8616 Personal history of COVID-19: Secondary | ICD-10-CM

## 2020-10-07 HISTORY — DX: Personal history of COVID-19: Z86.16

## 2021-01-15 ENCOUNTER — Telehealth: Payer: Self-pay | Admitting: *Deleted

## 2021-01-15 NOTE — Telephone Encounter (Signed)
Pt called and had positive UPT on 4/10 and due to her SAB last year would like to get hCGs drawn. Okay per Dr Shawnie Pons. Pt will come in Monday and Wednesday next week,

## 2021-01-22 ENCOUNTER — Other Ambulatory Visit: Payer: 59

## 2021-01-23 ENCOUNTER — Other Ambulatory Visit: Payer: Self-pay

## 2021-01-23 ENCOUNTER — Ambulatory Visit (INDEPENDENT_AMBULATORY_CARE_PROVIDER_SITE_OTHER): Payer: 59 | Admitting: *Deleted

## 2021-01-23 VITALS — BP 120/80 | HR 76

## 2021-01-23 DIAGNOSIS — O209 Hemorrhage in early pregnancy, unspecified: Secondary | ICD-10-CM | POA: Diagnosis not present

## 2021-01-23 DIAGNOSIS — O360910 Maternal care for other rhesus isoimmunization, first trimester, not applicable or unspecified: Secondary | ICD-10-CM | POA: Diagnosis not present

## 2021-01-23 DIAGNOSIS — Z3A01 Less than 8 weeks gestation of pregnancy: Secondary | ICD-10-CM

## 2021-01-23 MED ORDER — RHO D IMMUNE GLOBULIN 1500 UNIT/2ML IJ SOSY
300.0000 ug | PREFILLED_SYRINGE | Freq: Once | INTRAMUSCULAR | Status: AC
  Administered 2021-01-23: 300 ug via INTRAMUSCULAR

## 2021-01-23 NOTE — Progress Notes (Signed)
Patient seen and assessed by nursing staff.  Agree with documentation and plan.  

## 2021-01-23 NOTE — Progress Notes (Signed)
Pt had positive UPT on 01/14/21. Started bleeding on Sunday night and is still having heavy bleeding. Pt here to get Rhogam, and labs drawn.

## 2021-01-24 ENCOUNTER — Other Ambulatory Visit: Payer: 59

## 2021-01-24 LAB — ANTIBODY SCREEN: Antibody Screen: NEGATIVE

## 2021-01-24 LAB — BETA HCG QUANT (REF LAB): hCG Quant: 1 m[IU]/mL

## 2021-01-25 ENCOUNTER — Other Ambulatory Visit: Payer: 59

## 2021-02-13 ENCOUNTER — Ambulatory Visit: Payer: 59 | Admitting: Obstetrics & Gynecology

## 2021-02-13 ENCOUNTER — Encounter: Payer: Self-pay | Admitting: Obstetrics & Gynecology

## 2021-02-13 ENCOUNTER — Other Ambulatory Visit: Payer: Self-pay

## 2021-02-13 VITALS — BP 120/80 | HR 72 | Ht 63.0 in | Wt 141.2 lb

## 2021-02-13 DIAGNOSIS — N96 Recurrent pregnancy loss: Secondary | ICD-10-CM

## 2021-02-13 DIAGNOSIS — R87612 Low grade squamous intraepithelial lesion on cytologic smear of cervix (LGSIL): Secondary | ICD-10-CM

## 2021-02-13 NOTE — Progress Notes (Signed)
GYNECOLOGY OFFICE VISIT NOTE  History:   Gloria Dean is a 35 y.o. [phone] here today for evaluation for two recent miscarriages; had two early SABs in 04/2020 and 01/2021.  The one in 04/2020 was during an episode of tooth abscess, the second one had no other confounding factors.  Had three previous pregnancies, full term but had preeclampsia complication with first one.  Same father of all her pregnancies.  She reports normal regular monthly periods, has noticed increased mid cycle cramping recently but no intermenstrual bleeding.  She denies any abnormal vaginal discharge, bleeding, pelvic pain or other concerns.  She desires iamging and laboratory evaluation.     OB History  Gravida Para Term Preterm AB Living  5 3 3  0 2 3  SAB IAB Ectopic Multiple Live Births  2 0 0 0 3    # Outcome Date GA Lbr Len/2nd Weight Sex Delivery Anes PTL Lv  5 SAB 01/2021          4 SAB 04/2020          3 Term 09/16/19 [redacted]w[redacted]d 05:10 / 00:04 8 lb 5.3 oz (3.779 kg) M Vag-Spont None  LIV     Name: Benefiel,BOY Noeli     Apgar1: 8  Apgar5: 9  2 Term 11/15/13 [redacted]w[redacted]d 17:45 / 00:02 6 lb 6 oz (2.892 kg) F Vag-Spont None  LIV     Name: TASHERA, MONTALVO     Apgar1: 9  Apgar5: 9  1 Term 02/28/12 [redacted]w[redacted]d 09:35 / 00:14 5 lb 6.8 oz (2.46 kg) M Vag-Spont None  LIV     Name: Cohick,BOY Kery     Apgar1: 8  Apgar5: 9    Past Medical History:  Diagnosis Date  . Abnormal Pap smear 2010  . Asthma   . Genital warts 12/17/2011   Treated with TCA   . Infection    chylamidia  . IUD strings lost 05/23/2020   No e/o on u/s or x-ray.   . Preeclampsia 2113    Past Surgical History:  Procedure Laterality Date  . WISDOM TOOTH EXTRACTION      The following portions of the patient's history were reviewed and updated as appropriate: allergies, current medications, past family history, past medical history, past social history, past surgical history and problem list.   Health Maintenance:  LGSIL pap and positive HRHPV on  06/23/2018, benign colposcopy in 08/2018.   Review of Systems:  Pertinent items noted in HPI and remainder of comprehensive ROS otherwise negative.  Physical Exam:  BP 120/80   Pulse 72   Ht 5\' 3"  (1.6 m)   Wt 141 lb 3.2 oz (64 kg)   LMP 01/21/2021   BMI 25.01 kg/m  CONSTITUTIONAL: Well-developed, well-nourished female in no acute distress.  HEENT:  Normocephalic, atraumatic. External right and left ear normal. No scleral icterus.  NECK: Normal range of motion, supple, no masses noted on observation SKIN: No rash noted. Not diaphoretic. No erythema. No pallor. MUSCULOSKELETAL: Normal range of motion. No edema noted. NEUROLOGIC: Alert and oriented to person, place, and time. Normal muscle tone coordination. No cranial nerve deficit noted. PSYCHIATRIC: Normal mood and affect. Normal behavior. Normal judgment and thought content. CARDIOVASCULAR: Normal heart rate noted RESPIRATORY: Effort and breath sounds normal, no problems with respiration noted ABDOMEN: No masses noted. No other overt distention noted.   PELVIC: Deferred     Assessment and Plan:    1. History of two recent miscarriages Discussed evaluation of miscarriages, emphasized that  this could be two isolated events.  Her risk factors at advanced age currently. Low risk of hypercoagulable states, but this will be checked as per her request.  Given her increased mid cycle pain, will check pelvic ultrasound. Will also check TSH and A1C.  Patient aware that the results may not provide an etiology, and was told this does not mean she cannot have another successful pregnancy.  Recommended that she continue to take multivitamins with folic acid supplement, avoid teratogens. If an when she conceives again, will consider progesterone supplementation, and other management depending on results of evaluation today. - TSH - Hemoglobin A1c - Antithrombin III - Protein C activity - Protein C, total - Protein S activity - Protein S, total -  Lupus anticoagulant panel - Beta-2-glycoprotein i abs, IgG/M/A - Homocysteine, serum - Factor 5 leiden - Prothrombin gene mutation - Cardiolipin antibodies, IgG, IgM, IgA - US PELVIC COMPLETE WITH TRANSVAGINAL; Future  2. LGSIL on Pap smear of cervix Patient informed of need for repeat pap, was supposed to get in 2020 but delayed due to pandemic. She will call and make appointment later depending on work schedule etc. Routine preventative health maintenance measures emphasized.  Return for Pap smear as soon as possible.    I spent 25 minutes dedicated to the care of this patient including pre-visit review of records, face to face time with the patient discussing her conditions and treatments and post visit ordering of testing.   Jaynie Collins, MD, FACOG Obstetrician & Gynecologist, [facility] for [employer], [facility]

## 2021-02-20 ENCOUNTER — Other Ambulatory Visit: Payer: Self-pay

## 2021-02-20 DIAGNOSIS — N96 Recurrent pregnancy loss: Secondary | ICD-10-CM

## 2021-02-20 MED ORDER — PROGESTERONE 200 MG PO CAPS: 200.0000 mg | ORAL_CAPSULE | Freq: Every day | ORAL | 3 refills | Status: DC

## 2021-02-21 LAB — CARDIOLIPIN ANTIBODIES, IGG, IGM, IGA
Anticardiolipin IgA: <9 APL U/mL (ref 0–11)
Anticardiolipin IgG: <9 GPL U/mL (ref 0–14)
Anticardiolipin IgM: <9 MPL U/mL (ref 0–12)

## 2021-02-21 LAB — PROTHROMBIN GENE MUTATION

## 2021-02-21 LAB — FACTOR 5 LEIDEN

## 2021-02-21 LAB — PROTEIN S, TOTAL: Protein S Ag, Total: 81 % (ref 60–150)

## 2021-02-21 LAB — PROTEIN S ACTIVITY: Protein S Activity: 83 % (ref 63–140)

## 2021-02-21 LAB — BETA-2-GLYCOPROTEIN I ABS, IGG/M/A
Beta-2 Glyco 1 IgA: <9 GPI IgA units (ref 0–25)
Beta-2 Glyco 1 IgM: <9 GPI IgM units (ref 0–32)
Beta-2 Glyco I IgG: <9 GPI IgG units (ref 0–20)

## 2021-02-21 LAB — LUPUS ANTICOAGULANT PANEL
Dilute Viper Venom Time: 27.3 s (ref 0.0–47.0)
PTT Lupus Anticoagulant: 29.3 s (ref 0.0–51.9)

## 2021-02-21 LAB — PROTEIN C, TOTAL: Protein C Antigen: 115 % (ref 60–150)

## 2021-02-21 LAB — HEMOGLOBIN A1C
Est. average glucose Bld gHb Est-mCnc: 105 mg/dL
Hgb A1c MFr Bld: 5.3 % (ref 4.8–5.6)

## 2021-02-21 LAB — ANTITHROMBIN III: AntiThromb III Func: 118 % (ref 75–135)

## 2021-02-21 LAB — PROTEIN C ACTIVITY: Protein C Activity: 137 % (ref 73–180)

## 2021-02-21 LAB — HOMOCYSTEINE: Homocysteine: 7.2 umol/L (ref 0.0–14.5)

## 2021-02-21 LAB — TSH: TSH: 3.72 u[IU]/mL (ref 0.450–4.500)

## 2021-03-01 ENCOUNTER — Ambulatory Visit
Admission: RE | Admit: 2021-03-01 | Discharge: 2021-03-01 | Disposition: A | Discharge status: Home / Self Care | Payer: 59 | Source: Ambulatory Visit | Attending: Obstetrics & Gynecology | Admitting: Obstetrics & Gynecology

## 2021-03-01 ENCOUNTER — Telehealth: Payer: Self-pay | Admitting: *Deleted

## 2021-03-01 ENCOUNTER — Other Ambulatory Visit: Payer: Self-pay

## 2021-03-01 DIAGNOSIS — N96 Recurrent pregnancy loss: Secondary | ICD-10-CM | POA: Diagnosis present

## 2021-03-01 NOTE — Telephone Encounter (Signed)
-----   Message from Tereso Newcomer, MD sent at 03/01/2021  1:19 PM EDT ----- Nonspecific findings, very early pregnancy.  If no concerning symptoms, can repeat viability scan in 2 -3 weeks. Already on progesterone, no further intervention needed for now. Encourage to continue prenatal vitamins and folic acid.  Please call to inform patient of results and recommendations.

## 2021-03-01 NOTE — Telephone Encounter (Signed)
Pt informed of [place] results and recommendations. Pt will come back in 2 weeks for viability scan in the office.

## 2021-03-06 ENCOUNTER — Encounter: Payer: 59 | Admitting: Obstetrics and Gynecology

## 2021-03-14 ENCOUNTER — Ambulatory Visit: Payer: 59 | Admitting: Obstetrics and Gynecology

## 2021-03-15 ENCOUNTER — Ambulatory Visit (INDEPENDENT_AMBULATORY_CARE_PROVIDER_SITE_OTHER): Payer: 59

## 2021-03-15 ENCOUNTER — Other Ambulatory Visit: Payer: Self-pay

## 2021-03-15 ENCOUNTER — Ambulatory Visit (INDEPENDENT_AMBULATORY_CARE_PROVIDER_SITE_OTHER): Payer: 59 | Admitting: *Deleted

## 2021-03-15 VITALS — BP 116/74 | HR 79 | Wt 142.0 lb

## 2021-03-15 DIAGNOSIS — O3680X0 Pregnancy with inconclusive fetal viability, not applicable or unspecified: Secondary | ICD-10-CM

## 2021-03-15 DIAGNOSIS — Z3A08 8 weeks gestation of pregnancy: Secondary | ICD-10-CM | POA: Diagnosis not present

## 2021-03-15 NOTE — Progress Notes (Addendum)
Pt here for viabilty scan. Pt had recent miscarriage and has not had a cycle since then.   US performed, Dr Shawnie Pons at bedside as well. Pt to follow up and repeat scan in 2 weeks.   Patient informed that the ultrasound is considered a limited obstetric ultrasound and is not intended to be a complete ultrasound exam.  Patient also informed that the ultrasound is not being completed with the intent of assessing for fetal or placental anomalies or any pelvic abnormalities. Explained that the purpose of today's ultrasound is to assess for viability.  Patient acknowledges the purpose of the exam and the limitations of the study.

## 2021-03-16 NOTE — Progress Notes (Signed)
Patient seen and assessed by nursing staff.  Agree with documentation and plan.  

## 2021-03-28 ENCOUNTER — Other Ambulatory Visit: Payer: Self-pay

## 2021-03-28 ENCOUNTER — Encounter: Payer: Self-pay | Admitting: Family Medicine

## 2021-03-28 ENCOUNTER — Ambulatory Visit (INDEPENDENT_AMBULATORY_CARE_PROVIDER_SITE_OTHER): Payer: 59

## 2021-03-28 ENCOUNTER — Ambulatory Visit (INDEPENDENT_AMBULATORY_CARE_PROVIDER_SITE_OTHER): Payer: 59 | Admitting: Family Medicine

## 2021-03-28 VITALS — BP 119/75 | HR 82

## 2021-03-28 DIAGNOSIS — O021 Missed abortion: Secondary | ICD-10-CM | POA: Diagnosis not present

## 2021-03-28 DIAGNOSIS — Z8759 Personal history of other complications of pregnancy, childbirth and the puerperium: Secondary | ICD-10-CM

## 2021-03-28 DIAGNOSIS — Z6791 Unspecified blood type, Rh negative: Secondary | ICD-10-CM

## 2021-03-28 MED ORDER — RHO D IMMUNE GLOBULIN 1500 UNIT/2ML IJ SOSY
300.0000 ug | PREFILLED_SYRINGE | Freq: Once | INTRAMUSCULAR | Status: AC
  Administered 2021-03-28: 14:00:00 300 ug via INTRAMUSCULAR

## 2021-03-28 NOTE — Progress Notes (Signed)
   Subjective:    Patient ID: Gloria Dean is a 35 y.o. female presenting with viability scan   on 03/28/2021  HPI: Patient initially here for viability scan. Had 1st [place] 2 weeks ago and showed SIUP with some bradycardia. There is no progression of the pregnancy and loss of FHR today. Patient with 2 prior SABs that were chemical. She denies bleeding, cramping. She had a w/u for recurrent AB that was negative.  Review of Systems  Constitutional:  Negative for chills and fever.  Respiratory:  Negative for shortness of breath.   Cardiovascular:  Negative for chest pain.  Gastrointestinal:  Negative for abdominal pain, nausea and vomiting.  Genitourinary:  Negative for dysuria.  Skin:  Negative for rash.     Objective:    BP 119/75   Pulse 82   LMP  (LMP Unknown)  Physical Exam Constitutional:      General: She is not in acute distress.    Appearance: She is well-developed.  HENT:     Head: Normocephalic and atraumatic.  Eyes:     General: No scleral icterus. Cardiovascular:     Rate and Rhythm: Normal rate.  Pulmonary:     Effort: Pulmonary effort is normal.  Abdominal:     Palpations: Abdomen is soft.  Musculoskeletal:     Cervical back: Neck supple.  Skin:    General: Skin is warm and dry.  Neurological:     Mental Status: She is alert and oriented to person, place, and time.   TVUS reveals large yok sac and CRL of 6 wk 3 days and no FHR.     Assessment & Plan:   Problem List Items Addressed This Visit       Unprioritized   Missed ab    Offered expectant mgmt, Cytotec or surgical option. She will consider. Given [name] for further evaluation.       Other Visit Diagnoses     Ultrasound scan to confirm fetal viability with history of miscarriage    -  Primary   Relevant Orders   US OB Limited   Rh negative status during pregnancy in first trimester       Given Rhogam in office.       Return in about 4 weeks (around 04/25/2021) for a  follow-up.  Reva Bores 03/28/2021 9:50 PM

## 2021-03-28 NOTE — Assessment & Plan Note (Addendum)
Offered expectant mgmt, Cytotec or surgical option. She will consider. Given [name] for further evaluation.

## 2021-03-28 NOTE — Progress Notes (Signed)
Here for repeat US    Patient informed that the ultrasound is considered a limited obstetric ultrasound and is not intended to be a complete ultrasound exam.  Patient also informed that the ultrasound is not being completed with the intent of assessing for fetal or placental anomalies or any pelvic abnormalities. Explained that the purpose of today's ultrasound is to assess for fetal heart rate.  Patient acknowledges the purpose of the exam and the limitations of the study.

## 2021-04-03 ENCOUNTER — Encounter (HOSPITAL_BASED_OUTPATIENT_CLINIC_OR_DEPARTMENT_OTHER): Payer: Self-pay | Admitting: Obstetrics & Gynecology

## 2021-04-03 ENCOUNTER — Telehealth: Payer: Self-pay | Admitting: *Deleted

## 2021-04-03 ENCOUNTER — Other Ambulatory Visit: Payer: Self-pay

## 2021-04-03 ENCOUNTER — Ambulatory Visit
Admission: RE | Admit: 2021-04-03 | Discharge: 2021-04-03 | Disposition: A | Discharge status: Home / Self Care | Payer: 59 | Source: Ambulatory Visit | Attending: Obstetrics & Gynecology | Admitting: Obstetrics & Gynecology

## 2021-04-03 ENCOUNTER — Encounter: Payer: 59 | Admitting: Obstetrics & Gynecology

## 2021-04-03 DIAGNOSIS — O021 Missed abortion: Secondary | ICD-10-CM | POA: Diagnosis present

## 2021-04-03 DIAGNOSIS — N96 Recurrent pregnancy loss: Secondary | ICD-10-CM | POA: Diagnosis present

## 2021-04-03 NOTE — Telephone Encounter (Signed)
Pt called stating she has not passed the baby and still feels pregnancy and would like to go ahead and schedule a D&C, but would like to get a formal scan done prior too. Will discuss with Dr Macon Large and have her call pt as well to discuss a D and C and will order [place] stat to get that scheduled.

## 2021-04-03 NOTE — Telephone Encounter (Signed)
Following patient's office visits with MD, called to patient and advised surgery scheduled for tomorrow, 04-04-21 at 230 at [facility]. Advised to expect arrival to be 12- 1230 and to call surgery center at [phone] due to surgery tomorrow. They will review arrival time and instructions.

## 2021-04-03 NOTE — Progress Notes (Signed)
Spoke w/ via phone for pre-op interview--- Pt Lab needs dos---- CBC              Lab results------ no COVID test -----patient states asymptomatic no test needed Arrive at ------- 1215 on 04-04-2021 NPO after MN NO Solid Food.  Clear liquids from MN until--- 1115 Med rec completed Medications to take morning of surgery ----- NONE Diabetic medication ----- n/a Patient instructed no nail polish to be worn day of surgery Patient instructed to bring photo id and insurance card day of surgery Patient aware to have Driver (ride ) / caregiver for 24 hours after surgery --- husband, Minerva Areola Patient Special Instructions ----- n/a Pre-Op special Istructions ----- n/a Patient verbalized understanding of instructions that were given at this phone interview. Patient denies shortness of breath, chest pain, fever, cough at this phone interview.

## 2021-04-04 ENCOUNTER — Ambulatory Visit (HOSPITAL_BASED_OUTPATIENT_CLINIC_OR_DEPARTMENT_OTHER): Payer: 59 | Admitting: Anesthesiology

## 2021-04-04 ENCOUNTER — Ambulatory Visit (HOSPITAL_BASED_OUTPATIENT_CLINIC_OR_DEPARTMENT_OTHER)
Admission: RE | Admit: 2021-04-04 | Discharge: 2021-04-04 | Disposition: A | Discharge status: Home / Self Care | Payer: 59 | Attending: Obstetrics & Gynecology | Admitting: Obstetrics & Gynecology

## 2021-04-04 ENCOUNTER — Encounter (HOSPITAL_BASED_OUTPATIENT_CLINIC_OR_DEPARTMENT_OTHER)
Admission: RE | Disposition: A | Discharge status: Home / Self Care | Payer: Self-pay | Source: Home / Self Care | Attending: Obstetrics & Gynecology

## 2021-04-04 ENCOUNTER — Encounter (HOSPITAL_BASED_OUTPATIENT_CLINIC_OR_DEPARTMENT_OTHER): Payer: Self-pay | Admitting: Obstetrics & Gynecology

## 2021-04-04 DIAGNOSIS — O411010 Infection of amniotic sac and membranes, unspecified, first trimester, not applicable or unspecified: Secondary | ICD-10-CM | POA: Insufficient documentation

## 2021-04-04 DIAGNOSIS — Z8679 Personal history of other diseases of the circulatory system: Secondary | ICD-10-CM | POA: Diagnosis not present

## 2021-04-04 DIAGNOSIS — Z8616 Personal history of COVID-19: Secondary | ICD-10-CM | POA: Diagnosis not present

## 2021-04-04 DIAGNOSIS — Z87891 Personal history of nicotine dependence: Secondary | ICD-10-CM | POA: Diagnosis not present

## 2021-04-04 DIAGNOSIS — Z8619 Personal history of other infectious and parasitic diseases: Secondary | ICD-10-CM | POA: Diagnosis not present

## 2021-04-04 DIAGNOSIS — O021 Missed abortion: Secondary | ICD-10-CM | POA: Insufficient documentation

## 2021-04-04 DIAGNOSIS — Z8759 Personal history of other complications of pregnancy, childbirth and the puerperium: Secondary | ICD-10-CM | POA: Diagnosis not present

## 2021-04-04 DIAGNOSIS — Z3A09 9 weeks gestation of pregnancy: Secondary | ICD-10-CM | POA: Diagnosis not present

## 2021-04-04 HISTORY — DX: Recurrent pregnancy loss: N96

## 2021-04-04 HISTORY — DX: Personal history of other infectious and parasitic diseases: Z86.19

## 2021-04-04 HISTORY — DX: Personal history of other complications of pregnancy, childbirth and the puerperium: Z87.59

## 2021-04-04 HISTORY — DX: Presence of spectacles and contact lenses: Z97.3

## 2021-04-04 HISTORY — PX: DILATION AND EVACUATION: SHX1459

## 2021-04-04 HISTORY — DX: Personal history of other diseases of the respiratory system: Z87.09

## 2021-04-04 LAB — CBC
HCT: 36.2 % (ref 36.0–46.0)
Hemoglobin: 12.7 g/dL (ref 12.0–15.0)
MCH: 29.8 pg (ref 26.0–34.0)
MCHC: 35.1 g/dL (ref 30.0–36.0)
MCV: 85 fL (ref 80.0–100.0)
Platelets: 275 10*3/uL (ref 150–400)
RBC: 4.26 MIL/uL (ref 3.87–5.11)
RDW: 12.3 % (ref 11.5–15.5)
WBC: 8.1 10*3/uL (ref 4.0–10.5)
nRBC: 0 % (ref 0.0–0.2)

## 2021-04-04 SURGERY — DILATION AND EVACUATION, UTERUS
Anesthesia: General | Site: Vagina

## 2021-04-04 MED ORDER — OXYCODONE HCL 5 MG PO TABS: 5.0000 mg | ORAL_TABLET | Freq: Once | ORAL | Status: DC | PRN

## 2021-04-04 MED ORDER — ONDANSETRON HCL 4 MG/2ML IJ SOLN
INTRAMUSCULAR | Status: AC
  Filled 2021-04-04: qty 2

## 2021-04-04 MED ORDER — POVIDONE-IODINE 10 % EX SWAB: 2.0000 "application " | Freq: Once | CUTANEOUS | Status: DC

## 2021-04-04 MED ORDER — FENTANYL CITRATE (PF) 100 MCG/2ML IJ SOLN
INTRAMUSCULAR | Status: AC
  Filled 2021-04-04: qty 2

## 2021-04-04 MED ORDER — KETOROLAC TROMETHAMINE 30 MG/ML IJ SOLN
INTRAMUSCULAR | Status: DC | PRN
  Administered 2021-04-04: 30 mg via INTRAVENOUS

## 2021-04-04 MED ORDER — LACTATED RINGERS IV SOLN: INTRAVENOUS | Status: DC

## 2021-04-04 MED ORDER — PROPOFOL 10 MG/ML IV BOLUS
INTRAVENOUS | Status: DC | PRN
  Administered 2021-04-04: 150 mg via INTRAVENOUS

## 2021-04-04 MED ORDER — DOCUSATE SODIUM 100 MG PO CAPS: 100.0000 mg | ORAL_CAPSULE | Freq: Two times a day (BID) | ORAL | 2 refills | Status: DC | PRN

## 2021-04-04 MED ORDER — IBUPROFEN 600 MG PO TABS: 600.0000 mg | ORAL_TABLET | Freq: Four times a day (QID) | ORAL | 2 refills | Status: DC | PRN

## 2021-04-04 MED ORDER — ONDANSETRON HCL 4 MG/2ML IJ SOLN
INTRAMUSCULAR | Status: DC | PRN
  Administered 2021-04-04: 4 mg via INTRAVENOUS

## 2021-04-04 MED ORDER — KETOROLAC TROMETHAMINE 30 MG/ML IJ SOLN
INTRAMUSCULAR | Status: AC
  Filled 2021-04-04: qty 1

## 2021-04-04 MED ORDER — BUPIVACAINE HCL 0.5 % IJ SOLN
INTRAMUSCULAR | Status: DC | PRN
  Administered 2021-04-04: 30 mL

## 2021-04-04 MED ORDER — OXYCODONE HCL 5 MG/5ML PO SOLN: 5.0000 mg | Freq: Once | ORAL | Status: DC | PRN

## 2021-04-04 MED ORDER — MISOPROSTOL 200 MCG PO TABS
ORAL_TABLET | ORAL | Status: AC
  Filled 2021-04-04: qty 4

## 2021-04-04 MED ORDER — LIDOCAINE 2% (20 MG/ML) 5 ML SYRINGE
INTRAMUSCULAR | Status: DC | PRN
  Administered 2021-04-04: 60 mg via INTRAVENOUS

## 2021-04-04 MED ORDER — LIDOCAINE HCL (PF) 2 % IJ SOLN
INTRAMUSCULAR | Status: AC
  Filled 2021-04-04: qty 5

## 2021-04-04 MED ORDER — DEXAMETHASONE SODIUM PHOSPHATE 10 MG/ML IJ SOLN
INTRAMUSCULAR | Status: AC
  Filled 2021-04-04: qty 1

## 2021-04-04 MED ORDER — OXYTOCIN 10 UNIT/ML IJ SOLN
INTRAMUSCULAR | Status: AC
  Filled 2021-04-04: qty 1

## 2021-04-04 MED ORDER — FENTANYL CITRATE (PF) 100 MCG/2ML IJ SOLN
INTRAMUSCULAR | Status: DC | PRN
  Administered 2021-04-04: 50 ug via INTRAVENOUS

## 2021-04-04 MED ORDER — CARBOPROST TROMETHAMINE 250 MCG/ML IM SOLN
INTRAMUSCULAR | Status: AC
  Filled 2021-04-04: qty 1

## 2021-04-04 MED ORDER — PROPOFOL 10 MG/ML IV BOLUS
INTRAVENOUS | Status: AC
  Filled 2021-04-04: qty 20

## 2021-04-04 MED ORDER — MEPERIDINE HCL 25 MG/ML IJ SOLN: 6.2500 mg | INTRAMUSCULAR | Status: DC | PRN

## 2021-04-04 MED ORDER — METHYLERGONOVINE MALEATE 0.2 MG/ML IJ SOLN
INTRAMUSCULAR | Status: AC
  Filled 2021-04-04: qty 1

## 2021-04-04 MED ORDER — PROMETHAZINE HCL 25 MG/ML IJ SOLN: 6.2500 mg | INTRAMUSCULAR | Status: DC | PRN

## 2021-04-04 MED ORDER — MIDAZOLAM HCL 2 MG/2ML IJ SOLN
INTRAMUSCULAR | Status: DC | PRN
  Administered 2021-04-04: 2 mg via INTRAVENOUS

## 2021-04-04 MED ORDER — DOXYCYCLINE HYCLATE 100 MG IV SOLR
200.0000 mg | INTRAVENOUS | Status: AC
  Administered 2021-04-04: 200 mg via INTRAVENOUS
  Filled 2021-04-04: qty 200

## 2021-04-04 MED ORDER — BUPIVACAINE HCL (PF) 0.5 % IJ SOLN
INTRAMUSCULAR | Status: AC
  Filled 2021-04-04: qty 30

## 2021-04-04 MED ORDER — OXYCODONE HCL 5 MG PO TABS: 5.0000 mg | ORAL_TABLET | ORAL | 0 refills | Status: DC | PRN

## 2021-04-04 MED ORDER — 0.9 % SODIUM CHLORIDE (POUR BTL) OPTIME
TOPICAL | Status: DC | PRN
  Administered 2021-04-04: 500 mL

## 2021-04-04 MED ORDER — DEXAMETHASONE SODIUM PHOSPHATE 10 MG/ML IJ SOLN
INTRAMUSCULAR | Status: DC | PRN
  Administered 2021-04-04: 5 mg via INTRAVENOUS

## 2021-04-04 MED ORDER — FENTANYL CITRATE (PF) 100 MCG/2ML IJ SOLN: 25.0000 ug | INTRAMUSCULAR | Status: DC | PRN

## 2021-04-04 MED ORDER — MIDAZOLAM HCL 2 MG/2ML IJ SOLN
INTRAMUSCULAR | Status: AC
  Filled 2021-04-04: qty 2

## 2021-04-04 SURGICAL SUPPLY — 23 items
CATH ROBINSON RED A/P 14FR (CATHETERS) ×2 IMPLANT
DECANTER SPIKE VIAL GLASS SM (MISCELLANEOUS) ×2 IMPLANT
FILTER UTR ASPR ASSEMBLY (MISCELLANEOUS) IMPLANT
GLOVE SURG LTX SZ7 (GLOVE) ×2 IMPLANT
GLOVE SURG UNDER POLY LF SZ7 (GLOVE) ×6 IMPLANT
GLOVE SURG UNDER POLY LF SZ7.5 (GLOVE) ×2 IMPLANT
GOWN STRL REUS W/TWL LRG LVL3 (GOWN DISPOSABLE) ×4 IMPLANT
HOSE CONNECTING 18IN BERKELEY (TUBING) ×2 IMPLANT
KIT BERKELEY 1ST TRI 3/8 NO TR (MISCELLANEOUS) IMPLANT
KIT BERKELEY 1ST TRIMESTER 3/8 (MISCELLANEOUS) ×4 IMPLANT
KIT TURNOVER CYSTO (KITS) ×2 IMPLANT
NS IRRIG 1000ML POUR BTL (IV SOLUTION) ×2 IMPLANT
PACK VAGINAL MINOR WOMEN LF (CUSTOM PROCEDURE TRAY) ×2 IMPLANT
PAD OB MATERNITY 4.3X12.25 (PERSONAL CARE ITEMS) ×2 IMPLANT
PAD PREP 24X48 CUFFED NSTRL (MISCELLANEOUS) ×2 IMPLANT
SET BERKELEY SUCTION TUBING (SUCTIONS) ×2 IMPLANT
SLEEVE SCD COMPRESS KNEE MED (STOCKING) ×2 IMPLANT
TOWEL OR 17X26 10 PK STRL BLUE (TOWEL DISPOSABLE) ×2 IMPLANT
VACURETTE 10 RIGID CVD (CANNULA) IMPLANT
VACURETTE 6 ASPIR F TIP BERK (CANNULA) IMPLANT
VACURETTE 7MM CVD STRL WRAP (CANNULA) ×2 IMPLANT
VACURETTE 8 RIGID CVD (CANNULA) IMPLANT
VACURETTE 9 RIGID CVD (CANNULA) IMPLANT

## 2021-04-04 NOTE — Transfer of Care (Signed)
Immediate Anesthesia Transfer of Care Note  Patient: Gloria Dean  Procedure(s) Performed: Procedure(s) (LRB): DILATATION AND EVACUATION with [name] testing (N/A) CHROMOSOME STUDIES (N/A)  Patient Location: PACU  Anesthesia Type: General  Level of Consciousness: awake, oriented, sedated and patient cooperative  Airway & Oxygen Therapy: Patient Spontanous Breathing and Patient connected to face mask oxygen  Post-op Assessment: Report given to PACU RN and Post -op Vital signs reviewed and stable  Post vital signs: Reviewed and stable  Complications: No apparent anesthesia complications Last Vitals:  Vitals Value Taken Time  BP 115/71 04/04/21 1515  Temp    Pulse 63 04/04/21 1518  Resp 12 04/04/21 1518  SpO2 100 % 04/04/21 1518  Vitals shown include unvalidated device data.  Last Pain:  Vitals:   04/04/21 1512  TempSrc:   PainSc: 0-No pain      Patients Stated Pain Goal: 4 (04/04/21 1248)  Complications: No notable events documented.

## 2021-04-04 NOTE — Op Note (Signed)
Gloria Dean PROCEDURE DATE: 04/04/2021  PREOPERATIVE DIAGNOSIS: Six week missed abortion POSTOPERATIVE DIAGNOSIS: The same PROCEDURE: Dilation and Evacuation SURGEON:  Dr. Jaynie Collins  INDICATIONS: 35 y.o. [id number] with MAB at six weeks gestation, needing surgical completion.  Risks of surgery were discussed with the patient including but not limited to: bleeding which may require transfusion; infection which may require antibiotics; injury to uterus or surrounding organs; need for additional procedures including laparotomy or laparoscopy; possibility of intrauterine scarring which may impair future fertility; and other postoperative/anesthesia complications. Written informed consent was obtained.    FINDINGS:  A six week size uterus, moderate amounts of products of conception, specimen sent to pathology.  ANESTHESIA: General-LMA, paracervical block. ESTIMATED BLOOD LOSS:  50 ml. SPECIMENS:  Products of conception sent to pathology and some were sent for [facility] genetic analysis. COMPLICATIONS:  None immediate.  PROCEDURE DETAILS:  The patient received intravenous Doxycycline while in the preoperative area.  She was then taken to the operating room where general anesthesia was administered and was found to be adequate.  After an adequate timeout was performed, she was placed in the dorsal lithotomy position and examined; then prepped and draped in the sterile manner.   Her bladder was catheterized for an unmeasured amount of clear, yellow urine. A vaginal speculum was then placed in the patient's vagina and a single tooth tenaculum was applied to the anterior lip of the cervix.  A paracervical block using 30 ml of 0.5% Marcaine was administered. The cervix was gently dilated to accommodate a 7 mm suction curette that was gently advanced to the uterine fundus.  The suction device was then activated and curette slowly rotated to clear the uterus of products of conception.  Suction curettage was  done until complete emptying of the uterus was confirmed. There was minimal bleeding noted and the tenaculum removed with good hemostasis noted.   All instruments were removed from the patient's vagina.  Sponge and instrument counts were correct times two  The patient tolerated the procedure well and was taken to the recovery area extubated, awake, and in stable condition.  The patient will be discharged to home as per PACU criteria.  Routine postoperative instructions given.  She was prescribed Oxycodone, Ibuprofen and Colace.  She will follow up in the office in 2-3 weeks for postoperative evaluation.   Jaynie Collins, MD, FACOG Obstetrician & Gynecologist, [facility] for [employer], [facility]

## 2021-04-04 NOTE — Discharge Instructions (Signed)
  Post Anesthesia Home Care Instructions  Activity: Get plenty of rest for the remainder of the day. A responsible individual must stay with you for 24 hours following the procedure.  For the next 24 hours, DO NOT: -Drive a car -Operate machinery -Drink alcoholic beverages -Take any medication unless instructed by your physician -Make any legal decisions or sign important papers.  Meals: Start with liquid foods such as gelatin or soup. Progress to regular foods as tolerated. Avoid greasy, spicy, heavy foods. If nausea and/or vomiting occur, drink only clear liquids until the nausea and/or vomiting subsides. Call your physician if vomiting continues.  Special Instructions/Symptoms: Your throat may feel dry or sore from the anesthesia or the breathing tube placed in your throat during surgery. If this causes discomfort, gargle with warm salt water. The discomfort should disappear within 24 hours.  DISCHARGE INSTRUCTIONS: D&C  The following instructions have been prepared to help you care for yourself upon your return home.   Personal hygiene:  Use sanitary pads for vaginal drainage, not tampons.  Shower the day after your procedure.  NO tub baths, pools or Jacuzzis for 2-3 weeks.  Wipe front to back after using the bathroom.  Activity and limitations:  Do NOT drive or operate any equipment for 24 hours. The effects of anesthesia are still present and drowsiness may result.  Do NOT rest in bed all day.  Walking is encouraged.  Walk up and down stairs slowly.  You may resume your normal activity in one to two days or as indicated by your physician.  Sexual activity: NO intercourse for at least 2 weeks after the procedure, or as indicated by your physician.  Diet: Eat a light meal as desired this evening. You may resume your usual diet tomorrow.  Return to work: You may resume your work activities in one to two days or as indicated by your doctor.  What to expect after your surgery:  Expect to have vaginal bleeding/discharge for 2-3 days and spotting for up to 10 days. It is not unusual to have soreness for up to 1-2 weeks. You may have a slight burning sensation when you urinate for the first day. Mild cramps may continue for a couple of days. You may have a regular period in 2-6 weeks.  Call your doctor for any of the following:  Excessive vaginal bleeding, saturating and changing one pad every hour.  Inability to urinate 6 hours after discharge from hospital.  Pain not relieved by pain medication.  Fever of 100.4 F or greater.  Unusual vaginal discharge or odor.            

## 2021-04-04 NOTE — H&P (Signed)
Preoperative History and Physical  Gloria Dean is a 35 y.o. [id number] here for surgical management of missed abortion around six weeks gestation.   No significant preoperative concerns.  Proposed surgery: Dilation and Evacuation  Past Medical History:  Diagnosis Date   History of 2019 novel coronavirus disease (COVID-19) 10/2020   per pt mild symptoms that resolved   History of abnormal cervical Pap smear 2010   History of asthma    per pt as teen seasonal asthma but no issue since   History of chlamydia    History of genital warts 12/17/2011   Treated with TCA    History of pregnancy induced hypertension    preeclampsia   History of recurrent miscarriages    Wears contact lenses    Past Surgical History:  Procedure Laterality Date   WISDOM TOOTH EXTRACTION     teen   OB History  Gravida Para Term Preterm AB Living  6 3 3  0 3 3  SAB IAB Ectopic Multiple Live Births  3 0 0 0 3    # Outcome Date GA Lbr Len/2nd Weight Sex Delivery Anes PTL Lv  6 SAB 04/03/21 [redacted]w[redacted]d         5 SAB 01/2021          4 SAB 04/2020          3 Term 09/16/19 [redacted]w[redacted]d 05:10 / 00:04 3779 g M Vag-Spont None  LIV  2 Term 11/15/13 [redacted]w[redacted]d 17:45 / 00:02 2892 g F Vag-Spont None  LIV  1 Term 02/28/12 [redacted]w[redacted]d 09:35 / 00:14 2460 g M Vag-Spont None  LIV  Patient denies any other pertinent gynecologic issues.   No current facility-administered medications on file prior to encounter.   Current Outpatient Medications on File Prior to Encounter  Medication Sig Dispense Refill   cetirizine (ZYRTEC) 10 MG tablet Take 10 mg by mouth daily as needed. For allergies     ibuprofen (ADVIL) 400 MG tablet Take 400 mg by mouth as needed for mild pain.     Prenatal Vit-Fe Fumarate-FA (PRENATAL MULTIVITAMIN) TABS tablet Take 1 tablet by mouth daily.     Blood Pressure Monitoring (B-D ASSURE BPM/MANUAL ARM CUFF) MISC 1 Device by Does not apply route once a week. To be monitored regularly at home. 1 each 0   progesterone  (PROMETRIUM) 200 MG capsule Take 1 capsule (200 mg total) by mouth daily. (Patient not taking: Reported on 04/03/2021) 30 capsule 3   No Known Allergies  Social History:   reports that she quit smoking about 10 years ago. Her smoking use included cigarettes. She has never used smokeless tobacco. She reports previous alcohol use. She reports that she does not use drugs.  Family History  Problem Relation Age of Onset   Cancer Maternal Grandfather        lung    Review of Systems: Pertinent items noted in HPI and remainder of comprehensive ROS otherwise negative.  PHYSICAL EXAM: Blood pressure 111/70, pulse 72, temperature 98.2 F (36.8 C), temperature source Oral, resp. rate 14, height 5\' 3"  (1.6 m), weight 64.6 kg, SpO2 100 %, unknown if currently breastfeeding. CONSTITUTIONAL: Well-developed, well-nourished female in no acute distress.  HENT:  Normocephalic, atraumatic, External right and left ear normal. Oropharynx is clear and moist EYES: Conjunctivae and EOM are normal. Pupils are equal, round, and reactive to light. No scleral icterus.  NECK: Normal range of motion, supple, no masses SKIN: Skin is warm and dry. No rash noted. Not  diaphoretic. No erythema. No pallor. NEUROLOGIC: Alert and oriented to person, place, and time. Normal reflexes, muscle tone coordination. No cranial nerve deficit noted. PSYCHIATRIC: Depressed mood and affect. Normal behavior. Normal judgment and thought content. CARDIOVASCULAR: Normal heart rate noted, regular rhythm RESPIRATORY: Effort and breath sounds normal, no problems with respiration noted ABDOMEN: Soft, nontender, nondistended. PELVIC: Deferred MUSCULOSKELETAL: Normal range of motion. No edema and no tenderness. 2+ distal pulses.  Labs: Results for orders placed or performed during the hospital encounter of 04/04/21 (from the past 336 hour(s))  CBC   Collection Time: 04/04/21 12:52 PM  Result Value Ref Range   WBC 8.1 4.0 - 10.5 K/uL   RBC  4.26 3.87 - 5.11 MIL/uL   Hemoglobin 12.7 12.0 - 15.0 g/dL   HCT 16.1 09.6 - 04.5 %   MCV 85.0 80.0 - 100.0 fL   MCH 29.8 26.0 - 34.0 pg   MCHC 35.1 30.0 - 36.0 g/dL   RDW 40.9 81.1 - 91.4 %   Platelets 275 150 - 400 K/uL   nRBC 0.0 0.0 - 0.2 %    Imaging Studies: US OB Limited  Result Date: 03/30/2021 ----------------------------------------------------------------------  OBSTETRICS REPORT                       (Signed Final 03/30/2021 09:46 am) ---------------------------------------------------------------------- Patient Info  ID #:       [id number]                          D.O.B.:  1986/07/02 (34 yrs)  Name:       Gloria Dean                Visit Date: 03/28/2021 02:29 pm ---------------------------------------------------------------------- Performed By  Attending:        Tinnie Gens MD         Ref. Address:     [address]                                                             [place], [place]                                                             [address]  Performed By:     Scheryl Marten RN     [facility]  Referred By:      Reva Bores                    MD ---------------------------------------------------------------------- Orders  #  Description                           Code        Ordered By  1  US OB LIMITED                         09470.9     Tinnie Gens ----------------------------------------------------------------------  #  Order #                     Accession #                Episode #  1  [id number]                   [id number]                 [id number] ---------------------------------------------------------------------- Indications  Pregnancy with inconclusive fetal  viability    O36.80X0  [redacted] weeks gestation of pregnancy                 Z3A.08 ---------------------------------------------------------------------- Fetal Evaluation  Num Of Fetuses:         1  Cardiac Activity:       Not visualized ---------------------------------------------------------------------- Biometry  CRL:       4.2  mm     G. Age:  6w 1d                   EDD:   11/20/21 ---------------------------------------------------------------------- OB History  Gravidity:    6         Term:   3         SAB:   2  Living:       3 ---------------------------------------------------------------------- Gestational Age  Clinical EDD:  8w 1d                                         EDD:   11/06/21  Best:          8w 1d      Det. By:  Clinical EDD             EDD:   11/06/21 ---------------------------------------------------------------------- Impression  Loss of Cardiac activity and no progression of size of  pregnancy since last u/s c/w missed AB ---------------------------------------------------------------------- Recommendations  Follow-up as clinically indicated. ----------------------------------------------------------------------  Tinnie Gensanya Pratt, MD Electronically Signed Final Report   03/30/2021 09:46 am ----------------------------------------------------------------------  US OB Limited  Result Date: 03/16/2021 ----------------------------------------------------------------------  OBSTETRICS REPORT                       (Signed Final 03/16/2021 07:57 am) ---------------------------------------------------------------------- Patient Info  ID #:       [id number]                          D.O.B.:  10-Mar-1986 (34 yrs)  Name:       Drema DallasSHANNON K Moger                Visit Date: 03/15/2021 01:11 pm ---------------------------------------------------------------------- Performed By  Attending:        Tinnie Gensanya Pratt MD         Ref. Address:     [address]                                                              [address]                                                             [place], [place]                                                             [id number]  Performed By:     Scheryl Martenhristine Soliz RN     [facility]                                                             [place]  Referred By:      Reva BoresANYA S PRATT                    MD ---------------------------------------------------------------------- Orders  #  Description  Code        Ordered By  1  US OB LIMITED                         [id number]     Tinnie Gens ----------------------------------------------------------------------  #  Order #                     Accession #                Episode #  1  [id number]                   [id number]                 [id number] ---------------------------------------------------------------------- Indications  Pregnancy with inconclusive fetal viability    O36.80X0 ---------------------------------------------------------------------- Fetal Evaluation  Num Of Fetuses:         1  Fetal Heart Rate(bpm):  92  Cardiac Activity:       Observed ---------------------------------------------------------------------- Comments  Technically limited exam due to early Gestational Age. CRL  0.43 at 6.[redacted] weeks gestation. LMP unknown. ---------------------------------------------------------------------- Impression  SIUP + flicker. FHR is low ---------------------------------------------------------------------- Recommendations  F/u u/s to verfiy viability in 7-10 days. ----------------------------------------------------------------------                   Tinnie Gens, MD Electronically Signed Final Report   03/16/2021 07:57 am ----------------------------------------------------------------------  US OB LESS THAN 14 WEEKS WITH OB TRANSVAGINAL  Result  Date: 04/03/2021 CLINICAL DATA:  Unsure of LMP. First trimester pregnancy with inconclusive fetal viability. EXAM: OBSTETRIC <14 WK [place] AND TRANSVAGINAL OB US TECHNIQUE: Both transabdominal and transvaginal ultrasound examinations were performed for complete evaluation of the gestation as well as the maternal uterus, adnexal regions, and pelvic cul-de-sac. Transvaginal technique was performed to assess early pregnancy. COMPARISON:  None. FINDINGS: Intrauterine gestational sac: Single Yolk sac:  Visualized. Embryo:  Visualized. Cardiac Activity: Not Visualized. CRL:  3 mm   5 w   6 d                  [place] EDC: 11/28/2021 Subchorionic hemorrhage:  None visualized. Maternal uterus/adnexae: Both ovaries are normal in appearance. No mass or abnormal free fluid identified. IMPRESSION: Findings are suspicious but not yet definitive for failed pregnancy. Recommend follow-up US in 5-7 days for definitive diagnosis. This recommendation follows SRU consensus guidelines: Diagnostic Criteria for Nonviable Pregnancy Early in the First Trimester. Malva Limes Med 2013; 102:5852-77. Electronically Signed   By: Danae Orleans M.D.   On: 04/03/2021 14:15    Assessment: Principal Problem:   Missed abortion   Plan: Patient will undergo surgical management with Dilation and Evacuation.  Risks of surgery including bleeding, infection, injury to surrounding organs, need for additional procedures, possibility of intrauterine scarring which may impair future fertility, risk of retained products which may require further management and other postoperative/anesthesia complications were explained to patient.  Likelihood of success of complete evacuation of the uterus was discussed with the patient. Routine postoperative instructions will be reviewed with the patient and her family in detail after surgery.  The patient concurred with the proposed plan, giving informed written consent for the surgery.  Patient has been NPO since last night and  she will remain NPO for procedure.  Anesthesia and OR aware.  Preoperative prophylactic Doxycycline 200mg  IV and SCDs ordered on call to the OR.  To OR when ready.  Of note, patient also  desired [name] evaluation of the products for genetic testing. This will be sent and will follow up results and manage accordingly.   Jaynie Collins, MD, FACOG Obstetrician & Gynecologist, [facility] for [employer], [facility]

## 2021-04-04 NOTE — Anesthesia Procedure Notes (Addendum)
Procedure Name: LMA Insertion Date/Time: 04/04/2021 2:38 PM Performed by: Francie Massing, CRNA Pre-anesthesia Checklist: Patient identified, Emergency Drugs available, Suction available and Patient being monitored Patient Re-evaluated:Patient Re-evaluated prior to induction Oxygen Delivery Method: Circle system utilized Preoxygenation: Pre-oxygenation with 100% oxygen Induction Type: IV induction Ventilation: Mask ventilation without difficulty LMA: LMA inserted LMA Size: 4.0 Number of attempts: 1 Airway Equipment and Method: Bite block Placement Confirmation: positive ETCO2 Tube secured with: Tape Dental Injury: Teeth and Oropharynx as per pre-operative assessment

## 2021-04-04 NOTE — Anesthesia Preprocedure Evaluation (Signed)
Anesthesia Evaluation  Patient identified by MRN, date of birth, ID band Patient awake    Reviewed: Allergy & Precautions, NPO status , Patient's Chart, lab work & pertinent test results  Airway Mallampati: II  TM Distance: >3 FB Neck ROM: Full    Dental no notable dental hx. (+) Dental Advisory Given, Teeth Intact   Pulmonary asthma , former smoker,    Pulmonary exam normal breath sounds clear to auscultation       Cardiovascular negative cardio ROS Normal cardiovascular exam Rhythm:Regular Rate:Normal     Neuro/Psych negative neurological ROS     GI/Hepatic negative GI ROS, Neg liver ROS,   Endo/Other  negative endocrine ROS  Renal/GU negative Renal ROS     Musculoskeletal negative musculoskeletal ROS (+)   Abdominal   Peds  Hematology negative hematology ROS (+)   Anesthesia Other Findings   Reproductive/Obstetrics                            Anesthesia Physical Anesthesia Plan  ASA: 2  Anesthesia Plan: General   Post-op Pain Management:    Induction: Intravenous  PONV Risk Score and Plan: 4 or greater and Ondansetron, Dexamethasone, Treatment may vary due to age or medical condition, Midazolam and Diphenhydramine  Airway Management Planned: LMA  Additional Equipment: None  Intra-op Plan:   Post-operative Plan: Extubation in OR  Informed Consent: I have reviewed the patients History and Physical, chart, labs and discussed the procedure including the risks, benefits and alternatives for the proposed anesthesia with the patient or authorized representative who has indicated his/her understanding and acceptance.     Dental advisory given  Plan Discussed with: CRNA  Anesthesia Plan Comments:        Anesthesia Quick Evaluation

## 2021-04-05 LAB — SURGICAL PATHOLOGY

## 2021-04-05 NOTE — Anesthesia Postprocedure Evaluation (Signed)
Anesthesia Post Note  Patient: Gloria Dean  Procedure(s) Performed: DILATATION AND EVACUATION with [name] testing (Vagina ) CHROMOSOME STUDIES (Vagina )     Patient location during evaluation: PACU Anesthesia Type: General Level of consciousness: awake and alert Pain management: pain level controlled Vital Signs Assessment: post-procedure vital signs reviewed and stable Respiratory status: spontaneous breathing, nonlabored ventilation and respiratory function stable Cardiovascular status: blood pressure returned to baseline and stable Postop Assessment: no apparent nausea or vomiting Anesthetic complications: no   No notable events documented.  Last Vitals:  Vitals:   04/04/21 1600 04/04/21 1628  BP: 119/72 113/75  Pulse: 71 71  Resp: 15 16  Temp:  36.6 C  SpO2: 100% 100%    Last Pain:  Vitals:   04/04/21 1628  TempSrc:   PainSc: 0-No pain                 Juvencio Verdi,W. EDMOND

## 2021-04-06 ENCOUNTER — Encounter (HOSPITAL_BASED_OUTPATIENT_CLINIC_OR_DEPARTMENT_OTHER): Payer: Self-pay | Admitting: Obstetrics & Gynecology

## 2021-04-19 ENCOUNTER — Encounter: Payer: 59 | Admitting: Obstetrics & Gynecology

## 2021-04-19 ENCOUNTER — Other Ambulatory Visit (HOSPITAL_COMMUNITY)
Admission: RE | Admit: 2021-04-19 | Discharge: 2021-04-19 | Disposition: A | Discharge status: Home / Self Care | Payer: 59 | Source: Ambulatory Visit | Attending: Obstetrics & Gynecology | Admitting: Obstetrics & Gynecology

## 2021-04-19 ENCOUNTER — Other Ambulatory Visit: Payer: Self-pay

## 2021-04-19 ENCOUNTER — Encounter: Payer: Self-pay | Admitting: Obstetrics & Gynecology

## 2021-04-19 ENCOUNTER — Ambulatory Visit (INDEPENDENT_AMBULATORY_CARE_PROVIDER_SITE_OTHER): Payer: 59 | Admitting: Obstetrics & Gynecology

## 2021-04-19 VITALS — BP 134/78 | HR 76 | Wt 144.2 lb

## 2021-04-19 DIAGNOSIS — Z5189 Encounter for other specified aftercare: Secondary | ICD-10-CM

## 2021-04-19 DIAGNOSIS — R87612 Low grade squamous intraepithelial lesion on cytologic smear of cervix (LGSIL): Secondary | ICD-10-CM | POA: Insufficient documentation

## 2021-04-19 DIAGNOSIS — O039 Complete or unspecified spontaneous abortion without complication: Secondary | ICD-10-CM

## 2021-04-19 DIAGNOSIS — Z3040 Encounter for surveillance of contraceptives, unspecified: Secondary | ICD-10-CM

## 2021-04-19 MED ORDER — NORETHIN ACE-ETH ESTRAD-FE 1-20 MG-MCG(24) PO TABS: 1.0000 | ORAL_TABLET | Freq: Every day | ORAL | 11 refills | Status: DC

## 2021-04-19 NOTE — Progress Notes (Signed)
   POSTOPERATIVE VISIT  Subjective:     Gloria Dean is a 35 y.o. [phone] female who presents to the clinic 2 weeks status post  Dilation and Evacuation for six missed missed abortion.   She and her husband are doing well, talking to friends and family, declines any further emotional help.  Eating a regular diet without difficulty. Bowel movements are normal. The patient is not having any pain or bleeding. Patient reports that her husband is getting a consultation for a vasectomy; they do not plan for any future pregnancies. She desires to restart OCPs for contraception in the interim, has used Loestrin 24 Fe in the past without any issues.   The following portions of the patient's history were reviewed and updated as appropriate: allergies, current medications, past family history, past medical history, past social history, past surgical history, and problem list. LGSIL pap with positive HRHPV on 06/23/2018 with benign colposcopy.  Review of Systems Pertinent items noted in HPI and remainder of comprehensive ROS otherwise negative.    Objective:    BP 134/78   Pulse 76   Wt 144 lb 3.2 oz (65.4 kg)   LMP  (LMP Unknown) Comment: 04-03-2021  missed ab  BMI 25.54 kg/m  General:  alert and no distress  Abdomen: soft, bowel sounds active, non-tender  Pelvic:  normal appearing external genitalia and urethral meatus; normal appearing vaginal mucosa and cervix, no abnormal discharge noted.  Pap smear obtained.  Done in presence of chaperone.     Assessment:    Doing well postoperatively. Pathology report discussed; Kizzie Fantasia report showed maternal contamination, the sample is being retested.    Plan:    1. LGSIL on Pap smear of cervix - Cytology - PAP done, will follow up results and manage accordingly.  2. Encounter for refill of prescription for contraception Loestrin 24 Fe refilled, advised to use back up  contraception for first 1-2 weeks.  Can stop once husband is cleared after  vasectomy. - Norethindrone Acetate-Ethinyl Estrad-FE (LOESTRIN 24 FE) 1-20 MG-MCG(24) tablet; Take 1 tablet by mouth daily.  Dispense: 28 tablet; Refill: 11  3. Follow-up visit after miscarriage Overall doing well, no concerns.  She was told to inform us of any concerns that may arise later.     Jaynie Collins, MD, FACOG Obstetrician & Gynecologist, [facility] for [employer], [facility]

## 2021-04-23 ENCOUNTER — Encounter: Payer: Self-pay | Admitting: *Deleted

## 2021-04-23 ENCOUNTER — Telehealth: Payer: Self-pay | Admitting: *Deleted

## 2021-04-23 NOTE — Telephone Encounter (Signed)
Pt informed of [name] resutls

## 2021-04-26 ENCOUNTER — Encounter: Payer: Self-pay | Admitting: Obstetrics & Gynecology

## 2021-04-26 DIAGNOSIS — R87613 High grade squamous intraepithelial lesion on cytologic smear of cervix (HGSIL): Secondary | ICD-10-CM | POA: Insufficient documentation

## 2021-04-26 LAB — CYTOLOGY - PAP
Comment: NEGATIVE
Comment: NEGATIVE
Diagnosis: HIGH — AB
HPV 16: NEGATIVE
HPV 18 / 45: NEGATIVE
High risk HPV: POSITIVE — AB

## 2021-05-01 ENCOUNTER — Telehealth: Payer: Self-pay | Admitting: *Deleted

## 2021-05-01 NOTE — Telephone Encounter (Signed)
Pt aware of pap results, denies any questions. Will schedule colpo.

## 2021-05-01 NOTE — Telephone Encounter (Signed)
-----   Message from Tereso Newcomer, MD sent at 04/26/2021  1:24 PM EDT ----- Please schedule patient for colposcopy for HGSIL pap done on 04/19/2021. Please call to inform patient of results and need for appointment.

## 2021-06-04 ENCOUNTER — Encounter: Payer: Self-pay | Admitting: Obstetrics & Gynecology

## 2021-06-04 ENCOUNTER — Ambulatory Visit (INDEPENDENT_AMBULATORY_CARE_PROVIDER_SITE_OTHER): Payer: 59 | Admitting: Obstetrics & Gynecology

## 2021-06-04 ENCOUNTER — Other Ambulatory Visit: Payer: Self-pay | Admitting: Obstetrics & Gynecology

## 2021-06-04 ENCOUNTER — Other Ambulatory Visit: Payer: Self-pay

## 2021-06-04 VITALS — BP 123/84 | HR 73

## 2021-06-04 DIAGNOSIS — R87613 High grade squamous intraepithelial lesion on cytologic smear of cervix (HGSIL): Secondary | ICD-10-CM

## 2021-06-04 LAB — POCT URINE PREGNANCY: Preg Test, Ur: NEGATIVE

## 2021-06-04 NOTE — Progress Notes (Signed)
    GYNECOLOGY OFFICE COLPOSCOPY PROCEDURE NOTE  35 y.o. [id number] here for colposcopy for high-grade squamous intraepithelial neoplasia  (HGSIL-encompassing moderate and severe dysplasia) pap smear on 04/19/2021. Discussed role for HPV in cervical dysplasia, need for surveillance.  Patient gave informed written consent, time out was performed.  Placed in lithotomy position. Cervix viewed with speculum and colposcope after application of acetic acid.   Colposcopy adequate? Yes Acetowhite lesion(s) noted at 12, 6 and 9 o'clock and mosaicism noted at 12 o'clock; corresponding biopsies obtained.  ECC specimen obtained. All specimens were labeled and sent to pathology.  Patient was given post procedure instructions.  Will follow up pathology and manage accordingly; patient will be contacted with results and recommendations.  Routine preventative health maintenance measures emphasized.    Jaynie Collins, MD, FACOG Obstetrician & Gynecologist, [facility] for [employer], [facility]

## 2021-06-04 NOTE — Patient Instructions (Signed)
Cervical cryotherapy is a procedure which involves freezing an area of abnormal tissue on the cervix. This tissue gradually disappears and the cervix heals. One cervical cryotherapy is usually sufficient to destroy the abnormal tissue.  Purpose  Cervical cryotherapy is a standard method used to treat cervical dysplasia, meaning the removal of abnormal cell tissue on the cervix.  Description  Cervical cryotherapy, or freezing, usually lasts about five minutes and causes a slight amount of discomfort. The procedure is usually performed in an outpatient setting.  Cervical cryotherapy is done by placing a small freeze-probe (cryoprobe) against the cervix that cools the cervix to sub-zero temperatures. The cells destroyed by freezing are shed afterwards in a heavy watery discharge. The main advantage of cryotherapy is that it is a simple procedure that requires inexpensive equipment.  The cryogenic device consists of a gas tank containing a refrigerant and non-explosive, non-toxic gas (usually nitrous oxide). The gas is delivered using flexible tubing through a gun-type attachment to the cryoprobe.  Diagnosis/Preparation  Women who undergo cervical cryotherapy typically have had an abnormal Pap smear which has led to a diagnosis of cervical squamous dysplasia and usually confirmed by biopsy after an adequate colposcopic exam.  Preparation for cervical cryotherapy involves scheduling the procedure when the patient is not experiencing heavy menstrual flow. Ibuprofen or naproxen sodium may be given before cryotherapy to decrease cramping. If there is any doubt about the pregnancy status, a pregnancy test is performed.  Aftercare  Cervical cryotherapy is often followed by a heavy and often odorous discharge during the first month after the procedure. The discharge is due to the dead tissue cells leaving the treatment site. The patient should abstain from sexual intercourse and not use tampons for a period of two  weeks after the procedure. Excessive exercise should also be avoided to lessen the occurrence of post-therapy bleeding.  Risks  The following risks have been associated with cervical cryotherapy:  Uterine cramping. Often occurs during the cryotherapy but rapidly subsides after treatment.  Bleeding and infection. Rare, but incidences have been reported.  More difficult Pap smears. Future Pap smears and colposcopy may be more difficult after cryotherapy.  Normal results  A normal result is no recurrence of the abnormal cervix cells. The first follow-up Pap smear is done at 6 months then 12 months after the procedure. If normal, patients resume usual pap smear screening.  If any, recurrences usually occur within two years of treatment.  If a follow-up Pap smear is abnormal, a colposcopy with biopsy is usually performed. Other treatment methods, usually the loop electrocautery excision procedure (LEEP) are then used if persistent disease is discovered.  Following the procedure, it is considered normal to experience the following:  slight cramping for two to three days  watery discharge requiring several pad changes daily  Bloody or brown discharge, especially 12-16 days after the procedure  Alternatives  Loop electrocautery excision procedure (LEEP). This procedure uses a fine wire loop with an electric current flowing through it to remove the desired area of the cervix. Loop excision is usually done under local anesthesia and causes very little discomfort.    

## 2021-06-07 ENCOUNTER — Telehealth: Payer: Self-pay | Admitting: *Deleted

## 2021-06-07 ENCOUNTER — Encounter: Payer: Self-pay | Admitting: Obstetrics & Gynecology

## 2021-06-07 DIAGNOSIS — Z87410 Personal history of cervical dysplasia: Secondary | ICD-10-CM | POA: Insufficient documentation

## 2021-06-07 DIAGNOSIS — D069 Carcinoma in situ of cervix, unspecified: Secondary | ICD-10-CM | POA: Insufficient documentation

## 2021-06-07 HISTORY — DX: Carcinoma in situ of cervix, unspecified: D06.9

## 2021-06-07 NOTE — Telephone Encounter (Signed)
Pt informed of colpo results and recommendation from Dr A to have the Leep procedure.  Informed pt that our machine is out being fixed currently and that I will be in touch and that we may have to schedule her at another location. Pt verbalizes and understands.

## 2021-06-07 NOTE — Progress Notes (Signed)
06/04/21 Colposcopy Pathology after HGSIL pap 1. Cervix, biopsy, 12 o'clock, 6 o'clock, 9 o'clock - HIGH-GRADE SQUAMOUS INTRAEPITHELIAL LESION (CIN2-3, HIGH GRADE DYSPLASIA) - LOW-GRADE SQUAMOUS INTRAEPITHELIAL LESION (CIN1, LOW GRADE DYSPLASIA) 2. Endocervix, curettage - MINUTE FRAGMENT OF DYSPLASTIC SQUAMOUS EPITHELIUM, SUSPICIOUS FOR HIGH-GRADE DYSPLASIA - BENIGN CERVICAL GLANDULAR MUCOSA  LEEP recommended given high grade dysplasia.  Please call to inform patient of results and recommendations.   Jaynie Collins, MD

## 2021-06-07 NOTE — Telephone Encounter (Signed)
-----   Message from Tereso Newcomer, MD sent at 06/07/2021  8:28 AM EDT ----- 06/04/21 Colposcopy Pathology after HGSIL pap 1. Cervix, biopsy, 12 o'clock, 6 o'clock, 9 o'clock - HIGH-GRADE SQUAMOUS INTRAEPITHELIAL LESION (CIN2-3, HIGH GRADE DYSPLASIA) - LOW-GRADE SQUAMOUS INTRAEPITHELIAL LESION (CIN1, LOW GRADE DYSPLASIA) 2. Endocervix, curettage - MINUTE FRAGMENT OF DYSPLASTIC SQUAMOUS EPITHELIUM, SUSPICIOUS FOR HIGH-GRADE DYSPLASIA - BENIGN CERVICAL GLANDULAR MUCOSA  LEEP recommended given high grade dysplasia.  Please call to inform patient of results and recommendations.   Jaynie Collins, MD

## 2021-06-27 ENCOUNTER — Telehealth: Payer: Self-pay

## 2021-06-27 NOTE — Telephone Encounter (Signed)
Left a message fo rher to call back to schedule her appt with dr a for her leep the machine is working now

## 2021-07-10 ENCOUNTER — Encounter: Payer: Self-pay | Admitting: Obstetrics & Gynecology

## 2021-07-10 ENCOUNTER — Ambulatory Visit (INDEPENDENT_AMBULATORY_CARE_PROVIDER_SITE_OTHER): Payer: 59 | Admitting: Obstetrics & Gynecology

## 2021-07-10 ENCOUNTER — Other Ambulatory Visit: Payer: Self-pay | Admitting: Obstetrics & Gynecology

## 2021-07-10 ENCOUNTER — Other Ambulatory Visit: Payer: Self-pay

## 2021-07-10 VITALS — BP 116/77 | HR 71

## 2021-07-10 DIAGNOSIS — R87613 High grade squamous intraepithelial lesion on cytologic smear of cervix (HGSIL): Secondary | ICD-10-CM | POA: Diagnosis not present

## 2021-07-10 DIAGNOSIS — D069 Carcinoma in situ of cervix, unspecified: Secondary | ICD-10-CM

## 2021-07-10 DIAGNOSIS — Z01812 Encounter for preprocedural laboratory examination: Secondary | ICD-10-CM | POA: Diagnosis not present

## 2021-07-10 LAB — POCT URINE PREGNANCY: Preg Test, Ur: NEGATIVE

## 2021-07-10 NOTE — Progress Notes (Signed)
   GYNECOLOGY OFFICE PROCEDURE NOTE  CHERINA DHILLON is a 35 y.o. [id number] here for LEEP. No GYN concerns. Pap smear and colposcopy history reviewed.    Pap HGSIL on 04/19/2021 Colpo Biopsy CIN 2-3  on 06/04/2021 ECC negative  Risks, benefits, alternatives, and limitations of procedure explained to patient, including pain, bleeding, infection, failure to remove abnormal tissue and failure to cure dysplasia, need for repeat procedures, damage to pelvic organs, cervical incompetence.  Role of HPV,cervical dysplasia and need for close followup was empasized. Informed written consent was obtained. All questions were answered. Time out performed. Urine pregnancy test was negative.  ??Procedure: The patient was placed in lithotomy position and the bivalved coated speculum was placed in the patient's vagina. A grounding pad placed on the patient. Lugol's solution was applied to the cervix and areas of decreased uptake were noted around the transformation zone.   Local anesthesia was administered via an intracervical block using 10 ml of 2% Lidocaine with epinephrine. The suction was turned on and the Large 1X Fisher Cone Biopsy Excisor on 75 Watts of blended current was used to excise the area of decreased uptake and excise the entire transformation zone. Excellent hemostasis was achieved using roller ball coagulation set at 60 Watts coagulation current. Monsel's solution was then applied and the speculum was removed from the vagina. Specimens were sent to pathology.  ?The patient tolerated the procedure well. Post-operative instructions given to patient, including instruction to seek medical attention for persistent bright red bleeding, fever, abdominal/pelvic pain, dysuria, nausea or vomiting. She was also told about the possibility of having copious yellow to black tinged discharge for weeks. She was counseled to avoid anything in the vagina (sex/douching/tampons) for 3 weeks. She has a 4 week post-operative  check to assess wound healing, review results and discuss further management.    Jaynie Collins, MD, FACOG Obstetrician & Gynecologist, [facility] for [employer], [facility]

## 2021-07-10 NOTE — Patient Instructions (Signed)
LEEP POST-PROCEDURE INSTRUCTIONS ? ?You may take Ibuprofen, Aleve or Tylenol for pain if needed.  Cramping is normal. ? ?You will have black and/or bloody discharge at first.  This will lighten and then turn clear before completely resolving.  This will take 2 to 3 weeks. ? ?Put nothing in your vagina until the bleeding or discharge stops (usually 2 or 3 weeks). ? ?You need to call if you have redness around the biopsy site, if there is any unusual draining, if the bleeding is heavy, or if you are concerned. ? ?Shower or bathe as normal ? ?We will call you within one week with results or we will discuss the results at your follow-up appointment if needed. ? ?You will need to return for a follow-up Pap smear as directed by your physician. ?

## 2021-07-16 ENCOUNTER — Telehealth: Payer: Self-pay | Admitting: *Deleted

## 2021-07-16 NOTE — Telephone Encounter (Signed)
Pt informed of results and needing repeat pap in 86months

## 2021-07-16 NOTE — Telephone Encounter (Signed)
-----   Message from Tereso Newcomer, MD sent at 07/15/2021  1:30 PM EDT ----- 07/10/2021 LEEP pathology Cervix, LEEP, start of incision is at 12 o'clock - HIGH GRADE SQUAMOUS INTRAEPITHELIAL LESION (CIN-II TO III, MODERATE TO SEVERE SQUAMOUS DYSPLASIA). - LOW GRADE SQUAMOUS INTRAEPITHELIAL LESION (CIN-I, MILD SQUAMOUS DYSPLASIA WITH HPV EFFECT). - SURGICAL RESECTION MARGINS APPEAR FREE. Will recheck pap and HPV test in 6 months.  Please call to inform patient of results and recommendations. Also released results to MyChart

## 2021-08-09 ENCOUNTER — Ambulatory Visit: Payer: 59 | Admitting: Obstetrics & Gynecology

## 2021-08-09 ENCOUNTER — Other Ambulatory Visit: Payer: Self-pay

## 2021-08-09 ENCOUNTER — Encounter: Payer: Self-pay | Admitting: Obstetrics & Gynecology

## 2021-08-09 VITALS — BP 115/81 | HR 74 | Wt 144.0 lb

## 2021-08-09 DIAGNOSIS — N760 Acute vaginitis: Secondary | ICD-10-CM

## 2021-08-09 DIAGNOSIS — Z9889 Other specified postprocedural states: Secondary | ICD-10-CM | POA: Diagnosis not present

## 2021-08-09 DIAGNOSIS — B9689 Other specified bacterial agents as the cause of diseases classified elsewhere: Secondary | ICD-10-CM | POA: Diagnosis not present

## 2021-08-09 NOTE — Progress Notes (Signed)
   GYNECOLOGY OFFICE VISIT NOTE   Subjective:   Gloria Dean is a 35 y.o.  [phone] female who presents to the office 4 weeks status post  LEEP  for CIN 2-3. Reports malodorous vaginal discharge noted for a few days.   The patient is not having any pain.  No other concerns.  The following portions of the patient's history were reviewed and updated as appropriate: allergies, current medications, past family history, past medical history, past social history, past surgical history, and problem list.  Review of Systems Pertinent items noted in HPI and remainder of comprehensive ROS otherwise negative.    Objective:   BP 115/81   Pulse 74   Wt 144 lb (65.3 kg)   BMI 25.51 kg/m  General:  alert and no distress  Abdomen: soft, bowel sounds active, non-tender  Pelvic:   healing well, yellow malodorous vaginal discharge seen, no erythema, done in the presence of a chaperone   07/10/2021 LEEP pathology Cervix, LEEP, start of incision is at 12 o'clock - HIGH GRADE SQUAMOUS INTRAEPITHELIAL LESION (CIN-II TO III, MODERATE TO SEVERE SQUAMOUS DYSPLASIA). - LOW GRADE SQUAMOUS INTRAEPITHELIAL LESION (CIN-I, MILD SQUAMOUS DYSPLASIA WITH HPV EFFECT). - SURGICAL RESECTION MARGINS APPEAR FREE.    Assessment:   Doing well postoperatively, has vaginal discharge concerning for BV. Operative findings again reviewed. Pathology report discussed.    Plan:    Sample of Solosec given for BV treatment, advised on how to take this one dose therapy. Will need to repeat pap and HPV testing in 6 months.    I spent 15 minutes dedicated to the care of this patient including pre-visit review of records, face to face time with the patient discussing her conditions and treatments and post visit ordering of testing.  Jaynie Collins, MD, FACOG Obstetrician & Gynecologist, [facility] for [employer], [facility]

## 2021-08-09 NOTE — Progress Notes (Signed)
RGYN patient presents for F/U LEEP today.  pt notes slight discharge no longer bleeding.   Unsure LMP

## 2021-10-07 NOTE — L&D Delivery Note (Signed)
OB/GYN Faculty Practice Delivery Note  GEET HOSKING is a 36 y.o. K2H0623 s/p SVD at [redacted]w[redacted]d. She was admitted for IOL for gHTN and AMA.   ROM: 1h 77m with clear fluid GBS Status:  Negative/-- (09/25 1600) Maximum Maternal Temperature:  Temp (24hrs), Avg:97.9 F (36.6 C), Min:97.8 F (36.6 C), Max:98 F (36.7 C)    Labor Progress: Patient arrived at 1 cm dilation and was induced with cytotec and pitocin.   Delivery Date/Time: 07/23/2022 at 1311 Delivery: Called to room and patient was complete and pushing. Head delivered in direct OA position. No nuchal cord present. Shoulder and body delivered in usual fashion. Infant with spontaneous cry, placed on mother's abdomen, dried and stimulated. Cord clamped x 2 after 1-minute delay, and cut by FOB. Cord blood drawn. Placenta delivered spontaneously with gentle cord traction. Fundus firm with massage and Pitocin. Labia, perineum, vagina, and cervix inspected with no lacerations.   Placenta: Spontaneos, intact, 3 vessel cord  Complications: None Lacerations: None EBL: 57 ml  Analgesia: None   Infant: APGAR (1 MIN):  9 APGAR (5 MINS):  9 APGAR (10 MINS):    Weight: Pending   Gifford Shave, MD  07/23/2022 1:23 PM

## 2021-11-21 ENCOUNTER — Other Ambulatory Visit: Payer: Self-pay | Admitting: *Deleted

## 2021-11-21 MED ORDER — PROGESTERONE 200 MG PO CAPS: 200.0000 mg | ORAL_CAPSULE | Freq: Every day | ORAL | 3 refills | Status: DC

## 2021-12-05 ENCOUNTER — Ambulatory Visit (INDEPENDENT_AMBULATORY_CARE_PROVIDER_SITE_OTHER): Payer: 59

## 2021-12-05 ENCOUNTER — Ambulatory Visit (INDEPENDENT_AMBULATORY_CARE_PROVIDER_SITE_OTHER): Payer: 59 | Admitting: *Deleted

## 2021-12-05 ENCOUNTER — Other Ambulatory Visit: Payer: Self-pay

## 2021-12-05 VITALS — BP 122/78 | HR 96 | Wt 143.0 lb

## 2021-12-05 DIAGNOSIS — O0990 Supervision of high risk pregnancy, unspecified, unspecified trimester: Secondary | ICD-10-CM | POA: Insufficient documentation

## 2021-12-05 DIAGNOSIS — O3680X0 Pregnancy with inconclusive fetal viability, not applicable or unspecified: Secondary | ICD-10-CM

## 2021-12-05 DIAGNOSIS — Z8759 Personal history of other complications of pregnancy, childbirth and the puerperium: Secondary | ICD-10-CM

## 2021-12-05 NOTE — Progress Notes (Signed)
Patient seen and assessed by nursing staff.  Agree with documentation and plan.  

## 2021-12-05 NOTE — Progress Notes (Cosign Needed)
New OB Intake ? ?I explained I am completing viability scan today. We discussed her EDD of 07/25/2022 that is based on LMP of 10/18/2021. Pt is G6/P3. I reviewed her allergies, medications, Medical/Surgical/OB history, and appropriate screenings.   ? ?Patient Active Problem List  ? Diagnosis Date Noted  ? Severe dysplasia of cervix (CIN III) 06/07/2021  ? HGSIL on Pap smear of cervix on 04/19/21 04/26/2021  ? Missed abortion 03/28/2021  ? LGSIL on Pap smear of cervix 09/05/2011  ? Asthma with allergic rhinitis 08/07/2011  ? ? ?Concerns addressed today ? ? ?Placed OB Box on problem list and updated ? ? ?Patient informed that the ultrasound is considered a limited obstetric ultrasound and is not intended to be a complete ultrasound exam.  Patient also informed that the ultrasound is not being completed with the intent of assessing for fetal or placental anomalies or any pelvic abnormalities. Explained that the purpose of today's ultrasound is to assess for dating and fetal heart rate.  Patient acknowledges the purpose of the exam and the limitations of the study.    ? ? First visit review ? ?Due to patients history of previous SAB's, we will have pt come back in 2 weeks to repeat scan, and then New OB scheduled 2 weeks after next scan.  ? ?Crosby Oyster, RN ?12/05/2021  9:32 AM  ?

## 2021-12-19 ENCOUNTER — Ambulatory Visit (INDEPENDENT_AMBULATORY_CARE_PROVIDER_SITE_OTHER): Payer: 59

## 2021-12-19 ENCOUNTER — Other Ambulatory Visit: Payer: Self-pay

## 2021-12-19 ENCOUNTER — Ambulatory Visit (INDEPENDENT_AMBULATORY_CARE_PROVIDER_SITE_OTHER): Payer: 59 | Admitting: *Deleted

## 2021-12-19 VITALS — BP 127/79 | HR 101

## 2021-12-19 DIAGNOSIS — O0990 Supervision of high risk pregnancy, unspecified, unspecified trimester: Secondary | ICD-10-CM

## 2021-12-19 DIAGNOSIS — Z3A08 8 weeks gestation of pregnancy: Secondary | ICD-10-CM | POA: Diagnosis not present

## 2021-12-19 NOTE — Progress Notes (Cosign Needed Addendum)
Pt here for repeat scan due to hx of SAB's. Denies any issues or concerns at this time.  ? ?Patient informed that the ultrasound is considered a limited obstetric ultrasound and is not intended to be a complete ultrasound exam.  Patient also informed that the ultrasound is not being completed with the intent of assessing for fetal or placental anomalies or any pelvic abnormalities. Explained that the purpose of today's ultrasound is to assess for fetal heart rate.  Patient acknowledges the purpose of the exam and the limitations of the study.    ? ? ?[place] today consistent with previous dating scan. FHR 176. Pt to follow up in 2 weeks for New OB visit.  ? ?Scheryl Marten, RN  ?

## 2021-12-20 NOTE — Progress Notes (Signed)
Patient seen and assessed by nursing staff.  Agree with documentation and plan.  

## 2022-01-01 ENCOUNTER — Other Ambulatory Visit (HOSPITAL_COMMUNITY)
Admission: RE | Admit: 2022-01-01 | Discharge: 2022-01-01 | Disposition: A | Discharge status: Home / Self Care | Payer: 59 | Source: Ambulatory Visit | Attending: Obstetrics & Gynecology | Admitting: Obstetrics & Gynecology

## 2022-01-01 ENCOUNTER — Other Ambulatory Visit: Payer: Self-pay

## 2022-01-01 ENCOUNTER — Ambulatory Visit (INDEPENDENT_AMBULATORY_CARE_PROVIDER_SITE_OTHER): Payer: 59 | Admitting: Obstetrics & Gynecology

## 2022-01-01 ENCOUNTER — Encounter: Payer: Self-pay | Admitting: Obstetrics & Gynecology

## 2022-01-01 VITALS — BP 125/78 | HR 76 | Wt 146.6 lb

## 2022-01-01 DIAGNOSIS — O0990 Supervision of high risk pregnancy, unspecified, unspecified trimester: Secondary | ICD-10-CM | POA: Insufficient documentation

## 2022-01-01 DIAGNOSIS — Z3A10 10 weeks gestation of pregnancy: Secondary | ICD-10-CM

## 2022-01-01 DIAGNOSIS — O09529 Supervision of elderly multigravida, unspecified trimester: Secondary | ICD-10-CM | POA: Insufficient documentation

## 2022-01-01 DIAGNOSIS — O2620 Pregnancy care for patient with recurrent pregnancy loss, unspecified trimester: Secondary | ICD-10-CM | POA: Insufficient documentation

## 2022-01-01 DIAGNOSIS — O09521 Supervision of elderly multigravida, first trimester: Secondary | ICD-10-CM

## 2022-01-01 DIAGNOSIS — D069 Carcinoma in situ of cervix, unspecified: Secondary | ICD-10-CM

## 2022-01-01 NOTE — Addendum Note (Signed)
Addended by: Jaynie Collins A on: 01/01/2022 04:50 PM ? ? Modules accepted: Orders ? ?

## 2022-01-01 NOTE — Progress Notes (Signed)
? ?History:  ? Gloria Dean is a 35 y.o. [id number] at [redacted]w[redacted]d by LMP, early ultrasound being seen today for her first obstetrical visit.  Her obstetrical history is significant for  three SABs prior to this pregnancy with negative evaluation for recurrent miscarriages, three previous term uncomplicated vaginal deliveries . Patient does intend to breast feed. Pregnancy history fully reviewed. ? ?Patient reports no complaints. Accompanied by her husband. ?  ? ?  ?HISTORY: ?OB History  ?Gravida Para Term Preterm AB Living  ?7 3 3  0 3 3  ?SAB IAB Ectopic Multiple Live Births  ?3 0 0 0 3  ?  ?# Outcome Date GA Lbr Len/2nd Weight Sex Delivery Anes PTL Lv  ?7 Current           ?6 SAB 04/03/21 [redacted]w[redacted]d         ?5 SAB 01/2021          ?4 SAB 04/2020          ?3 Term 09/16/19 [redacted]w[redacted]d 05:10 / 00:04 8 lb 5.3 oz (3.779 kg) M Vag-Spont None  LIV  ?   Name: LEESA, LEIFHEIT  ?   Apgar1: 8  Apgar5: 9  ?2 Term 11/15/13 [redacted]w[redacted]d 17:45 / 00:02 6 lb 6 oz (2.892 kg) F Vag-Spont None  LIV  ?   Name: GRENDA, LORA  ?   Apgar1: 9  Apgar5: 9  ?1 Term 02/28/12 [redacted]w[redacted]d 09:35 / 00:14 5 lb 6.8 oz (2.46 kg) M Vag-Spont None  LIV  ?   Name: AYASHA, ELLINGSEN  ?   Apgar1: 8  Apgar5: 9  ?History of LEEP in 07/2021 for CIN 3. ? ?Past Medical History:  ?Diagnosis Date  ? Asthma with allergic rhinitis 08/07/2011  ? History of 2019 novel coronavirus disease (COVID-19) 10/2020  ? per pt mild symptoms that resolved  ? History of abnormal cervical Pap smear 2010  ? History of asthma   ? per pt as teen seasonal asthma but no issue since  ? History of chlamydia   ? History of genital warts 12/17/2011  ? Treated with TCA   ? History of pregnancy induced hypertension   ? preeclampsia  ? History of recurrent miscarriages   ? LGSIL on Pap smear of cervix 09/05/2011  ? Negative colpo--for repeat with HPV typing this pregnancy-Same pap--will repeat colpo--now wit LGSIL-->colpo--benign biopsy--repeat in 1 year.  ? Wears contact lenses   ? ?Past Surgical History:   ?Procedure Laterality Date  ? DILATION AND EVACUATION N/A 04/04/2021  ? Procedure: DILATATION AND EVACUATION with [name] testing;  Surgeon: 04/06/2021, MD;  Location: [facility];  Service: Gynecology;  Laterality: N/A;  [name] testing  ? WISDOM TOOTH EXTRACTION    ? teen  ? ?Family History  ?Problem Relation Age of Onset  ? Cancer Maternal Grandfather   ?     lung  ? ?Social History  ? ?Tobacco Use  ? Smoking status: Former  ?  Years: 10.00  ?  Types: Cigarettes  ?  Quit date: 2012  ?  Years since quitting: 11.2  ? Smokeless tobacco: Never  ? Tobacco comments:  ?  socially  ?Vaping Use  ? Vaping Use: Never used  ?Substance Use Topics  ? Alcohol use: Not Currently  ?  Comment: occasional  ? Drug use: Never  ? ?No Known Allergies ?Current Outpatient Medications on File Prior to Visit  ?Medication Sig Dispense Refill  ? acetaminophen (TYLENOL) 500 MG tablet Take 500 mg  by mouth every 6 (six) hours as needed.    ? Prenatal Vit-Fe Fumarate-FA (MULTIVITAMIN-PRENATAL) 27-0.8 MG TABS tablet Take 1 tablet by mouth daily at 12 noon.    ? progesterone (PROMETRIUM) 200 MG capsule Take 1 capsule (200 mg total) by mouth daily. 30 capsule 3  ? amoxicillin (AMOXIL) 500 MG capsule Take by mouth. (Patient not taking: Reported on 01/01/2022)    ? ?No current facility-administered medications on file prior to visit.  ? ? ?Review of Systems ?Pertinent items noted in HPI and remainder of comprehensive ROS otherwise negative. ? ?Physical Exam:  ? ?Vitals:  ? 01/01/22 1539  ?BP: 125/78  ?Pulse: 76  ?Weight: 146 lb 9.6 oz (66.5 kg)  ? ?Fetal Heart Rate (bpm): 178   ?General: well-developed, well-nourished female in no acute distress  ?Breasts:  deferred  ?Skin: normal coloration and turgor, no rashes  ?Neurologic: oriented, normal, negative, normal mood  ?Extremities: normal strength, tone, and muscle mass, ROM of all joints is normal  ?HEENT PERRLA, extraocular movement intact and sclera clear, anicteric  ?Neck supple  and no masses  ?Cardiovascular: regular rate and rhythm  ?Respiratory:  no respiratory distress, normal breath sounds  ?Abdomen: soft, non-tender; bowel sounds normal; no masses,  no organomegaly  ?Pelvic: deferred   ?  ?Assessment:  ?  ?Pregnancy: [id number] ?Patient Active Problem List  ? Diagnosis Date Noted  ? Previous recurrent miscarriages affecting pregnancy, antepartum 01/01/2022  ? Supervision of high risk pregnancy, antepartum 12/05/2021  ? Severe dysplasia of cervix (CIN III) 06/07/2021  ? HGSIL on Pap smear of cervix on 04/19/21 04/26/2021  ? ?  ?Plan:  ?  ?1. Previous recurrent miscarriages affecting pregnancy, antepartum ?Continue close monitoring ? ?2. Severe dysplasia of cervix (CIN III) ?S/P LEEP, cervical length to be checked during anatomy scan (discussed over phone with Dr. Judeth Cornfield in MFM, only done earlier if patient has history of preterm deliveries) ? ?3. [redacted] weeks gestation of pregnancy ?4. Multigravida of advanced maternal age in first trimester ?5. Supervision of high risk pregnancy, antepartum ?- CBC/D/Plt+RPR+Rh+ABO+RubIgG... ?- Culture, OB Urine ?- Genetic Screening ?- GC/Chlamydia probe amp ([place])not at [facility] ?- [place] MFM OB DETAIL +14 WK; Future ?Initial labs drawn. ?Continue prenatal vitamins. ?Problem list reviewed and updated. ?Genetic Screening discussed, NIPS and Horizon: ordered. ?Ultrasound discussed; fetal anatomic survey: ordered. ?Anticipatory guidance about prenatal visits given including labs, ultrasounds, and testing. ?Discussed usage of the Babyscripts app for more information about pregnancy, and to track blood pressures. ?Also discussed usage of virtual visits as additional source of managing and completing prenatal visits.   ?Patient was encouraged to use MyChart to review results, send requests, and have questions addressed.   ?The nature of  - Center for [facility] Healthcare/Faculty Practice with multiple MDs and Advanced Practice Providers was explained to  patient; also emphasized that residents, students are part of our team. ?Routine obstetric precautions reviewed. Encouraged to seek out care at office or emergency room [facility] preferred) for urgent and/or emergent concerns. ?Return in about 4 weeks (around 01/29/2022) for OFFICE OB VISIT (MD only) and pap smear.  ?  ? ?Jaynie Collins, MD, FACOG ?Obstetrician Heritage manager, Faculty Practice ?Center for [employer], [facility] ? ? ?

## 2022-01-02 ENCOUNTER — Encounter: Payer: Self-pay | Admitting: Obstetrics & Gynecology

## 2022-01-02 ENCOUNTER — Encounter: Payer: 59 | Admitting: Family Medicine

## 2022-01-02 LAB — CBC/D/PLT+RPR+RH+ABO+RUBIGG...
Antibody Screen: NEGATIVE
Basophils Absolute: 0.1 10*3/uL (ref 0.0–0.2)
Basos: 1 %
EOS (ABSOLUTE): 0.4 10*3/uL (ref 0.0–0.4)
Eos: 4 %
HCV Ab: NONREACTIVE
HIV Screen 4th Generation wRfx: NONREACTIVE
Hematocrit: 36.6 % (ref 34.0–46.6)
Hemoglobin: 12.4 g/dL (ref 11.1–15.9)
Hepatitis B Surface Ag: NEGATIVE
Immature Grans (Abs): 0 10*3/uL (ref 0.0–0.1)
Immature Granulocytes: 0 %
Lymphocytes Absolute: 2.3 10*3/uL (ref 0.7–3.1)
Lymphs: 24 %
MCH: 29.2 pg (ref 26.6–33.0)
MCHC: 33.9 g/dL (ref 31.5–35.7)
MCV: 86 fL (ref 79–97)
Monocytes Absolute: 0.5 10*3/uL (ref 0.1–0.9)
Monocytes: 5 %
Neutrophils Absolute: 6.2 10*3/uL (ref 1.4–7.0)
Neutrophils: 66 %
Platelets: 295 10*3/uL (ref 150–450)
RBC: 4.25 x10E6/uL (ref 3.77–5.28)
RDW: 13.6 % (ref 11.7–15.4)
RPR Ser Ql: NONREACTIVE
Rh Factor: NEGATIVE
Rubella Antibodies, IGG: 4.65 index (ref >0.99)
WBC: 9.4 10*3/uL (ref 3.4–10.8)

## 2022-01-02 LAB — HCV INTERPRETATION

## 2022-01-03 LAB — URINE CULTURE, OB REFLEX

## 2022-01-03 LAB — CULTURE, OB URINE

## 2022-01-03 LAB — GC/CHLAMYDIA PROBE AMP (~~LOC~~) NOT AT ARMC
Chlamydia: NEGATIVE
Comment: NEGATIVE
Comment: NORMAL
Neisseria Gonorrhea: NEGATIVE

## 2022-01-08 ENCOUNTER — Telehealth: Payer: Self-pay | Admitting: *Deleted

## 2022-01-08 ENCOUNTER — Encounter: Payer: Self-pay | Admitting: *Deleted

## 2022-01-08 NOTE — Telephone Encounter (Signed)
Pt informed of panorama results, pt will look at fetal sex later with husband. ?

## 2022-01-08 NOTE — Telephone Encounter (Signed)
Left message for pt to call back regarding her panorma results ?

## 2022-01-15 ENCOUNTER — Encounter: Payer: Self-pay | Admitting: *Deleted

## 2022-01-16 ENCOUNTER — Encounter: Payer: 59 | Admitting: Family Medicine

## 2022-01-21 ENCOUNTER — Encounter: Payer: Self-pay | Admitting: Obstetrics & Gynecology

## 2022-01-24 ENCOUNTER — Encounter (INDEPENDENT_AMBULATORY_CARE_PROVIDER_SITE_OTHER): Payer: Self-pay | Admitting: *Deleted

## 2022-01-31 ENCOUNTER — Ambulatory Visit (INDEPENDENT_AMBULATORY_CARE_PROVIDER_SITE_OTHER): Payer: 59 | Admitting: Obstetrics & Gynecology

## 2022-01-31 ENCOUNTER — Other Ambulatory Visit (HOSPITAL_COMMUNITY)
Admission: RE | Admit: 2022-01-31 | Discharge: 2022-01-31 | Disposition: A | Discharge status: Home / Self Care | Payer: 59 | Source: Ambulatory Visit | Attending: Obstetrics & Gynecology | Admitting: Obstetrics & Gynecology

## 2022-01-31 ENCOUNTER — Encounter: Payer: Self-pay | Admitting: Obstetrics & Gynecology

## 2022-01-31 VITALS — BP 107/69 | HR 82 | Wt 146.0 lb

## 2022-01-31 DIAGNOSIS — Z3A15 15 weeks gestation of pregnancy: Secondary | ICD-10-CM

## 2022-01-31 DIAGNOSIS — O0990 Supervision of high risk pregnancy, unspecified, unspecified trimester: Secondary | ICD-10-CM | POA: Diagnosis present

## 2022-01-31 DIAGNOSIS — D069 Carcinoma in situ of cervix, unspecified: Secondary | ICD-10-CM

## 2022-01-31 DIAGNOSIS — O09522 Supervision of elderly multigravida, second trimester: Secondary | ICD-10-CM

## 2022-01-31 NOTE — Progress Notes (Signed)
? ?  PRENATAL VISIT NOTE ? ?Subjective:  ?Gloria Dean is a 36 y.o. [phone] at [redacted]w[redacted]d being seen today for ongoing prenatal care.  She is currently monitored for the following issues for this high-risk pregnancy and has Rh negative state in antepartum period; HGSIL on Pap smear of cervix on 04/19/21; Severe dysplasia of cervix (CIN III); Supervision of high risk pregnancy, antepartum; Previous recurrent miscarriages affecting pregnancy, antepartum; and AMA (advanced maternal age) multigravida 35+ on their problem list. ? ?Patient reports no complaints.  Contractions: Not present. Vag. Bleeding: None.   . Denies leaking of fluid.  ? ?The following portions of the patient's history were reviewed and updated as appropriate: allergies, current medications, past family history, past medical history, past social history, past surgical history and problem list.  ? ?Objective:  ? ?Vitals:  ? 01/31/22 1552  ?BP: 107/69  ?Pulse: 82  ?Weight: 146 lb (66.2 kg)  ? ? ?Fetal Status: Fetal Heart Rate (bpm): 167        ? ?General:  Alert, oriented and cooperative. Patient is in no acute distress.  ?Skin: Skin is warm and dry. No rash noted.   ?Cardiovascular: Normal heart rate noted  ?Respiratory: Normal respiratory effort, no problems with respiration noted  ?Abdomen: Soft, gravid, appropriate for gestational age.  Pain/Pressure: Absent     ?Pelvic: Cervical exam performed in the presence of a chaperone. Closed cervix noted, pap smear done        ?Extremities: Normal range of motion.  Edema: None  ?Mental Status: Normal mood and affect. Normal behavior. Normal judgment and thought content.  ? ?Assessment and Plan:  ?Pregnancy: [id number] at [redacted]w[redacted]d ?1. Multigravida of advanced maternal age in second trimester ?Low risk NIPS. ? ?2. Severe dysplasia of cervix (CIN III) ?3. [redacted] weeks gestation of pregnancy ?4. Supervision of high risk pregnancy, antepartum ?- Cytology - PAP done today, will follow up results and manage  accordingly. ?Offered AFP, she declined this. Already scheduled for anatomy scan. ?No other complaints or concerns.  Routine obstetric precautions reviewed. ? ?Please refer to After Visit Summary for other counseling recommendations.  ? ?Return for OFFICE OB VISIT (MD or APP). ? ?Future Appointments  ?Date Time Provider [place]  ?02/28/2022  8:55 AM Terence Googe, Sallyanne Havers, MD [facility]  ?02/28/2022 11:15 AM [facility] NURSE [facility]  ?02/28/2022 11:30 AM [facility] US2 [facility]  ?03/26/2022  4:10 PM Brand Siever, Sallyanne Havers, MD [facility]  ? ? ?Verita Schneiders, MD ? ?

## 2022-02-07 LAB — CYTOLOGY - PAP
Comment: NEGATIVE
Diagnosis: NEGATIVE
Diagnosis: REACTIVE
High risk HPV: NEGATIVE

## 2022-02-28 ENCOUNTER — Ambulatory Visit (INDEPENDENT_AMBULATORY_CARE_PROVIDER_SITE_OTHER): Payer: 59 | Admitting: Obstetrics & Gynecology

## 2022-02-28 ENCOUNTER — Encounter: Payer: Self-pay | Admitting: Obstetrics & Gynecology

## 2022-02-28 ENCOUNTER — Other Ambulatory Visit: Payer: Self-pay | Admitting: *Deleted

## 2022-02-28 ENCOUNTER — Ambulatory Visit: Payer: 59 | Attending: Obstetrics & Gynecology

## 2022-02-28 ENCOUNTER — Encounter: Payer: Self-pay | Admitting: *Deleted

## 2022-02-28 ENCOUNTER — Ambulatory Visit: Payer: 59 | Admitting: *Deleted

## 2022-02-28 VITALS — BP 117/73 | HR 80 | Wt 151.2 lb

## 2022-02-28 VITALS — BP 116/67 | HR 77

## 2022-02-28 DIAGNOSIS — O09521 Supervision of elderly multigravida, first trimester: Secondary | ICD-10-CM | POA: Insufficient documentation

## 2022-02-28 DIAGNOSIS — O0990 Supervision of high risk pregnancy, unspecified, unspecified trimester: Secondary | ICD-10-CM | POA: Insufficient documentation

## 2022-02-28 DIAGNOSIS — O09522 Supervision of elderly multigravida, second trimester: Secondary | ICD-10-CM

## 2022-02-28 DIAGNOSIS — Z3A19 19 weeks gestation of pregnancy: Secondary | ICD-10-CM

## 2022-02-28 DIAGNOSIS — D069 Carcinoma in situ of cervix, unspecified: Secondary | ICD-10-CM | POA: Diagnosis present

## 2022-02-28 DIAGNOSIS — O2620 Pregnancy care for patient with recurrent pregnancy loss, unspecified trimester: Secondary | ICD-10-CM

## 2022-02-28 DIAGNOSIS — Z9889 Other specified postprocedural states: Secondary | ICD-10-CM | POA: Insufficient documentation

## 2022-02-28 DIAGNOSIS — O3440 Maternal care for other abnormalities of cervix, unspecified trimester: Secondary | ICD-10-CM

## 2022-02-28 NOTE — Progress Notes (Signed)
   PRENATAL VISIT NOTE  Subjective:  Gloria Dean is a 36 y.o. [phone] at [redacted]w[redacted]d being seen today for ongoing prenatal care.  She is currently monitored for the following issues for this high-risk pregnancy and has Rh negative state in antepartum period; Severe dysplasia of cervix (CIN III); Supervision of high risk pregnancy, antepartum; Previous recurrent miscarriages affecting pregnancy, antepartum; and AMA (advanced maternal age) multigravida 35+ on their problem list.  Patient reports no complaints.  Contractions: Not present. Vag. Bleeding: None.  Movement: Present. Denies leaking of fluid.   The following portions of the patient's history were reviewed and updated as appropriate: allergies, current medications, past family history, past medical history, past social history, past surgical history and problem list.   Objective:   Vitals:   02/28/22 0909  BP: 117/73  Pulse: 80  Weight: 151 lb 3.2 oz (68.6 kg)    Fetal Status: Fetal Heart Rate (bpm): 150   Movement: Present     General:  Alert, oriented and cooperative. Patient is in no acute distress.  Skin: Skin is warm and dry. No rash noted.   Cardiovascular: Normal heart rate noted  Respiratory: Normal respiratory effort, no problems with respiration noted  Abdomen: Soft, gravid, appropriate for gestational age.  Pain/Pressure: Present     Pelvic: Cervical exam deferred        Extremities: Normal range of motion.  Edema: None  Mental Status: Normal mood and affect. Normal behavior. Normal judgment and thought content.   Assessment and Plan:  Pregnancy: [id number] at [redacted]w[redacted]d 1. Multigravida of advanced maternal age in second trimester 2. [redacted] weeks gestation of pregnancy 3. Supervision of high risk pregnancy, antepartum Anatomy scan today, will follow up results and manage accordingly. Declined AFP scan. Preterm labor symptoms and general obstetric precautions including but not limited to vaginal bleeding, contractions, leaking  of fluid and fetal movement were reviewed in detail with the patient. Please refer to After Visit Summary for other counseling recommendations.   Return in about 4 weeks (around 03/28/2022) for OFFICE OB VISIT (MD only).  Future Appointments  Date Time Provider [place]  02/28/2022 11:15 AM [facility] NURSE [facility]  02/28/2022 11:30 AM [facility] US2 [facility]  03/26/2022  4:10 PM Jahayra Mazo, Sallyanne Havers, MD [facility]    Verita Schneiders, MD

## 2022-03-26 ENCOUNTER — Ambulatory Visit (INDEPENDENT_AMBULATORY_CARE_PROVIDER_SITE_OTHER): Payer: 59 | Admitting: Obstetrics & Gynecology

## 2022-03-26 ENCOUNTER — Encounter: Payer: Self-pay | Admitting: Obstetrics & Gynecology

## 2022-03-26 VITALS — BP 118/76 | HR 91 | Wt 157.0 lb

## 2022-03-26 DIAGNOSIS — Z3A22 22 weeks gestation of pregnancy: Secondary | ICD-10-CM

## 2022-03-26 DIAGNOSIS — O0992 Supervision of high risk pregnancy, unspecified, second trimester: Secondary | ICD-10-CM

## 2022-03-26 DIAGNOSIS — O0990 Supervision of high risk pregnancy, unspecified, unspecified trimester: Secondary | ICD-10-CM

## 2022-03-26 DIAGNOSIS — O09522 Supervision of elderly multigravida, second trimester: Secondary | ICD-10-CM

## 2022-03-26 NOTE — Progress Notes (Signed)
PRENATAL VISIT NOTE  Subjective:  Gloria Dean is a 36 y.o. [phone] at [redacted]w[redacted]d being seen today for ongoing prenatal care.  She is currently monitored for the following issues for this high-risk pregnancy and has Rh negative state in antepartum period; History of severe cervical dysplasia (CIN III); Supervision of high risk pregnancy, antepartum; Previous recurrent miscarriages affecting pregnancy, antepartum; AMA (advanced maternal age) multigravida 35+; and History of LEEP (loop electrosurgical excision procedure) of cervix complicating pregnancy on their problem list.  Patient reports no complaints.  Contractions: Irritability. Vag. Bleeding: None.  Movement: Present. Denies leaking of fluid.   The following portions of the patient's history were reviewed and updated as appropriate: allergies, current medications, past family history, past medical history, past social history, past surgical history and problem list.   Objective:   Vitals:   03/26/22 1617  BP: 118/76  Pulse: 91  Weight: 157 lb (71.2 kg)    Fetal Status: Fetal Heart Rate (bpm): 152   Movement: Present     General:  Alert, oriented and cooperative. Patient is in no acute distress.  Skin: Skin is warm and dry. No rash noted.   Cardiovascular: Normal heart rate noted  Respiratory: Normal respiratory effort, no problems with respiration noted  Abdomen: Soft, gravid, appropriate for gestational age.  Pain/Pressure: Absent     Pelvic: Cervical exam deferred        Extremities: Normal range of motion.  Edema: None  Mental Status: Normal mood and affect. Normal behavior. Normal judgment and thought content.   [place] MFM OB DETAIL +14 WK  Result Date: 02/28/2022 ----------------------------------------------------------------------  OBSTETRICS REPORT                       (Signed Final 02/28/2022 01:20 pm) ---------------------------------------------------------------------- Patient Info  ID #:       [id number]                           D.O.B.:  10-Mar-1986 (35 yrs)  Name:       Gloria Dean                Visit Date: 02/28/2022 12:06 pm ---------------------------------------------------------------------- Performed By  Attending:        Johnell Comings MD         Ref. Address:     [place]  Performed By:     Ladora Daniel            [facility]                    BA,[facility]                                  [facility]  Referred By:      [facility] ---------------------------------------------------------------------- Orders  #  Description                           Code        Ordered By  1  [place] MFM OB DETAIL +14 [place]               [id number]    Verita Schneiders  2  [place] MFM OB TRANSVAGINAL                [id number]     Verita Schneiders ----------------------------------------------------------------------  #  Order #                     Accession #                Episode #  1  [id number]                   [id number]                 [id number]  2  [id number]                   [id number]                 [id number] ---------------------------------------------------------------------- Indications  Advanced maternal age multigravida 17,         O09.522  second trimester  Previous cervical surgery (LEEP)               O34.40  Poor obstetric history: Previous gestational   O33.299  HTN  [redacted] weeks gestation of pregnancy                Z3A.19  Antenatal screening for malformations          Z36.3  Rh negative state in antepartum                O36.0190  Low risk NIPS/neg Horizon 4  Encounter for cervical length                  Z36.86 ---------------------------------------------------------------------- Fetal Evaluation  Num Of Fetuses:         1  Fetal Heart Rate(bpm):  157  Cardiac Activity:       Observed  Presentation:            Cephalic  Placenta:               Posterior  P. Cord Insertion:      Visualized  Amniotic Fluid  AFI FV:      Within normal limits                              Largest Pocket(cm)                              4.5 ---------------------------------------------------------------------- Biometry  BPD:      43.8  mm  G. Age:  19w 2d         62  %    CI:        76.98   %    70 - 86                                                          FL/HC:      17.4   %    16.1 - 18.3  HC:      158.1  mm     G. Age:  18w 5d         27  %    HC/AC:      1.08        1.09 - 1.39  AC:       147   mm     G. Age:  20w 0d         78  %    FL/BPD:     62.8   %  FL:       27.5  mm     G. Age:  18w 3d         23  %    FL/AC:      18.7   %    20 - 24  LV:        5.7  mm  Est. FW:     279  gm    0 lb 10 oz      57  % ---------------------------------------------------------------------- OB History  Gravidity:    7         Term:   3        Prem:   0        SAB:   3  TOP:          0       Ectopic:  0        Living: 3 ---------------------------------------------------------------------- Gestational Age  LMP:           19w 0d        Date:  10/18/21                 EDD:   07/25/22  U/S Today:     19w 1d                                        EDD:   07/24/22  Best:          19w 0d     Det. By:  LMP  (10/18/21)          EDD:   07/25/22 ---------------------------------------------------------------------- Anatomy  Cranium:               Appears normal         LVOT:                   Appears normal  Cavum:                 Appears normal         Aortic Arch:            Appears normal  Ventricles:  Appears normal         Ductal Arch:            Appears normal  Choroid Plexus:        Appears normal         Diaphragm:              Appears normal  Cerebellum:            Appears normal         Stomach:                Appears normal, left                                                                        sided  Posterior Fossa:        Appears normal         Abdomen:                Appears normal  Nuchal Fold:           Appears normal         Abdominal Wall:         Appears nml (cord                                                                        insert, abd wall)  Face:                  Appears normal         Cord Vessels:           Appears normal (3                         (orbits and profile)                           vessel cord)  Lips:                  Appears normal         Kidneys:                Appear normal  Palate:                Appears normal         Bladder:                Appears normal  Thoracic:              Appears normal         Spine:                  Appears normal  Heart:                 Appears normal         Upper Extremities:      Appears normal                         (  4CH, axis, and                         situs)  RVOT:                  Appears normal         Lower Extremities:      Appears normal  Other:  Fetus appears to be a female. Heels/feet, open hands, nasal bone,          lenses, maxilla, mandible, 3VV, 3VTV and VC visualized. ---------------------------------------------------------------------- Cervix Uterus Adnexa  Cervix  Length:            3.4  cm.  Normal appearance by transvaginal scan  Uterus  No abnormality visualized.  Right Ovary  Within normal limits.  Left Ovary  Within normal limits.  Cul De Sac  No free fluid seen.  Adnexa  No abnormality visualized. ---------------------------------------------------------------------- Comments  This patient was seen for a detailed fetal anatomy scan due  to advanced maternal age.  She has a prior LEEP procedure.  She denies any significant past medical history and denies  any problems in her current pregnancy.  She had a cell free DNA test earlier in her pregnancy which  indicated a low risk for trisomy 57, 23, and 13. A female fetus is  predicted.  She was informed that the fetal growth and amniotic fluid  level were appropriate for her gestational age.   There were no obvious fetal anomalies noted on today's  ultrasound exam.  The patient was informed that anomalies may be missed due  to technical limitations. If the fetus is in a suboptimal position  or maternal habitus is increased, visualization of the fetus in  the maternal uterus may be impaired.  Due to her history of a LEEP, a transvaginal ultrasound  performed today showed a cervical length of 3.4 cm long  without any signs of funneling.  The patient was reassured  that based on the normal cervical length visualized today, her  risk of a preterm birth is low.  The increased risk of fetal aneuploidy due to advanced  maternal age was discussed. Due to advanced maternal age,  the patient was offered and declined an amniocentesis today  for definitive diagnosis of fetal aneuploidy.  She is  comfortable with her negative cell free DNA test.  Due to advanced maternal age, a follow-up growth scan was  scheduled in 6 weeks. ----------------------------------------------------------------------                   Johnell Comings, MD Electronically Signed Final Report   02/28/2022 01:20 pm ----------------------------------------------------------------------  [place] MFM OB TRANSVAGINAL  Result Date: 02/28/2022 ----------------------------------------------------------------------  OBSTETRICS REPORT                       (Signed Final 02/28/2022 01:20 pm) ---------------------------------------------------------------------- Patient Info  ID #:       [id number]                          D.O.B.:  27-Jan-1986 (35 yrs)  Name:       Gloria Dean                Visit Date: 02/28/2022 12:06 pm ---------------------------------------------------------------------- Performed By  Attending:        Johnell Comings MD         Ref. Address:     [address]  Performed By:     Aundra Millet            Location:         [facility]                                   [facility] at                                                             [facility]  Referred By:      [facility] ---------------------------------------------------------------------- Orders  #  Description                           Code        Ordered By  1  [place] MFM OB DETAIL +14 WK               [id number]    Jaynie Collins  2  [place] MFM OB TRANSVAGINAL                16109.6     Jaynie Collins ----------------------------------------------------------------------  #  Order #                     Accession #                Episode #  1  [id number]                   [id number]                 [id number]  2  [id number]                   [id number]                 [id number] ---------------------------------------------------------------------- Indications  Advanced maternal age multigravida 76,         O09.522  second trimester  Previous cervical surgery (LEEP)               O34.40  Poor obstetric history: Previous gestational   O59.299  HTN  [redacted] weeks gestation of pregnancy                Z3A.19  Antenatal screening for malformations          Z36.3  Rh negative state in antepartum                O36.0190  Low risk NIPS/neg Horizon 4  Encounter for cervical length                  Z36.86 ---------------------------------------------------------------------- Fetal Evaluation  Num Of Fetuses:         1  Fetal Heart Rate(bpm):  157  Cardiac Activity:       Observed  Presentation:           Cephalic  Placenta:               Posterior  P. Cord Insertion:      Visualized  Amniotic Fluid  AFI FV:      Within normal limits                              Largest Pocket(cm)                              4.5 ---------------------------------------------------------------------- Biometry  BPD:      43.8  mm     G. Age:  19w 2d         62  %    CI:        76.98   %    70 - 86                                                          FL/HC:       17.4   %    16.1 - 18.3  HC:      158.1  mm     G. Age:  18w 5d         27  %    HC/AC:      1.08        1.09 - 1.39  AC:       147   mm     G. Age:  20w 0d         78  %    FL/BPD:     62.8   %  FL:       27.5  mm     G. Age:  18w 3d         23  %    FL/AC:      18.7   %    20 - 24  LV:        5.7  mm  Est. FW:     279  gm    0 lb 10 oz      57  % ---------------------------------------------------------------------- OB History  Gravidity:    7         Term:   3        Prem:   0        SAB:   3  TOP:          0       Ectopic:  0        Living: 3 ---------------------------------------------------------------------- Gestational Age  LMP:           19w 0d  Date:  10/18/21                 EDD:   07/25/22  U/S Today:     19w 1d                                        EDD:   07/24/22  Best:          19w 0d     Det. By:  LMP  (10/18/21)          EDD:   07/25/22 ---------------------------------------------------------------------- Anatomy  Cranium:               Appears normal         LVOT:                   Appears normal  Cavum:                 Appears normal         Aortic Arch:            Appears normal  Ventricles:            Appears normal         Ductal Arch:            Appears normal  Choroid Plexus:        Appears normal         Diaphragm:              Appears normal  Cerebellum:            Appears normal         Stomach:                Appears normal, left                                                                        sided  Posterior Fossa:       Appears normal         Abdomen:                Appears normal  Nuchal Fold:           Appears normal         Abdominal Wall:         Appears nml (cord                                                                        insert, abd wall)  Face:                  Appears normal         Cord Vessels:           Appears normal (3                         (  orbits and profile)                           vessel cord)  Lips:                  Appears normal          Kidneys:                Appear normal  Palate:                Appears normal         Bladder:                Appears normal  Thoracic:              Appears normal         Spine:                  Appears normal  Heart:                 Appears normal         Upper Extremities:      Appears normal                         (4CH, axis, and                         situs)  RVOT:                  Appears normal         Lower Extremities:      Appears normal  Other:  Fetus appears to be a female. Heels/feet, open hands, nasal bone,          lenses, maxilla, mandible, 3VV, 3VTV and VC visualized. ---------------------------------------------------------------------- Cervix Uterus Adnexa  Cervix  Length:            3.4  cm.  Normal appearance by transvaginal scan  Uterus  No abnormality visualized.  Right Ovary  Within normal limits.  Left Ovary  Within normal limits.  Cul De Sac  No free fluid seen.  Adnexa  No abnormality visualized. ---------------------------------------------------------------------- Comments  This patient was seen for a detailed fetal anatomy scan due  to advanced maternal age.  She has a prior LEEP procedure.  She denies any significant past medical history and denies  any problems in her current pregnancy.  She had a cell free DNA test earlier in her pregnancy which  indicated a low risk for trisomy 14, 28, and 13. A female fetus is  predicted.  She was informed that the fetal growth and amniotic fluid  level were appropriate for her gestational age.  There were no obvious fetal anomalies noted on today's  ultrasound exam.  The patient was informed that anomalies may be missed due  to technical limitations. If the fetus is in a suboptimal position  or maternal habitus is increased, visualization of the fetus in  the maternal uterus may be impaired.  Due to her history of a LEEP, a transvaginal ultrasound  performed today showed a cervical length of 3.4 cm long  without any signs of funneling.  The patient  was reassured  that based on the normal cervical length visualized today, her  risk of a preterm birth is low.  The increased risk of fetal aneuploidy due to advanced  maternal age was discussed. Due to advanced maternal age,  the patient was offered and declined an amniocentesis today  for definitive diagnosis of fetal aneuploidy.  She is  comfortable with her negative cell free DNA test.  Due to advanced maternal age, a follow-up growth scan was  scheduled in 6 weeks. ----------------------------------------------------------------------                   Ma Rings, MD Electronically Signed Final Report   02/28/2022 01:20 pm ----------------------------------------------------------------------   Assessment and Plan:  Pregnancy: [id number] at [redacted]w[redacted]d 1. [redacted] weeks gestation of pregnancy 2. Multigravida of advanced maternal age in second trimester 3. Supervision of high risk pregnancy, antepartum Follow up scan scheduled. Preterm labor symptoms and general obstetric precautions including but not limited to vaginal bleeding, contractions, leaking of fluid and fetal movement were reviewed in detail with the patient. Please refer to After Visit Summary for other counseling recommendations.   Return in about 3 weeks (around 04/16/2022) for OFFICE OB VISIT (MD only)  6 weeks from now: 2 hr GTT, third trimester labs, OB visit.  Future Appointments  Date Time Provider Department Center  04/11/2022  3:30 PM [facility] NURSE [facility]  04/11/2022  3:45 PM [facility] US4 [facility]    Jaynie Collins, MD

## 2022-04-10 ENCOUNTER — Other Ambulatory Visit: Payer: Self-pay | Admitting: Obstetrics & Gynecology

## 2022-04-11 ENCOUNTER — Ambulatory Visit: Payer: 59 | Admitting: *Deleted

## 2022-04-11 ENCOUNTER — Ambulatory Visit: Payer: 59 | Attending: Obstetrics

## 2022-04-11 VITALS — BP 112/63 | HR 76

## 2022-04-11 DIAGNOSIS — O3442 Maternal care for other abnormalities of cervix, second trimester: Secondary | ICD-10-CM | POA: Diagnosis not present

## 2022-04-11 DIAGNOSIS — Z9889 Other specified postprocedural states: Secondary | ICD-10-CM | POA: Insufficient documentation

## 2022-04-11 DIAGNOSIS — O360120 Maternal care for anti-D [Rh] antibodies, second trimester, not applicable or unspecified: Secondary | ICD-10-CM

## 2022-04-11 DIAGNOSIS — O09292 Supervision of pregnancy with other poor reproductive or obstetric history, second trimester: Secondary | ICD-10-CM

## 2022-04-11 DIAGNOSIS — O09522 Supervision of elderly multigravida, second trimester: Secondary | ICD-10-CM

## 2022-04-11 DIAGNOSIS — O2620 Pregnancy care for patient with recurrent pregnancy loss, unspecified trimester: Secondary | ICD-10-CM | POA: Diagnosis present

## 2022-04-11 DIAGNOSIS — O0990 Supervision of high risk pregnancy, unspecified, unspecified trimester: Secondary | ICD-10-CM

## 2022-04-11 DIAGNOSIS — O3440 Maternal care for other abnormalities of cervix, unspecified trimester: Secondary | ICD-10-CM | POA: Diagnosis present

## 2022-04-11 DIAGNOSIS — Z3A25 25 weeks gestation of pregnancy: Secondary | ICD-10-CM

## 2022-04-12 ENCOUNTER — Other Ambulatory Visit: Payer: Self-pay | Admitting: *Deleted

## 2022-04-12 DIAGNOSIS — O09522 Supervision of elderly multigravida, second trimester: Secondary | ICD-10-CM

## 2022-04-16 ENCOUNTER — Ambulatory Visit (INDEPENDENT_AMBULATORY_CARE_PROVIDER_SITE_OTHER): Payer: 59 | Admitting: Obstetrics & Gynecology

## 2022-04-16 VITALS — BP 112/71 | HR 92 | Wt 161.8 lb

## 2022-04-16 DIAGNOSIS — Z6791 Unspecified blood type, Rh negative: Secondary | ICD-10-CM

## 2022-04-16 DIAGNOSIS — O26899 Other specified pregnancy related conditions, unspecified trimester: Secondary | ICD-10-CM

## 2022-04-16 DIAGNOSIS — Z3A25 25 weeks gestation of pregnancy: Secondary | ICD-10-CM

## 2022-04-16 DIAGNOSIS — O0990 Supervision of high risk pregnancy, unspecified, unspecified trimester: Secondary | ICD-10-CM

## 2022-04-16 DIAGNOSIS — O09522 Supervision of elderly multigravida, second trimester: Secondary | ICD-10-CM

## 2022-04-16 NOTE — Progress Notes (Signed)
Pt presents for ROB. FHT 138- no concerns today.

## 2022-04-16 NOTE — Progress Notes (Signed)
PRENATAL VISIT NOTE  Subjective:  Gloria Dean is a 35 y.o. [phone] at [redacted]w[redacted]d being seen today for ongoing prenatal care.  She is currently monitored for the following issues for this high-risk pregnancy and has Rh negative state in antepartum period; History of severe cervical dysplasia (CIN III); Supervision of high risk pregnancy, antepartum; Previous recurrent miscarriages affecting pregnancy, antepartum; AMA (advanced maternal age) multigravida 35+; and History of LEEP (loop electrosurgical excision procedure) of cervix complicating pregnancy on their problem list.  Patient reports no complaints.  Contractions: Irritability. Vag. Bleeding: None.  Movement: Present. Denies leaking of fluid.   The following portions of the patient's history were reviewed and updated as appropriate: allergies, current medications, past family history, past medical history, past social history, past surgical history and problem list.   Objective:   Vitals:   04/16/22 1538  BP: 112/71  Pulse: 92  Weight: 161 lb 12.8 oz (73.4 kg)    Fetal Status: Fetal Heart Rate (bpm): 138   Movement: Present     General:  Alert, oriented and cooperative. Patient is in no acute distress.  Skin: Skin is warm and dry. No rash noted.   Cardiovascular: Normal heart rate noted  Respiratory: Normal respiratory effort, no problems with respiration noted  Abdomen: Soft, gravid, appropriate for gestational age.  Pain/Pressure: Absent     Pelvic: Cervical exam deferred        Extremities: Normal range of motion.  Edema: None  Mental Status: Normal mood and affect. Normal behavior. Normal judgment and thought content.   [place] MFM OB FOLLOW UP  Result Date: 04/11/2022 ----------------------------------------------------------------------  OBSTETRICS REPORT                       (Signed Final 04/11/2022 05:11 pm) ---------------------------------------------------------------------- Patient Info  ID #:       [id number]                           D.O.B.:  1986/02/10 (35 yrs)  Name:       Gloria Dean                Visit Date: 04/11/2022 04:42 pm ---------------------------------------------------------------------- Performed By  Attending:        Ma Rings MD         Ref. Address:     [address]                                                             [address]  Performed By:     Emeline Darling BS,      [facility]  Referred By:      [facility] ---------------------------------------------------------------------- Orders  #  Description                           Code        Ordered By  1  [place] MFM OB FOLLOW UP                   [id number]    Peterson Ao ----------------------------------------------------------------------  #  Order #                     Accession #                Episode #  [id number]  [id number]                   [id number]                 [id number] ---------------------------------------------------------------------- Indications  Advanced maternal age multigravida 37,         O44.522  second trimester  [redacted] weeks gestation of pregnancy                Z3A.25  Previous cervical surgery (LEEP)               O34.40  Poor obstetric history: Previous gestational   O6.299  HTN  Rh negative state in antepartum                O36.0190  Low risk NIPS/neg Horizon 4 ---------------------------------------------------------------------- Fetal Evaluation  Num Of Fetuses:         1  Fetal Heart Rate(bpm):  157  Cardiac Activity:       Observed  Presentation:           Breech  Placenta:               Posterior  P. Cord Insertion:      Previously Visualized  Amniotic Fluid  AFI FV:      Within normal limits                              Largest Pocket(cm)                              5.48  ---------------------------------------------------------------------- Biometry  BPD:     62.71  mm     G. Age:  25w 3d         57  %    CI:        77.07   %    70 - 86                                                          FL/HC:      19.9   %  18.7 - 20.3  HC:    226.21   mm     G. Age:  24w 5d         18  %    HC/AC:      1.06        1.04 - 1.22  AC:    214.22   mm     G. Age:  25w 6d         70  %    FL/BPD:     71.8   %    71 - 87  FL:      45.04  mm     G. Age:  24w 6d         32  %    FL/AC:      21.0   %    20 - 24  Est. FW:     805  gm    1 lb 12 oz      58  % ---------------------------------------------------------------------- OB History  Gravidity:    7         Term:   3        Prem:   0        SAB:   3  TOP:          0       Ectopic:  0        Living: 3 ---------------------------------------------------------------------- Gestational Age  LMP:           25w 0d        Date:  10/18/21                  EDD:   07/25/22  U/S Today:     25w 2d                                        EDD:   07/23/22  Best:          25w 0d     Det. By:  LMP  (10/18/21)          EDD:   07/25/22 ---------------------------------------------------------------------- Anatomy  Cranium:               Appears normal         LVOT:                   Previously seen  Cavum:                 Appears normal         Aortic Arch:            Previously seen  Ventricles:            Appears normal         Ductal Arch:            Previously seen  Choroid Plexus:        Previously seen        Diaphragm:              Appears normal  Cerebellum:            Appears normal         Stomach:                Appears normal, left  sided  Posterior Fossa:       Previously seen        Abdomen:                Appears normal  Nuchal Fold:           Previously seen        Abdominal Wall:         Previously seen  Face:                  Orbits and profile     Cord Vessels:            Previously seen                         previously seen  Lips:                  Previously seen        Kidneys:                Appear normal  Palate:                Previously seen        Bladder:                Appears normal  Thoracic:              Appears normal         Spine:                  Previously seen  Heart:                 Appears normal         Upper Extremities:      Previously seen                         (4CH, axis, and                         situs)  RVOT:                  Previously seen        Lower Extremities:      Previously seen  Other:  Fetus appears to be a female. Heels/feet, open hands, nasal bone,          lenses, maxilla, mandible, 3VV, 3VTV and VC previously visualized. ---------------------------------------------------------------------- Cervix Uterus Adnexa  Cervix  Not visualized (advanced GA >24wks) ---------------------------------------------------------------------- Comments  This patient was seen for a follow up growth scan due to  advanced maternal age.  She denies any problems since her  last exam.  She was informed that the fetal growth and amniotic fluid  level appears appropriate for her gestational age.  Due to advanced maternal age, a follow-up exam was  scheduled in 7 weeks. ----------------------------------------------------------------------                  Johnell Comings, MD Electronically Signed Final Report   04/11/2022 05:11 pm ----------------------------------------------------------------------   Assessment and Plan:  Pregnancy: [id number] at [redacted]w[redacted]d 1. Multigravida of advanced maternal age in second trimester Low risk NIPS. Growth scans as per MFM.   2. Rh negative state in antepartum period Rhogam to be given around 28 weeks  3. [redacted] weeks gestation of pregnancy 4. Supervision of high risk pregnancy, antepartum Labs next visit. Preterm labor symptoms  and general obstetric precautions including but not limited to vaginal bleeding, contractions, leaking of  fluid and fetal movement were reviewed in detail with the patient. Please refer to After Visit Summary for other counseling recommendations.   Return in about 3 weeks (around 05/07/2022) for 2 hr GTT, 3rd trimester labs, TDap, Rhogam, OFFICE OB VISIT (MD only).  Future Appointments  Date Time Provider Department Center  05/07/2022  8:15 AM [facility] LAB [facility]  05/07/2022  9:55 AM Geraline Halberstadt, Jethro Bastos, MD [facility]  05/21/2022  4:10 PM [place] Bing, MD [facility]  05/30/2022  3:15 PM [facility] NURSE [facility]  05/30/2022  3:30 PM [facility] US3 [facility]  06/04/2022  4:10 PM Marlow Berenguer, Jethro Bastos, MD [facility]    Jaynie Collins, MD

## 2022-04-16 NOTE — Patient Instructions (Signed)
Return to office for any scheduled appointments. Call the office or go to the MAU at [facility] if: You begin to have strong, frequent contractions Your water breaks.  Sometimes it is a big gush of fluid, sometimes it is just a trickle that keeps getting your underwear wet or running down your legs You have vaginal bleeding.  It is normal to have a small amount of spotting if your cervix was checked.  You do not feel your baby moving like normal.  If you do not, get something to eat and drink and lay down and focus on feeling your baby move.   If your baby is still not moving like normal, you should call the office or go to MAU. Any other obstetric concerns.   TDaP Vaccine Pregnancy Get the Whooping Cough Vaccine While You Are Pregnant (CDC)  It is important for women to get the whooping cough vaccine in the third trimester of each pregnancy. Vaccines are the best way to prevent this disease. There are 2 different whooping cough vaccines. Both vaccines combine protection against whooping cough, tetanus and diphtheria, but they are for different age groups: Tdap: for everyone 11 years or older, including pregnant women  DTaP: for children 2 months through 6 years of age  You need the whooping cough vaccine during each of your pregnancies The recommended time to get the shot is during your 27th through 36th week of pregnancy, preferably during the earlier part of this time period. The Centers for Disease Control and Prevention (CDC) recommends that pregnant women receive the whooping cough vaccine for adolescents and adults (called Tdap vaccine) during the third trimester of each pregnancy. The recommended time to get the shot is during your 27th through 36th week of pregnancy, preferably during the earlier part of this time period. This replaces the original recommendation that pregnant women get the vaccine only if they had not previously received it. The American  College of Obstetricians and Gynecologists and the American College of Nurse-Midwives support this recommendation.  You should get the whooping cough vaccine while pregnant to pass protection to your baby frame support disabled and/or not supported in this browser  Learn why Gloria Dean decided to get the whooping cough vaccine in her 3rd trimester of pregnancy and how her baby girl was born with some protection against the disease. Also available on YouTube. After receiving the whooping cough vaccine, your body will create protective antibodies (proteins produced by the body to fight off diseases) and pass some of them to your baby before birth. These antibodies provide your baby some short-term protection against whooping cough in early life. These antibodies can also protect your baby from some of the more serious complications that come along with whooping cough. Your protective antibodies are at their highest about 2 weeks after getting the vaccine, but it takes time to pass them to your baby. So the preferred time to get the whooping cough vaccine is early in your third trimester. The amount of whooping cough antibodies in your body decreases over time. That is why CDC recommends you get a whooping cough vaccine during each pregnancy. Doing so allows each of your babies to get the greatest number of protective antibodies from you. This means each of your babies will get the best protection possible against this disease.  Getting the whooping cough vaccine while pregnant is better than getting the vaccine after you give birth Whooping cough vaccination during pregnancy is ideal so   your baby will have short-term protection as soon as he is born. This early protection is important because your baby will not start getting his whooping cough vaccines until he is 2 months old. These first few months of life are when your baby is at greatest risk for catching whooping cough. This is also when he's at greatest  risk for having severe, potentially life-threating complications from the infection. To avoid that gap in protection, it is best to get a whooping cough vaccine during pregnancy. You will then pass protection to your baby before he is born. To continue protecting your baby, he should get whooping cough vaccines starting at 2 months old. You may never have gotten the Tdap vaccine before and did not get it during this pregnancy. If so, you should make sure to get the vaccine immediately after you give birth, before leaving the hospital or birthing center. It will take about 2 weeks before your body develops protection (antibodies) in response to the vaccine. Once you have protection from the vaccine, you are less likely to give whooping cough to your newborn while caring for him. But remember, your baby will still be at risk for catching whooping cough from others. A recent study looked to see how effective Tdap was at preventing whooping cough in babies whose mothers got the vaccine while pregnant or in the hospital after giving birth. The study found that getting Tdap between 27 through 36 weeks of pregnancy is 85% more effective at preventing whooping cough in babies younger than 2 months old. Blood tests cannot tell if you need a whooping cough vaccine There are no blood tests that can tell you if you have enough antibodies in your body to protect yourself or your baby against whooping cough. Even if you have been sick with whooping cough in the past or previously received the vaccine, you still should get the vaccine during each pregnancy. Breastfeeding may pass some protective antibodies onto your baby By breastfeeding, you may pass some antibodies you have made in response to the vaccine to your baby. When you get a whooping cough vaccine during your pregnancy, you will have antibodies in your breast milk that you can share with your baby as soon as your milk comes in. However, your baby will not get  protective antibodies immediately if you wait to get the whooping cough vaccine until after delivering your baby. This is because it takes about 2 weeks for your body to create antibodies. Learn more about the health benefits of breastfeeding.  

## 2022-05-07 ENCOUNTER — Other Ambulatory Visit: Payer: 59

## 2022-05-07 ENCOUNTER — Ambulatory Visit (INDEPENDENT_AMBULATORY_CARE_PROVIDER_SITE_OTHER): Payer: 59 | Admitting: Obstetrics & Gynecology

## 2022-05-07 VITALS — BP 109/72 | HR 82 | Wt 166.0 lb

## 2022-05-07 DIAGNOSIS — O0990 Supervision of high risk pregnancy, unspecified, unspecified trimester: Secondary | ICD-10-CM

## 2022-05-07 DIAGNOSIS — O360930 Maternal care for other rhesus isoimmunization, third trimester, not applicable or unspecified: Secondary | ICD-10-CM | POA: Diagnosis not present

## 2022-05-07 DIAGNOSIS — O09523 Supervision of elderly multigravida, third trimester: Secondary | ICD-10-CM

## 2022-05-07 DIAGNOSIS — Z3A28 28 weeks gestation of pregnancy: Secondary | ICD-10-CM | POA: Diagnosis not present

## 2022-05-07 DIAGNOSIS — Z23 Encounter for immunization: Secondary | ICD-10-CM

## 2022-05-07 DIAGNOSIS — Z6791 Unspecified blood type, Rh negative: Secondary | ICD-10-CM | POA: Diagnosis not present

## 2022-05-07 DIAGNOSIS — O0993 Supervision of high risk pregnancy, unspecified, third trimester: Secondary | ICD-10-CM

## 2022-05-07 DIAGNOSIS — O99013 Anemia complicating pregnancy, third trimester: Secondary | ICD-10-CM

## 2022-05-07 MED ORDER — RHO D IMMUNE GLOBULIN 1500 UNIT/2ML IJ SOSY
300.0000 ug | PREFILLED_SYRINGE | Freq: Once | INTRAMUSCULAR | Status: AC
  Administered 2022-05-07: 300 ug via INTRAMUSCULAR

## 2022-05-07 NOTE — Progress Notes (Signed)
   PRENATAL VISIT NOTE  Subjective:  Gloria Dean is a 36 y.o. [phone] at [redacted]w[redacted]d being seen today for ongoing prenatal care.  She is currently monitored for the following issues for this high-risk pregnancy and has Rh negative state in antepartum period; History of severe cervical dysplasia (CIN III); Supervision of high risk pregnancy, antepartum; Previous recurrent miscarriages affecting pregnancy, antepartum; AMA (advanced maternal age) multigravida 35+; and History of LEEP (loop electrosurgical excision procedure) of cervix complicating pregnancy on their problem list.  Patient reports no complaints.  Contractions: Not present. Vag. Bleeding: None.  Movement: Present. Denies leaking of fluid.   The following portions of the patient's history were reviewed and updated as appropriate: allergies, current medications, past family history, past medical history, past social history, past surgical history and problem list.   Objective:   Vitals:   05/07/22 0817  BP: 109/72  Pulse: 82  Weight: 166 lb (75.3 kg)    Fetal Status: Fetal Heart Rate (bpm): 148 Fundal Height: 29 cm Movement: Present     General:  Alert, oriented and cooperative. Patient is in no acute distress.  Skin: Skin is warm and dry. No rash noted.   Cardiovascular: Normal heart rate noted  Respiratory: Normal respiratory effort, no problems with respiration noted  Abdomen: Soft, gravid, appropriate for gestational age.  Pain/Pressure: Present     Pelvic: Cervical exam deferred        Extremities: Normal range of motion.  Edema: Trace  Mental Status: Normal mood and affect. Normal behavior. Normal judgment and thought content.   Assessment and Plan:  Pregnancy: [id number] at [redacted]w[redacted]d 1. Multigravida of advanced maternal age in third trimester Scheduled for follow up growth scan on 05/30/22.  2. Rh negative state in antepartum period Rhogam given today. - rho (d) immune globulin (RHIG/RHOPHYLAC) injection 300 mcg -  Antibody screen  3. Need for Tdap vaccination - Tdap vaccine greater than or equal to 7yo IM given today.  4. [redacted] weeks gestation of pregnancy 5. Supervision of high risk pregnancy, antepartum Third trimester labs done today, will follow up results and manage accordingly. Preterm labor symptoms and general obstetric precautions including but not limited to vaginal bleeding, contractions, leaking of fluid and fetal movement were reviewed in detail with the patient. Please refer to After Visit Summary for other counseling recommendations.   Return in about 2 weeks (around 05/21/2022) for OFFICE OB VISIT (MD or APP).  Future Appointments  Date Time Provider Department Center  05/07/2022  9:55 AM [place] Vanwagoner, Jethro Bastos, MD [facility]  05/21/2022  4:10 PM [place] Bing, MD [facility]  05/30/2022  3:15 PM [facility]URSE [facility]  05/30/2022  3:30 PM [facility] US3 [facility]  06/04/2022  4:10 PM Felice Hope, Jethro Bastos, MD [facility]  06/17/2022  3:30 PM Federico Flake, MD [facility]  07/01/2022  4:10 PM Federico Flake, MD [facility]  07/09/2022  4:10 PM [place] Bing, MD [facility]  07/16/2022  4:10 PM [place] Bing, MD [facility]  07/23/2022  4:10 PM Vane Yapp, Jethro Bastos, MD [facility]    Jaynie Collins, MD

## 2022-05-08 LAB — CBC
Hematocrit: 30.7 % — ABNORMAL LOW (ref 34.0–46.6)
Hemoglobin: 10.5 g/dL — ABNORMAL LOW (ref 11.1–15.9)
MCH: 30.6 pg (ref 26.6–33.0)
MCHC: 34.2 g/dL (ref 31.5–35.7)
MCV: 90 fL (ref 79–97)
Platelets: 232 10*3/uL (ref 150–450)
RBC: 3.43 x10E6/uL — ABNORMAL LOW (ref 3.77–5.28)
RDW: 12.7 % (ref 11.7–15.4)
WBC: 11.2 10*3/uL — ABNORMAL HIGH (ref 3.4–10.8)

## 2022-05-08 LAB — RPR: RPR Ser Ql: NONREACTIVE

## 2022-05-08 LAB — GLUCOSE TOLERANCE, 2 HOURS W/ 1HR
Glucose, 1 hour: 97 mg/dL (ref 70–179)
Glucose, 2 hour: 118 mg/dL (ref 70–152)
Glucose, Fasting: 85 mg/dL (ref 70–91)

## 2022-05-08 LAB — HIV ANTIBODY (ROUTINE TESTING W REFLEX): HIV Screen 4th Generation wRfx: NONREACTIVE

## 2022-05-08 LAB — ANTIBODY SCREEN: Antibody Screen: NEGATIVE

## 2022-05-09 DIAGNOSIS — O99013 Anemia complicating pregnancy, third trimester: Secondary | ICD-10-CM | POA: Insufficient documentation

## 2022-05-09 MED ORDER — FERROUS GLUCONATE 324 (38 FE) MG PO TABS: 324.0000 mg | ORAL_TABLET | Freq: Every day | ORAL | 3 refills | Status: DC

## 2022-05-21 ENCOUNTER — Ambulatory Visit (INDEPENDENT_AMBULATORY_CARE_PROVIDER_SITE_OTHER): Payer: 59 | Admitting: Obstetrics and Gynecology

## 2022-05-21 VITALS — BP 114/78 | HR 94 | Wt 169.0 lb

## 2022-05-21 DIAGNOSIS — O26899 Other specified pregnancy related conditions, unspecified trimester: Secondary | ICD-10-CM

## 2022-05-21 DIAGNOSIS — O3443 Maternal care for other abnormalities of cervix, third trimester: Secondary | ICD-10-CM

## 2022-05-21 DIAGNOSIS — Z87410 Personal history of cervical dysplasia: Secondary | ICD-10-CM

## 2022-05-21 DIAGNOSIS — O09523 Supervision of elderly multigravida, third trimester: Secondary | ICD-10-CM

## 2022-05-21 DIAGNOSIS — Z3A30 30 weeks gestation of pregnancy: Secondary | ICD-10-CM

## 2022-05-21 DIAGNOSIS — Z6791 Unspecified blood type, Rh negative: Secondary | ICD-10-CM

## 2022-05-21 DIAGNOSIS — O99013 Anemia complicating pregnancy, third trimester: Secondary | ICD-10-CM

## 2022-05-21 DIAGNOSIS — Z9889 Other specified postprocedural states: Secondary | ICD-10-CM

## 2022-05-21 NOTE — Progress Notes (Signed)
   PRENATAL VISIT NOTE  Subjective:  Gloria Dean is a 36 y.o. [phone] at [redacted]w[redacted]d being seen today for ongoing prenatal care.  She is currently monitored for the following issues for this high-risk pregnancy and has Rh negative state in antepartum period; History of severe cervical dysplasia (CIN III); Supervision of high risk pregnancy, antepartum; Previous recurrent miscarriages affecting pregnancy, antepartum; AMA (advanced maternal age) multigravida 35+; History of LEEP (loop electrosurgical excision procedure) of cervix complicating pregnancy; and Anemia affecting pregnancy in third trimester on their problem list.  Patient reports no complaints.  Contractions: Not present. Vag. Bleeding: None.  Movement: Present. Denies leaking of fluid.   The following portions of the patient's history were reviewed and updated as appropriate: allergies, current medications, past family history, past medical history, past social history, past surgical history and problem list.   Objective:   Vitals:   05/21/22 1619  BP: 114/78  Pulse: 94  Weight: 169 lb (76.7 kg)    Fetal Status: Fetal Heart Rate (bpm): 136   Movement: Present     General:  Alert, oriented and cooperative. Patient is in no acute distress.  Skin: Skin is warm and dry. No rash noted.   Cardiovascular: Normal heart rate noted  Respiratory: Normal respiratory effort, no problems with respiration noted  Abdomen: Soft, gravid, appropriate for gestational age.  Pain/Pressure: Absent     Pelvic: Cervical exam deferred        Extremities: Normal range of motion.  Edema: Trace  Mental Status: Normal mood and affect. Normal behavior. Normal judgment and thought content.   Assessment and Plan:  Pregnancy: [id number] at [redacted]w[redacted]d 1. History of loop electrosurgical excision procedure (LEEP) of cervix affecting pregnancy in third trimester In 2022  2. History of severe cervical dysplasia (CIN III) Neg 01/2022 pap smear and hpv Repeat pap and  hpv 01/2023  3. Anemia affecting pregnancy in third trimester Repeat cbc 32-34wks  4. Multigravida of advanced maternal age in third trimester Follow mfm surveillance growth u/s 7/6: 58%, ac 70%, afi wnl  5. Rh negative state in antepartum period  6. [redacted] weeks gestation of pregnancy Birth control options d/w her  Preterm labor symptoms and general obstetric precautions including but not limited to vaginal bleeding, contractions, leaking of fluid and fetal movement were reviewed in detail with the patient. Please refer to After Visit Summary for other counseling recommendations.   No follow-ups on file.  Future Appointments  Date Time Provider Department Center  05/30/2022  3:15 PM [facility] NURSE [facility]  05/30/2022  3:30 PM [facility] US3 [facility]  06/04/2022  4:10 PM Anyanwu, Jethro Bastos, MD [facility]  06/17/2022  3:30 PM Federico Flake, MD [facility]  07/01/2022  4:10 PM Federico Flake, MD [facility]  07/09/2022  4:10 PM [place] Bing, MD [facility]  07/16/2022  4:10 PM [place] Bing, MD [facility]  07/23/2022  4:10 PM Anyanwu, Jethro Bastos, MD [facility]    [place] Bing, MD

## 2022-05-30 ENCOUNTER — Ambulatory Visit: Payer: 59 | Attending: Obstetrics and Gynecology

## 2022-05-30 ENCOUNTER — Ambulatory Visit: Payer: 59 | Admitting: *Deleted

## 2022-05-30 VITALS — BP 115/65 | HR 82

## 2022-05-30 DIAGNOSIS — O09522 Supervision of elderly multigravida, second trimester: Secondary | ICD-10-CM | POA: Diagnosis present

## 2022-05-30 DIAGNOSIS — O09523 Supervision of elderly multigravida, third trimester: Secondary | ICD-10-CM

## 2022-05-30 DIAGNOSIS — O2620 Pregnancy care for patient with recurrent pregnancy loss, unspecified trimester: Secondary | ICD-10-CM

## 2022-05-30 DIAGNOSIS — O0990 Supervision of high risk pregnancy, unspecified, unspecified trimester: Secondary | ICD-10-CM | POA: Diagnosis present

## 2022-06-04 ENCOUNTER — Ambulatory Visit (INDEPENDENT_AMBULATORY_CARE_PROVIDER_SITE_OTHER): Payer: 59 | Admitting: Obstetrics & Gynecology

## 2022-06-04 VITALS — BP 110/71 | HR 98 | Wt 171.0 lb

## 2022-06-04 DIAGNOSIS — O0990 Supervision of high risk pregnancy, unspecified, unspecified trimester: Secondary | ICD-10-CM

## 2022-06-04 DIAGNOSIS — O0993 Supervision of high risk pregnancy, unspecified, third trimester: Secondary | ICD-10-CM

## 2022-06-04 DIAGNOSIS — O99013 Anemia complicating pregnancy, third trimester: Secondary | ICD-10-CM

## 2022-06-04 DIAGNOSIS — R42 Dizziness and giddiness: Secondary | ICD-10-CM

## 2022-06-04 DIAGNOSIS — Z3A32 32 weeks gestation of pregnancy: Secondary | ICD-10-CM

## 2022-06-04 NOTE — Progress Notes (Signed)
PRENATAL VISIT NOTE  Subjective:  Gloria Dean is a 36 y.o. [phone] at [redacted]w[redacted]d being seen today for ongoing prenatal care.  She is currently monitored for the following issues for this high-risk pregnancy and has Rh negative state in antepartum period; History of severe cervical dysplasia (CIN III); Supervision of high risk pregnancy, antepartum; Previous recurrent miscarriages affecting pregnancy, antepartum; AMA (advanced maternal age) multigravida 35+; History of LEEP (loop electrosurgical excision procedure) of cervix complicating pregnancy; and Anemia affecting pregnancy in third trimester on their problem list.  Patient reports  dizzy spells for one week.  No palpitations, no syncope .  Contractions: Not present. Vag. Bleeding: None.  Movement: Present. Denies leaking of fluid.   The following portions of the patient's history were reviewed and updated as appropriate: allergies, current medications, past family history, past medical history, past social history, past surgical history and problem list.   Objective:   Vitals:   06/04/22 1619  BP: 110/71  Pulse: 98  Weight: 171 lb (77.6 kg)    Fetal Status: Fetal Heart Rate (bpm): 134 Fundal Height: 33 cm Movement: Present     General:  Alert, oriented and cooperative. Patient is in no acute distress.  Skin: Skin is warm and dry. No rash noted.   Cardiovascular: Normal heart rate noted  Respiratory: Normal respiratory effort, no problems with respiration noted  Abdomen: Soft, gravid, appropriate for gestational age.  Pain/Pressure: Present     Pelvic: Cervical exam deferred        Extremities: Normal range of motion.  Edema: Trace  Mental Status: Normal mood and affect. Normal behavior. Normal judgment and thought content.   [place] [facility] OB FOLLOW UP  Result Date: 05/30/2022 ----------------------------------------------------------------------  OBSTETRICS REPORT                       (Signed Final 05/30/2022 04:19 pm)  ---------------------------------------------------------------------- Patient Info  ID #:       [id number]                          D.O.B.:  04-Oct-1986 (35 yrs)  Name:       Gloria Dean                Visit Date: 05/30/2022 03:40 pm ---------------------------------------------------------------------- Performed By  Attending:        MFM Provider           Ref. Address:     [place]  Performed By:     Nathen May       [facility]  Referred By:      [facility] ---------------------------------------------------------------------- Orders  #  Description                           Code        Ordered By  1  [place] MFM OB FOLLOW UP                   [phone]    Rosana Hoes ----------------------------------------------------------------------  #  Order #                     Accession #                Episode #  1  [id number]                   [id number]                 [id number] ---------------------------------------------------------------------- Indications  Advanced maternal age multigravida 40+,        O62.523  third trimester (35 yrs)  Previous cervical surgery (LEEP)               O34.40  Poor obstetric history: Previous gestational   O36.299  HTN  Rh negative state in antepartum                O36.0190  [redacted] weeks gestation of pregnancy                Z3A.32  Low risk NIPS/neg Horizon 4 ---------------------------------------------------------------------- Vital Signs  BP:          115/65 ---------------------------------------------------------------------- Fetal Evaluation  Num Of Fetuses:         1  Fetal Heart Rate(bpm):  120  Cardiac Activity:       Observed  Presentation:           Cephalic   Placenta:               Posterior  P. Cord Insertion:      Previously Visualized  Amniotic Fluid  AFI FV:      Within normal limits  AFI Sum(cm)     %Tile       Largest Pocket(cm)  20.2            77          5.44  RUQ(cm)       RLQ(cm)       LUQ(cm)        LLQ(cm)  5.43          5.44          4.95           4.38 ---------------------------------------------------------------------- Biometry  BPD:     80.31  mm     G. Age:  32w 2d         49  %    CI:        75.27   %    70 - 86  FL/HC:      20.1   %    19.1 - 21.3  HC:    293.61   mm     G. Age:  32w 3d         23  %    HC/AC:      1.01        0.96 - 1.17  AC:    292.09   mm     G. Age:  33w 1d         82  %    FL/BPD:     73.7   %    71 - 87  FL:      59.16  mm     G. Age:  30w 6d         12  %    FL/AC:      20.3   %    20 - 24  Est. FW:    1956  gm      4 lb 5 oz     51  % ---------------------------------------------------------------------- OB History  Gravidity:    7         Term:   3        Prem:   0        SAB:   3  TOP:          0       Ectopic:  0        Living: 3 ---------------------------------------------------------------------- Gestational Age  LMP:           32w 0d        Date:  10/18/21                  EDD:   07/25/22  U/S Today:     32w 1d                                        EDD:   07/24/22  Best:          Milderd Meager 0d     Det. By:  LMP  (10/18/21)          EDD:   07/25/22 ---------------------------------------------------------------------- Anatomy  Cranium:               Appears normal         LVOT:                   Previously seen  Cavum:                 Appears normal         Aortic Arch:            Previously seen  Ventricles:            Previously seen        Ductal Arch:            Previously seen  Choroid Plexus:        Previously seen        Diaphragm:              Appears normal  Cerebellum:            Previously seen        Stomach:                Appears normal, left  sided  Posterior Fossa:       Previously seen        Abdomen:                Appears normal  Nuchal Fold:           Previously seen        Abdominal Wall:         Previously seen  Face:                  Orbits and profile     Cord Vessels:           Previously seen                         previously seen  Lips:                  Previously seen        Kidneys:                Appear normal  Palate:                Previously seen        Bladder:                Appears normal  Thoracic:              Appears normal         Spine:                  Previously seen  Heart:                 Appears normal         Upper Extremities:      Previously seen                         (4CH, axis, and                         situs)  RVOT:                  Previously seen        Lower Extremities:      Previously seen  Other:  Female gender previously seen. Heels/feet, open hands, nasal bone,          lenses, maxilla, mandible, 3VV, 3VTV and VC previously visualized. ---------------------------------------------------------------------- Cervix Uterus Adnexa  Cervix  Not visualized (advanced GA >24wks) ---------------------------------------------------------------------- Comments  Follow-up growth ultrasound at 32w 0d with EDD of  07/25/2022 dated by LMP  (10/18/21). Pregnancy  complicated by AMA ([id number]) Aneuploidy screening: low risk  NIPS.  Sonographic findings  Single intrauterine pregnancy at at  [place] fetal cardiac activity.  Cephalic presentation.  Interval fetal anatomy appears normal.  Fetal biometry shows the estimated fetal weight at the 51  percentile.  Amniotic fluid volume: Within normal limits. AFI: 20.2 cm.  MVP: 5.44 cm.  Placenta is Posterior.  Recommendations  1. Follow-up US only as clinically indicated ----------------------------------------------------------------------                  Valeda Malm, DO Electronically Signed Final Report   05/30/2022  04:19 pm ----------------------------------------------------------------------   Assessment and Plan:  Pregnancy: [id number] at [redacted]w[redacted]d 1. Dizzy spells Will check labs. Patient reassured, told to call for worsening symptoms.  - Anemia Profile B -  Comprehensive metabolic panel  2. Anemia affecting pregnancy in third trimester On oral iron therapy, will follow up results and manage accordingly. - Anemia Profile B  3. [redacted] weeks gestation of pregnancy 4. Supervision of high risk pregnancy, antepartum No other concerns. Preterm labor symptoms and general obstetric precautions including but not limited to vaginal bleeding, contractions, leaking of fluid and fetal movement were reviewed in detail with the patient. Please refer to After Visit Summary for other counseling recommendations.   No follow-ups on file.  Future Appointments  Date Time Provider Department Center  06/17/2022  3:30 PM Federico Flake, MD [facility]  07/01/2022  4:10 PM Federico Flake, MD [facility]  07/09/2022  4:10 PM [place] Bing, MD [facility]  07/16/2022  4:10 PM [place] Bing, MD [facility]  07/23/2022  4:10 PM Paul Torpey, Jethro Bastos, MD [facility]    Jaynie Collins, MD

## 2022-06-05 LAB — ANEMIA PROFILE B
Basophils Absolute: 0.1 10*3/uL (ref 0.0–0.2)
Basos: 0 %
EOS (ABSOLUTE): 0.2 10*3/uL (ref 0.0–0.4)
Eos: 1 %
Ferritin: 28 ng/mL (ref 15–150)
Folate: >20 ng/mL (ref >3.0)
Hematocrit: 32.9 % — ABNORMAL LOW (ref 34.0–46.6)
Hemoglobin: 11.4 g/dL (ref 11.1–15.9)
Immature Grans (Abs): 0.1 10*3/uL (ref 0.0–0.1)
Immature Granulocytes: 1 %
Iron Saturation: 15 % (ref 15–55)
Iron: 66 ug/dL (ref 27–159)
Lymphocytes Absolute: 1.8 10*3/uL (ref 0.7–3.1)
Lymphs: 15 %
MCH: 31 pg (ref 26.6–33.0)
MCHC: 34.7 g/dL (ref 31.5–35.7)
MCV: 89 fL (ref 79–97)
Monocytes Absolute: 0.7 10*3/uL (ref 0.1–0.9)
Monocytes: 6 %
Neutrophils Absolute: 9.4 10*3/uL — ABNORMAL HIGH (ref 1.4–7.0)
Neutrophils: 77 %
Platelets: 225 10*3/uL (ref 150–450)
RBC: 3.68 x10E6/uL — ABNORMAL LOW (ref 3.77–5.28)
RDW: 12.9 % (ref 11.7–15.4)
Retic Ct Pct: 2.5 % (ref 0.6–2.6)
Total Iron Binding Capacity: 453 ug/dL — ABNORMAL HIGH (ref 250–450)
UIBC: 387 ug/dL (ref 131–425)
Vitamin B-12: 330 pg/mL (ref 232–1245)
WBC: 12.3 10*3/uL — ABNORMAL HIGH (ref 3.4–10.8)

## 2022-06-05 LAB — COMPREHENSIVE METABOLIC PANEL
ALT: 9 IU/L (ref 0–32)
AST: 12 IU/L (ref 0–40)
Albumin/Globulin Ratio: 1.7 (ref 1.2–2.2)
Albumin: 3.6 g/dL — ABNORMAL LOW (ref 3.9–4.9)
Alkaline Phosphatase: 92 IU/L (ref 44–121)
BUN/Creatinine Ratio: 5 — ABNORMAL LOW (ref 9–23)
BUN: 4 mg/dL — ABNORMAL LOW (ref 6–20)
Bilirubin Total: <0.2 mg/dL (ref 0.0–1.2)
CO2: 20 mmol/L (ref 20–29)
Calcium: 9 mg/dL (ref 8.7–10.2)
Chloride: 103 mmol/L (ref 96–106)
Creatinine, Ser: 0.77 mg/dL (ref 0.57–1.00)
Globulin, Total: 2.1 g/dL (ref 1.5–4.5)
Glucose: 101 mg/dL — ABNORMAL HIGH (ref 70–99)
Potassium: 3.8 mmol/L (ref 3.5–5.2)
Sodium: 138 mmol/L (ref 134–144)
Total Protein: 5.7 g/dL — ABNORMAL LOW (ref 6.0–8.5)
eGFR: 102 mL/min/{1.73_m2} (ref >59)

## 2022-06-17 ENCOUNTER — Ambulatory Visit (INDEPENDENT_AMBULATORY_CARE_PROVIDER_SITE_OTHER): Payer: 59 | Admitting: Family Medicine

## 2022-06-17 VITALS — BP 120/80 | HR 92 | Wt 173.0 lb

## 2022-06-17 DIAGNOSIS — Z9889 Other specified postprocedural states: Secondary | ICD-10-CM

## 2022-06-17 DIAGNOSIS — O3443 Maternal care for other abnormalities of cervix, third trimester: Secondary | ICD-10-CM

## 2022-06-17 DIAGNOSIS — O0990 Supervision of high risk pregnancy, unspecified, unspecified trimester: Secondary | ICD-10-CM

## 2022-06-17 DIAGNOSIS — O09299 Supervision of pregnancy with other poor reproductive or obstetric history, unspecified trimester: Secondary | ICD-10-CM | POA: Insufficient documentation

## 2022-06-17 DIAGNOSIS — O99013 Anemia complicating pregnancy, third trimester: Secondary | ICD-10-CM

## 2022-06-17 DIAGNOSIS — O0993 Supervision of high risk pregnancy, unspecified, third trimester: Secondary | ICD-10-CM

## 2022-06-17 DIAGNOSIS — O09523 Supervision of elderly multigravida, third trimester: Secondary | ICD-10-CM

## 2022-06-17 DIAGNOSIS — Z3A34 34 weeks gestation of pregnancy: Secondary | ICD-10-CM

## 2022-06-17 DIAGNOSIS — O09293 Supervision of pregnancy with other poor reproductive or obstetric history, third trimester: Secondary | ICD-10-CM

## 2022-06-17 NOTE — Progress Notes (Signed)
Still having dizzy spells and leg cramps

## 2022-06-17 NOTE — Progress Notes (Signed)
   PRENATAL VISIT NOTE  Subjective:  Gloria Dean is a 36 y.o. [phone] at [redacted]w[redacted]d being seen today for ongoing prenatal care.  She is currently monitored for the following issues for this high-risk pregnancy and has Rh negative state in antepartum period; History of severe cervical dysplasia (CIN III); Supervision of high risk pregnancy, antepartum; Previous recurrent miscarriages affecting pregnancy, antepartum; AMA (advanced maternal age) multigravida 35+; History of LEEP (loop electrosurgical excision procedure) of cervix complicating pregnancy; Anemia affecting pregnancy in third trimester; and History of pre-eclampsia in prior pregnancy, currently pregnant on their problem list.  Patient reports  episodes of dizziness, similar to when she got PEC in the past. .  Contractions: Not present. Vag. Bleeding: None.  Movement: Present. Denies leaking of fluid.   The following portions of the patient's history were reviewed and updated as appropriate: allergies, current medications, past family history, past medical history, past social history, past surgical history and problem list.   Objective:   Vitals:   06/17/22 1608  BP: 120/80  Pulse: 92  Weight: 173 lb (78.5 kg)    Fetal Status: Fetal Heart Rate (bpm): 154   Movement: Present     General:  Alert, oriented and cooperative. Patient is in no acute distress.  Skin: Skin is warm and dry. No rash noted.   Cardiovascular: Normal heart rate noted  Respiratory: Normal respiratory effort, no problems with respiration noted  Abdomen: Soft, gravid, appropriate for gestational age.  Pain/Pressure: Present     Pelvic: Cervical exam deferred        Extremities: Normal range of motion.  Edema: Trace  Mental Status: Normal mood and affect. Normal behavior. Normal judgment and thought content.   Assessment and Plan:  Pregnancy: [id number] at [redacted]w[redacted]d 1. History of pre-eclampsia in prior pregnancy, currently pregnant BP 120/80 today Monitor  closely  2. Supervision of high risk pregnancy, antepartum Up to date Having dizziness spells x 3 weeks. BP today is WNL. Encouraged to check at home and contact us with > 140/90 FH appropriate FHR WNL  3. History of loop electrosurgical excision procedure (LEEP) of cervix affecting pregnancy in third trimester   4. Multigravida of advanced maternal age in third trimester LR NIPS  5. Anemia affecting pregnancy in third trimester 11.4 after supplementation   Preterm labor symptoms and general obstetric precautions including but not limited to vaginal bleeding, contractions, leaking of fluid and fetal movement were reviewed in detail with the patient. Please refer to After Visit Summary for other counseling recommendations.   Return in about 2 weeks (around 07/01/2022) for Routine prenatal care.  Future Appointments  Date Time Provider Department Center  07/01/2022  4:10 PM Federico Flake, MD [facility]  07/09/2022  4:10 PM [place] Bing, MD [facility]  07/16/2022  4:10 PM [place] Bing, MD [facility]  07/23/2022  4:10 PM Anyanwu, Jethro Bastos, MD [facility]    Federico Flake, MD

## 2022-07-01 ENCOUNTER — Other Ambulatory Visit (HOSPITAL_COMMUNITY)
Admission: RE | Admit: 2022-07-01 | Discharge: 2022-07-01 | Disposition: A | Discharge status: Home / Self Care | Payer: 59 | Source: Ambulatory Visit | Attending: Family Medicine | Admitting: Family Medicine

## 2022-07-01 ENCOUNTER — Ambulatory Visit (INDEPENDENT_AMBULATORY_CARE_PROVIDER_SITE_OTHER): Payer: 59 | Admitting: Family Medicine

## 2022-07-01 VITALS — BP 128/81 | HR 106 | Wt 176.0 lb

## 2022-07-01 DIAGNOSIS — O2620 Pregnancy care for patient with recurrent pregnancy loss, unspecified trimester: Secondary | ICD-10-CM

## 2022-07-01 DIAGNOSIS — O0990 Supervision of high risk pregnancy, unspecified, unspecified trimester: Secondary | ICD-10-CM

## 2022-07-01 DIAGNOSIS — O3443 Maternal care for other abnormalities of cervix, third trimester: Secondary | ICD-10-CM

## 2022-07-01 DIAGNOSIS — Z9889 Other specified postprocedural states: Secondary | ICD-10-CM

## 2022-07-01 DIAGNOSIS — O26899 Other specified pregnancy related conditions, unspecified trimester: Secondary | ICD-10-CM

## 2022-07-01 DIAGNOSIS — Z6791 Unspecified blood type, Rh negative: Secondary | ICD-10-CM

## 2022-07-01 DIAGNOSIS — Z3A36 36 weeks gestation of pregnancy: Secondary | ICD-10-CM

## 2022-07-01 NOTE — Progress Notes (Signed)
   PRENATAL VISIT NOTE  Subjective:  Gloria Dean is a 36 y.o. [phone] at [redacted]w[redacted]d being seen today for ongoing prenatal care.  She is currently monitored for the following issues for this high-risk pregnancy and has Rh negative state in antepartum period; History of severe cervical dysplasia (CIN III); Supervision of high risk pregnancy, antepartum; Previous recurrent miscarriages affecting pregnancy, antepartum; AMA (advanced maternal age) multigravida 35+; History of LEEP (loop electrosurgical excision procedure) of cervix complicating pregnancy; Anemia affecting pregnancy in third trimester; and History of pre-eclampsia in prior pregnancy, currently pregnant on their problem list.  Patient reports no complaints.  Contractions: Irritability. Vag. Bleeding: None.  Movement: Present. Denies leaking of fluid.   The following portions of the patient's history were reviewed and updated as appropriate: allergies, current medications, past family history, past medical history, past social history, past surgical history and problem list.   Objective:   Vitals:   07/01/22 1605  BP: 128/81  Pulse: (!) 106  Weight: 176 lb (79.8 kg)    Fetal Status: Fetal Heart Rate (bpm): 133   Movement: Present     General:  Alert, oriented and cooperative. Patient is in no acute distress.  Skin: Skin is warm and dry. No rash noted.   Cardiovascular: Normal heart rate noted  Respiratory: Normal respiratory effort, no problems with respiration noted  Abdomen: Soft, gravid, appropriate for gestational age.  Pain/Pressure: Present     Pelvic: Cervical exam deferred        Extremities: Normal range of motion.  Edema: Mild pitting, slight indentation  Mental Status: Normal mood and affect. Normal behavior. Normal judgment and thought content.   Assessment and Plan:  Pregnancy: [id number] at [redacted]w[redacted]d 1. History of loop electrosurgical excision procedure (LEEP) of cervix affecting pregnancy in third trimester Repeat  paps per guideliens  2. Supervision of high risk pregnancy, antepartum UTD Discussed testing today - Cervicovaginal ancillary only - Strep Gp B NAA  3. Rh negative state in antepartum period Rhogam received at 28 wk Work up pp  4. Previous recurrent miscarriages affecting pregnancy, antepartum   Preterm labor symptoms and general obstetric precautions including but not limited to vaginal bleeding, contractions, leaking of fluid and fetal movement were reviewed in detail with the patient. Please refer to After Visit Summary for other counseling recommendations.   Return in about 1 week (around 07/08/2022) for Routine prenatal care.  Future Appointments  Date Time Provider [place]  07/09/2022  4:10 PM Aletha Halim, MD [facility]  07/16/2022  4:10 PM Aletha Halim, MD [facility]  07/23/2022  4:10 PM Anyanwu, Sallyanne Havers, MD [facility]    Caren Macadam, MD

## 2022-07-03 LAB — CERVICOVAGINAL ANCILLARY ONLY
Chlamydia: NEGATIVE
Comment: NEGATIVE
Comment: NORMAL
Neisseria Gonorrhea: NEGATIVE

## 2022-07-03 LAB — STREP GP B NAA: Strep Gp B NAA: NEGATIVE

## 2022-07-09 ENCOUNTER — Ambulatory Visit (INDEPENDENT_AMBULATORY_CARE_PROVIDER_SITE_OTHER): Payer: 59 | Admitting: Obstetrics and Gynecology

## 2022-07-09 VITALS — BP 115/79 | HR 80 | Wt 176.0 lb

## 2022-07-09 DIAGNOSIS — O09293 Supervision of pregnancy with other poor reproductive or obstetric history, third trimester: Secondary | ICD-10-CM

## 2022-07-09 DIAGNOSIS — O99013 Anemia complicating pregnancy, third trimester: Secondary | ICD-10-CM

## 2022-07-09 DIAGNOSIS — Z3490 Encounter for supervision of normal pregnancy, unspecified, unspecified trimester: Secondary | ICD-10-CM

## 2022-07-09 DIAGNOSIS — Z9889 Other specified postprocedural states: Secondary | ICD-10-CM

## 2022-07-09 DIAGNOSIS — Z6791 Unspecified blood type, Rh negative: Secondary | ICD-10-CM

## 2022-07-09 DIAGNOSIS — O3443 Maternal care for other abnormalities of cervix, third trimester: Secondary | ICD-10-CM

## 2022-07-09 DIAGNOSIS — O09299 Supervision of pregnancy with other poor reproductive or obstetric history, unspecified trimester: Secondary | ICD-10-CM

## 2022-07-09 DIAGNOSIS — O26893 Other specified pregnancy related conditions, third trimester: Secondary | ICD-10-CM

## 2022-07-09 DIAGNOSIS — O26899 Other specified pregnancy related conditions, unspecified trimester: Secondary | ICD-10-CM

## 2022-07-09 DIAGNOSIS — Z3493 Encounter for supervision of normal pregnancy, unspecified, third trimester: Secondary | ICD-10-CM

## 2022-07-09 DIAGNOSIS — Z3A39 39 weeks gestation of pregnancy: Secondary | ICD-10-CM

## 2022-07-09 DIAGNOSIS — O09523 Supervision of elderly multigravida, third trimester: Secondary | ICD-10-CM

## 2022-07-09 NOTE — Progress Notes (Unsigned)
   PRENATAL VISIT NOTE  Subjective:  Gloria Dean is a 36 y.o. [phone] at [redacted]w[redacted]d being seen today for ongoing prenatal care.  She is currently monitored for the following issues for this low-risk pregnancy and has Rh negative state in antepartum period; History of severe cervical dysplasia (CIN III); Supervision of high risk pregnancy, antepartum; Previous recurrent miscarriages affecting pregnancy, antepartum; AMA (advanced maternal age) multigravida 35+; History of LEEP (loop electrosurgical excision procedure) of cervix complicating pregnancy; Anemia affecting pregnancy in third trimester; and History of pre-eclampsia in prior pregnancy, currently pregnant on their problem list.  Patient reports no complaints.  Contractions: Irritability. Vag. Bleeding: None.  Movement: Present. Denies leaking of fluid.   The following portions of the patient's history were reviewed and updated as appropriate: allergies, current medications, past family history, past medical history, past social history, past surgical history and problem list.   Objective:   Vitals:   07/09/22 1606  BP: 115/79  Pulse: 80  Weight: 176 lb (79.8 kg)    Fetal Status: Fetal Heart Rate (bpm): 140s Fundal Height: 37 cm Movement: Present  Presentation: Vertex  General:  Alert, oriented and cooperative. Patient is in no acute distress.  Skin: Skin is warm and dry. No rash noted.   Cardiovascular: Normal heart rate noted  Respiratory: Normal respiratory effort, no problems with respiration noted  Abdomen: Soft, gravid, appropriate for gestational age.  Pain/Pressure: Present     Pelvic: Cervical exam performed in the presence of a chaperone Dilation: 1 Effacement (%): 20 Station: -2  Extremities: Normal range of motion.  Edema: Mild pitting, slight indentation  Mental Status: Normal mood and affect. Normal behavior. Normal judgment and thought content.   Assessment and Plan:  Pregnancy: [id number] at [redacted]w[redacted]d 1. Term  pregnancy Desires elective IOL. Set up for 39wks - CBC; Standing - Type and screen; Standing - RPR; Standing  2. [redacted] weeks gestation of pregnancy  3. History of pre-eclampsia in prior pregnancy, currently pregnant  4. Anemia affecting pregnancy in third trimester resolved   5. History of loop electrosurgical excision procedure (LEEP) of cervix affecting pregnancy in third trimester  6. Multigravida of advanced maternal age in third trimester  7. Rh negative state in antepartum period  Term labor symptoms and general obstetric precautions including but not limited to vaginal bleeding, contractions, leaking of fluid and fetal movement were reviewed in detail with the patient. Please refer to After Visit Summary for other counseling recommendations.   Return in about 1 week (around 07/16/2022) for in person, md or app, low risk ob.  Future Appointments  Date Time Provider Department Center  07/16/2022  4:10 PM [place] Bing, MD [facility]    [place] Bing, MD

## 2022-07-16 ENCOUNTER — Ambulatory Visit (INDEPENDENT_AMBULATORY_CARE_PROVIDER_SITE_OTHER): Payer: 59 | Admitting: Obstetrics and Gynecology

## 2022-07-16 VITALS — BP 115/77 | HR 82 | Wt 175.0 lb

## 2022-07-16 DIAGNOSIS — Z3A38 38 weeks gestation of pregnancy: Secondary | ICD-10-CM

## 2022-07-16 DIAGNOSIS — Z3493 Encounter for supervision of normal pregnancy, unspecified, third trimester: Secondary | ICD-10-CM

## 2022-07-17 ENCOUNTER — Other Ambulatory Visit: Payer: Self-pay | Admitting: Advanced Practice Midwife

## 2022-07-17 ENCOUNTER — Telehealth (HOSPITAL_COMMUNITY): Payer: Self-pay | Admitting: *Deleted

## 2022-07-17 ENCOUNTER — Encounter (HOSPITAL_COMMUNITY): Payer: Self-pay | Admitting: *Deleted

## 2022-07-17 NOTE — Telephone Encounter (Signed)
Preadmission screen  

## 2022-07-17 NOTE — Progress Notes (Signed)
   PRENATAL VISIT NOTE  Subjective:  Gloria Dean is a 36 y.o. [phone] at [redacted]w[redacted]d being seen today for ongoing prenatal care.  She is currently monitored for the following issues for this low-risk pregnancy and has Rh negative state in antepartum period; History of severe cervical dysplasia (CIN III); Supervision of high risk pregnancy, antepartum; Previous recurrent miscarriages affecting pregnancy, antepartum; AMA (advanced maternal age) multigravida 35+; History of LEEP (loop electrosurgical excision procedure) of cervix complicating pregnancy; Anemia affecting pregnancy in third trimester; and History of pre-eclampsia in prior pregnancy, currently pregnant on their problem list.  Patient reports no complaints.  Contractions: Irritability. Vag. Bleeding: None.  Movement: Present. Denies leaking of fluid.   The following portions of the patient's history were reviewed and updated as appropriate: allergies, current medications, past family history, past medical history, past social history, past surgical history and problem list.   Objective:   Vitals:   07/16/22 1615  BP: 115/77  Pulse: 82  Weight: 175 lb (79.4 kg)    Fetal Status: Fetal Heart Rate (bpm): 138 Fundal Height: 39 cm Movement: Present  Presentation: Vertex  General:  Alert, oriented and cooperative. Patient is in no acute distress.  Skin: Skin is warm and dry. No rash noted.   Cardiovascular: Normal heart rate noted  Respiratory: Normal respiratory effort, no problems with respiration noted  Abdomen: Soft, gravid, appropriate for gestational age.  Pain/Pressure: Present     Pelvic: Cervical exam performed in the presence of a chaperone Dilation: 1 Effacement (%): 70 Station: -1  Extremities: Normal range of motion.  Edema: Mild pitting, slight indentation  Mental Status: Normal mood and affect. Normal behavior. Normal judgment and thought content.   Assessment and Plan:  Pregnancy: [id number] at [redacted]w[redacted]d Pregnancy Patient  already set up for 10/15 [place] elective IOL. GBS neg  Term labor symptoms and general obstetric precautions including but not limited to vaginal bleeding, contractions, leaking of fluid and fetal movement were reviewed in detail with the patient. Please refer to After Visit Summary for other counseling recommendations.   No follow-ups on file.  Future Appointments  Date Time Provider [place]  07/21/2022 12:00 AM [facility] SCHED ROOM [facility] None    Aletha Halim, MD

## 2022-07-21 ENCOUNTER — Inpatient Hospital Stay (HOSPITAL_COMMUNITY): Payer: 59

## 2022-07-22 ENCOUNTER — Ambulatory Visit (INDEPENDENT_AMBULATORY_CARE_PROVIDER_SITE_OTHER): Payer: 59 | Admitting: *Deleted

## 2022-07-22 ENCOUNTER — Encounter (HOSPITAL_COMMUNITY): Payer: Self-pay | Admitting: Obstetrics and Gynecology

## 2022-07-22 ENCOUNTER — Inpatient Hospital Stay (HOSPITAL_COMMUNITY)
Admission: AD | Admit: 2022-07-22 | Discharge: 2022-07-24 | DRG: 807 | Disposition: A | Discharge status: Home / Self Care | Payer: 59 | Attending: Family Medicine | Admitting: Family Medicine

## 2022-07-22 ENCOUNTER — Ambulatory Visit (INDEPENDENT_AMBULATORY_CARE_PROVIDER_SITE_OTHER): Payer: 59 | Admitting: Family Medicine

## 2022-07-22 ENCOUNTER — Other Ambulatory Visit: Payer: Self-pay

## 2022-07-22 VITALS — BP 143/89 | HR 94 | Wt 177.0 lb

## 2022-07-22 VITALS — BP 138/86 | HR 86

## 2022-07-22 DIAGNOSIS — Z86001 Personal history of in-situ neoplasm of cervix uteri: Secondary | ICD-10-CM

## 2022-07-22 DIAGNOSIS — O26893 Other specified pregnancy related conditions, third trimester: Secondary | ICD-10-CM

## 2022-07-22 DIAGNOSIS — O26899 Other specified pregnancy related conditions, unspecified trimester: Secondary | ICD-10-CM

## 2022-07-22 DIAGNOSIS — O134 Gestational [pregnancy-induced] hypertension without significant proteinuria, complicating childbirth: Principal | ICD-10-CM | POA: Diagnosis present

## 2022-07-22 DIAGNOSIS — O480 Post-term pregnancy: Secondary | ICD-10-CM | POA: Diagnosis present

## 2022-07-22 DIAGNOSIS — O09293 Supervision of pregnancy with other poor reproductive or obstetric history, third trimester: Secondary | ICD-10-CM

## 2022-07-22 DIAGNOSIS — O9902 Anemia complicating childbirth: Secondary | ICD-10-CM | POA: Diagnosis present

## 2022-07-22 DIAGNOSIS — O09523 Supervision of elderly multigravida, third trimester: Secondary | ICD-10-CM

## 2022-07-22 DIAGNOSIS — O09299 Supervision of pregnancy with other poor reproductive or obstetric history, unspecified trimester: Secondary | ICD-10-CM

## 2022-07-22 DIAGNOSIS — O0990 Supervision of high risk pregnancy, unspecified, unspecified trimester: Secondary | ICD-10-CM

## 2022-07-22 DIAGNOSIS — Z8616 Personal history of COVID-19: Secondary | ICD-10-CM

## 2022-07-22 DIAGNOSIS — Z6791 Unspecified blood type, Rh negative: Secondary | ICD-10-CM

## 2022-07-22 DIAGNOSIS — Z3A39 39 weeks gestation of pregnancy: Secondary | ICD-10-CM

## 2022-07-22 DIAGNOSIS — O3443 Maternal care for other abnormalities of cervix, third trimester: Secondary | ICD-10-CM

## 2022-07-22 DIAGNOSIS — Z23 Encounter for immunization: Secondary | ICD-10-CM

## 2022-07-22 DIAGNOSIS — O0993 Supervision of high risk pregnancy, unspecified, third trimester: Secondary | ICD-10-CM

## 2022-07-22 DIAGNOSIS — Z87891 Personal history of nicotine dependence: Secondary | ICD-10-CM | POA: Diagnosis not present

## 2022-07-22 DIAGNOSIS — Z9889 Other specified postprocedural states: Secondary | ICD-10-CM

## 2022-07-22 DIAGNOSIS — O99013 Anemia complicating pregnancy, third trimester: Secondary | ICD-10-CM | POA: Diagnosis present

## 2022-07-22 DIAGNOSIS — O2620 Pregnancy care for patient with recurrent pregnancy loss, unspecified trimester: Secondary | ICD-10-CM

## 2022-07-22 DIAGNOSIS — O368131 Decreased fetal movements, third trimester, fetus 1: Secondary | ICD-10-CM | POA: Diagnosis not present

## 2022-07-22 DIAGNOSIS — O09529 Supervision of elderly multigravida, unspecified trimester: Secondary | ICD-10-CM

## 2022-07-22 DIAGNOSIS — Z3490 Encounter for supervision of normal pregnancy, unspecified, unspecified trimester: Secondary | ICD-10-CM

## 2022-07-22 LAB — CBC
HCT: 33 % — ABNORMAL LOW (ref 36.0–46.0)
Hemoglobin: 12 g/dL (ref 12.0–15.0)
MCH: 31.3 pg (ref 26.0–34.0)
MCHC: 36.4 g/dL — ABNORMAL HIGH (ref 30.0–36.0)
MCV: 86.2 fL (ref 80.0–100.0)
Platelets: 198 10*3/uL (ref 150–400)
RBC: 3.83 MIL/uL — ABNORMAL LOW (ref 3.87–5.11)
RDW: 13.2 % (ref 11.5–15.5)
WBC: 16 10*3/uL — ABNORMAL HIGH (ref 4.0–10.5)
nRBC: 0 % (ref 0.0–0.2)

## 2022-07-22 LAB — COMPREHENSIVE METABOLIC PANEL
ALT: 12 U/L (ref 0–44)
AST: 22 U/L (ref 15–41)
Albumin: 2.7 g/dL — ABNORMAL LOW (ref 3.5–5.0)
Alkaline Phosphatase: 128 U/L — ABNORMAL HIGH (ref 38–126)
Anion gap: 10 (ref 5–15)
BUN: 5 mg/dL — ABNORMAL LOW (ref 6–20)
CO2: 21 mmol/L — ABNORMAL LOW (ref 22–32)
Calcium: 8.8 mg/dL — ABNORMAL LOW (ref 8.9–10.3)
Chloride: 104 mmol/L (ref 98–111)
Creatinine, Ser: 0.51 mg/dL (ref 0.44–1.00)
GFR, Estimated: >60 mL/min (ref >60)
Glucose, Bld: 98 mg/dL (ref 70–99)
Potassium: 3.4 mmol/L — ABNORMAL LOW (ref 3.5–5.1)
Sodium: 135 mmol/L (ref 135–145)
Total Bilirubin: 0.2 mg/dL — ABNORMAL LOW (ref 0.3–1.2)
Total Protein: 5.8 g/dL — ABNORMAL LOW (ref 6.5–8.1)

## 2022-07-22 LAB — TYPE AND SCREEN
ABO/RH(D): AB NEG
Antibody Screen: POSITIVE

## 2022-07-22 MED ORDER — OXYTOCIN BOLUS FROM INFUSION
333.0000 mL | Freq: Once | INTRAVENOUS | Status: AC
  Administered 2022-07-23: 333 mL via INTRAVENOUS

## 2022-07-22 MED ORDER — MISOPROSTOL 25 MCG QUARTER TABLET
25.0000 ug | ORAL_TABLET | Freq: Once | ORAL | Status: AC
  Administered 2022-07-22: 25 ug via ORAL
  Filled 2022-07-22: qty 1

## 2022-07-22 MED ORDER — PRENATAL MULTIVITAMIN CH: 1.0000 | ORAL_TABLET | Freq: Every day | ORAL | Status: DC

## 2022-07-22 MED ORDER — FENTANYL CITRATE (PF) 100 MCG/2ML IJ SOLN: 50.0000 ug | INTRAMUSCULAR | Status: DC | PRN

## 2022-07-22 MED ORDER — SOD CITRATE-CITRIC ACID 500-334 MG/5ML PO SOLN: 30.0000 mL | ORAL | Status: DC | PRN

## 2022-07-22 MED ORDER — MISOPROSTOL 25 MCG QUARTER TABLET
25.0000 ug | ORAL_TABLET | Freq: Once | ORAL | Status: AC
  Administered 2022-07-22: 25 ug via VAGINAL
  Filled 2022-07-22: qty 1

## 2022-07-22 MED ORDER — TERBUTALINE SULFATE 1 MG/ML IJ SOLN: 0.2500 mg | Freq: Once | INTRAMUSCULAR | Status: DC | PRN

## 2022-07-22 MED ORDER — FERROUS GLUCONATE 324 (38 FE) MG PO TABS
324.0000 mg | ORAL_TABLET | Freq: Every day | ORAL | Status: DC
  Administered 2022-07-24: 324 mg via ORAL
  Filled 2022-07-22 (×3): qty 1

## 2022-07-22 MED ORDER — LACTATED RINGERS IV SOLN: 500.0000 mL | INTRAVENOUS | Status: DC | PRN

## 2022-07-22 MED ORDER — LIDOCAINE HCL (PF) 1 % IJ SOLN: 30.0000 mL | INTRAMUSCULAR | Status: DC | PRN

## 2022-07-22 MED ORDER — OXYTOCIN-SODIUM CHLORIDE 30-0.9 UT/500ML-% IV SOLN
2.5000 [IU]/h | INTRAVENOUS | Status: DC
  Administered 2022-07-23: 2.5 [IU]/h via INTRAVENOUS

## 2022-07-22 MED ORDER — LACTATED RINGERS IV SOLN: INTRAVENOUS | Status: DC

## 2022-07-22 MED ORDER — ONDANSETRON HCL 4 MG/2ML IJ SOLN: 4.0000 mg | Freq: Four times a day (QID) | INTRAMUSCULAR | Status: DC | PRN

## 2022-07-22 MED ORDER — ACETAMINOPHEN 325 MG PO TABS: 650.0000 mg | ORAL_TABLET | ORAL | Status: DC | PRN

## 2022-07-22 NOTE — Progress Notes (Signed)
Pt has been hold for IOL that was scheduled for 10/15 at midnight, has not felt baby move as mush as normal. Denies any VB, LOF.   NST reassuring and reactive, pt will come in this afternoon to see Dr Ernestina Patches unless she gets called to come in for her IOL.   Crosby Oyster, RN

## 2022-07-22 NOTE — H&P (Signed)
OBSTETRIC ADMISSION HISTORY AND PHYSICAL  Gloria Dean is a 36 y.o. female [phone] with IUP at [redacted]w[redacted]d by LMP presenting for eIOL. She reports +FMs, No LOF, no VB, no blurry vision, headaches or peripheral edema, and RUQ pain.  She plans on Breast feeding. She request POPs for birth control. She received her prenatal care at  [facility] .  Dating: By LMP --->  Estimated Date of Delivery: 07/25/22  Sono:   @32w , CWD, normal anatomy, cephalic presentation, 1956g, EFW  Prenatal History/Complications:  Patient Active Problem List   Diagnosis Date Noted   Term pregnancy 07/22/2022   History of pre-eclampsia in prior pregnancy, currently pregnant 06/17/2022   Anemia affecting pregnancy in third trimester 05/09/2022   History of LEEP (loop electrosurgical excision procedure) of cervix complicating pregnancy 02/28/2022   Previous recurrent miscarriages affecting pregnancy, antepartum 01/01/2022   AMA (advanced maternal age) multigravida 35+ 01/01/2022   Supervision of high risk pregnancy, antepartum 12/05/2021   History of severe cervical dysplasia (CIN III) 06/07/2021   Rh negative state in antepartum period 05/08/2013    Nursing Staff Provider  Office Location  [place] Dating  LMP  Language  ENGLISH Anatomy 07/08/2013  Normal at 19w  Flu Vaccine   Genetic/Carrier Screen  NIPS:   Low risk AFP:   Declined Horizon: Negative x 4  TDaP Vaccine   05/07/22 GTT Third trimester Nml 2 hr GTT  COVID Vaccine x2   LAB RESULTS   Rhogam   Blood Type AB/Negative/-- (03/28 1625)   Baby Feeding Plan Breast  Antibody Negative (03/28 1625)  Contraception Undecided Rubella 4.65 (03/28 1625)  Circumcision Yes  RPR Non Reactive (03/28 1625)   Pediatrician  [facility] HBsAg Negative (03/28 1625)   Support Person 07-[birth year]-1992 - husband  HCVAb Negative (03/28 1625)  Prenatal Classes no HIV Non Reactive (03/28 1625)       GBS       Pap Normal pap 01/31/22       DME Rx [x ] BP cuff [x ] Weight Scale Waterbirth  [ ]  Class [ ]   Consent [ ]  CNM visit  PHQ9 & GAD7 [  ] new OB [  ] 28 weeks  [  ] 36 weeks Induction  [ ]  Orders Entered [ ] Foley Y/N    Past Medical History: Past Medical History:  Diagnosis Date   Asthma with allergic rhinitis 08/07/2011   History of 2019 novel coronavirus disease (COVID-19) 10/2020   per pt mild symptoms that resolved   History of abnormal cervical Pap smear 2010   History of asthma    per pt as teen seasonal asthma but no issue since   History of chlamydia    History of genital warts 12/17/2011   Treated with TCA    History of pregnancy induced hypertension    preeclampsia   History of recurrent miscarriages    LGSIL on Pap smear of cervix 09/05/2011   Negative colpo--for repeat with HPV typing this pregnancy-Same pap--will repeat colpo--now wit LGSIL-->colpo--benign biopsy--repeat in 1 year.   Severe dysplasia of cervix (CIN III) 06/07/2021   06/04/21 Colposcopy Pathology after HGSIL pap showed CIN III >> LEEP 07/10/2021 showed CIN III with negative margins >> Pap 01/2022 Negative cytology, negative HPV . Needs annual pap and HPV testing every year for three years>>If all negative, then cotesting every three years for at least 25 years.   Wears contact lenses     Past Surgical History: Past Surgical History:  Procedure Laterality  Date   DILATION AND EVACUATION N/A 04/04/2021   Procedure: DILATATION AND EVACUATION with [name] testing;  Surgeon: Tereso Newcomer, MD;  Location: [facility];  Service: Gynecology;  Laterality: N/A;  [name] testing   WISDOM TOOTH EXTRACTION     teen    Obstetrical History: OB History     Gravida  7   Para  3   Term  3   Preterm  0   AB  3   Living  3      SAB  3   IAB  0   Ectopic  0   Multiple  0   Live Births  3           Social History Social History   Socioeconomic History   Marital status: Married    Spouse name: Not on file   Number of children: Not on file   Years of education: Not on file    Highest education level: Not on file  Occupational History   Not on file  Tobacco Use   Smoking status: Former    Years: 10.00    Types: Cigarettes    Quit date: 2012    Years since quitting: 11.7   Smokeless tobacco: Never   Tobacco comments:    socially  Building services engineer Use: Never used  Substance and Sexual Activity   Alcohol use: Not Currently    Comment: occasional   Drug use: Never   Sexual activity: Yes    Partners: Male    Birth control/protection: None  Other Topics Concern   Not on file  Social History Narrative   Not on file   Social Determinants of Health   Financial Resource Strain: Not on file  Food Insecurity: No Food Insecurity (07/22/2022)   Hunger Vital Sign    Worried About Running Out of Food in the Last Year: Never true    Ran Out of Food in the Last Year: Never true  Transportation Needs: No Transportation Needs (07/22/2022)   PRAPARE - Administrator, Civil Service (Medical): No    Lack of Transportation (Non-Medical): No  Physical Activity: Not on file  Stress: Not on file  Social Connections: Not on file    Family History: Family History  Problem Relation Age of Onset   Cancer Maternal Grandfather        lung    Allergies: No Known Allergies  Medications Prior to Admission  Medication Sig Dispense Refill Last Dose   ferrous gluconate (FERGON) 324 MG tablet Take 1 tablet (324 mg total) by mouth daily with breakfast. 30 tablet 3 07/22/2022   Prenatal Vit-Fe Fumarate-FA (MULTIVITAMIN-PRENATAL) 27-0.8 MG TABS tablet Take 1 tablet by mouth daily at 12 noon.   07/22/2022   progesterone (PROMETRIUM) 200 MG capsule TAKE 1 CAPSULE BY MOUTH EVERY DAY 30 capsule 3 07/22/2022   acetaminophen (TYLENOL) 500 MG tablet Take 500 mg by mouth every 6 (six) hours as needed.        Review of Systems   All systems reviewed and negative except as stated in HPI  Blood pressure 133/74, pulse 85, temperature 98 F (36.7 C), temperature  source Oral, resp. rate 17, height 5\' 3"  (1.6 m), weight 81.2 kg, last menstrual period 10/18/2021, unknown if currently breastfeeding. General appearance: alert, cooperative, and appears stated age Lungs: clear to auscultation bilaterally Heart: regular rate and rhythm Abdomen: soft, non-tender; bowel sounds normal Extremities: Homans sign is negative, no sign  of DVT Presentation: cephalic Fetal monitoringBaseline: 145 bpm, Variability: Good {> 6 bpm), Accelerations: Reactive, and Decelerations: Absent Uterine activityFrequency: Every 3-4 minutes Dilation: 1 Effacement (%): 70 Station: -2 Exam by:: Smalley RN   Prenatal labs: ABO, Rh: --/--/PENDING (10/16 2156) Antibody: PENDING (10/16 2156) Rubella: 4.65 (03/28 1625) RPR: Non Reactive (08/01 0902)  HBsAg: Negative (03/28 1625)  HIV: Non Reactive (08/01 0902)  GBS: Negative/-- (09/25 1600)  Glucola nml Genetic screening  mnl Anatomy US nml  Prenatal Transfer Tool  Maternal Diabetes: No Genetic Screening: Normal Maternal Ultrasounds/Referrals: Normal Fetal Ultrasounds or other Referrals:  None Maternal Substance Abuse:  No Significant Maternal Medications:  None Significant Maternal Lab Results:  Group B Strep negative Number of Prenatal Visits:greater than 3 verified prenatal visits Other Comments:  None  Results for orders placed or performed during the hospital encounter of 07/22/22 (from the past 24 hour(s))  CBC   Collection Time: 07/22/22  9:56 PM  Result Value Ref Range   WBC 16.0 (H) 4.0 - 10.5 K/uL   RBC 3.83 (L) 3.87 - 5.11 MIL/uL   Hemoglobin 12.0 12.0 - 15.0 g/dL   HCT 33.0 (L) 36.0 - 46.0 %   MCV 86.2 80.0 - 100.0 fL   MCH 31.3 26.0 - 34.0 pg   MCHC 36.4 (H) 30.0 - 36.0 g/dL   RDW 13.2 11.5 - 15.5 %   Platelets 198 150 - 400 K/uL   nRBC 0.0 0.0 - 0.2 %  Type and screen   Collection Time: 07/22/22  9:56 PM  Result Value Ref Range   ABO/RH(D) PENDING    Antibody Screen PENDING    Sample Expiration       07/25/2022,2359 Performed at [facility], [address], [place], [place] [address]     Patient Active Problem List   Diagnosis Date Noted   Term pregnancy 07/22/2022   History of pre-eclampsia in prior pregnancy, currently pregnant 06/17/2022   Anemia affecting pregnancy in third trimester 05/09/2022   History of LEEP (loop electrosurgical excision procedure) of cervix complicating pregnancy 32/44/0102   Previous recurrent miscarriages affecting pregnancy, antepartum 01/01/2022   AMA (advanced maternal age) multigravida 35+ 01/01/2022   Supervision of high risk pregnancy, antepartum 12/05/2021   History of severe cervical dysplasia (CIN III) 06/07/2021   Rh negative state in antepartum period 05/08/2013    Assessment/Plan:  KESI PERROW is a 36 y.o. [id number] at 105w4d here for eIOL  #Labor:Cyto 25/25 now, effaced well. May consider FB at next recheck if necessary. #Pain: IV support, family, epidural upon req #FWB: CAT 1 #ID:  GBS neg #MOF:  Breast #MOC: POPs #Circ:  YES #H/o Pre-E, now gHTN: Pre E labs collected. BP elevated in clinic and here today. CTM #Rh Neg: rhogam pp #h/o LEEP: was on [place], DO  07/22/2022, 10:55 PM

## 2022-07-22 NOTE — Progress Notes (Signed)
   PRENATAL VISIT NOTE  Subjective:  Gloria Dean is a 36 y.o. [phone] at [redacted]w[redacted]d being seen today for ongoing prenatal care.  She is currently monitored for the following issues for this high-risk pregnancy and has Rh negative state in antepartum period; History of severe cervical dysplasia (CIN III); Supervision of high risk pregnancy, antepartum; Previous recurrent miscarriages affecting pregnancy, antepartum; AMA (advanced maternal age) multigravida 35+; History of LEEP (loop electrosurgical excision procedure) of cervix complicating pregnancy; Anemia affecting pregnancy in third trimester; and History of pre-eclampsia in prior pregnancy, currently pregnant on their problem list.  Patient reports no complaints.  Contractions: Irritability.  .  Movement: Present. Denies leaking of fluid.   The following portions of the patient's history were reviewed and updated as appropriate: allergies, current medications, past family history, past medical history, past social history, past surgical history and problem list.   Objective:   Vitals:   07/22/22 1313  BP: (!) 143/89  Pulse: 94  Weight: 177 lb (80.3 kg)    Fetal Status:     Movement: Present     General:  Alert, oriented and cooperative. Patient is in no acute distress.  Skin: Skin is warm and dry. No rash noted.   Cardiovascular: Normal heart rate noted  Respiratory: Normal respiratory effort, no problems with respiration noted  Abdomen: Soft, gravid, appropriate for gestational age.  Pain/Pressure: Present     Pelvic: Cervical exam deferred        Extremities: Normal range of motion.     Mental Status: Normal mood and affect. Normal behavior. Normal judgment and thought content.   Assessment and Plan:  Pregnancy: [id number] at [redacted]w[redacted]d  1. History of pre-eclampsia in prior pregnancy, currently pregnant BP is creeping and today is >140/90 Repeat tomorrow PEC precautions  2. Multigravida of advanced maternal age in third  trimester   3. Supervision of high risk pregnancy, antepartum Discussed membrane sweeping today. Reviewed cochrane review data on membrane sweeping at 39 wks and then at EDD. Reviewed risk of cramping, contractions, bleeding and ROM. Answered patient questions and she agreed to proceed with procedure.  To hospital for IOL Repeat BP tomorrow if not inpatient  4. Rh negative state in antepartum period Rhogam PP  5. History of loop electrosurgical excision procedure (LEEP) of cervix affecting pregnancy in third trimester   Term labor symptoms and general obstetric precautions including but not limited to vaginal bleeding, contractions, leaking of fluid and fetal movement were reviewed in detail with the patient. Please refer to After Visit Summary for other counseling recommendations.   No follow-ups on file.  No future appointments.  Caren Macadam, MD

## 2022-07-23 ENCOUNTER — Encounter: Payer: 59 | Admitting: *Deleted

## 2022-07-23 ENCOUNTER — Encounter: Payer: 59 | Admitting: Obstetrics & Gynecology

## 2022-07-23 ENCOUNTER — Encounter (HOSPITAL_COMMUNITY): Payer: Self-pay | Admitting: Obstetrics and Gynecology

## 2022-07-23 DIAGNOSIS — Z3A39 39 weeks gestation of pregnancy: Secondary | ICD-10-CM

## 2022-07-23 DIAGNOSIS — O134 Gestational [pregnancy-induced] hypertension without significant proteinuria, complicating childbirth: Secondary | ICD-10-CM

## 2022-07-23 LAB — PROTEIN / CREATININE RATIO, URINE
Creatinine, Urine: 99 mg/dL
Protein Creatinine Ratio: 0.09 mg/mg{Cre} (ref 0.00–0.15)
Total Protein, Urine: 9 mg/dL

## 2022-07-23 LAB — RPR: RPR Ser Ql: NONREACTIVE

## 2022-07-23 MED ORDER — PHENYLEPHRINE 80 MCG/ML (10ML) SYRINGE FOR IV PUSH (FOR BLOOD PRESSURE SUPPORT): 80.0000 ug | PREFILLED_SYRINGE | INTRAVENOUS | Status: DC | PRN

## 2022-07-23 MED ORDER — TRANEXAMIC ACID-NACL 1000-0.7 MG/100ML-% IV SOLN
INTRAVENOUS | Status: AC
  Filled 2022-07-23: qty 100

## 2022-07-23 MED ORDER — SIMETHICONE 80 MG PO CHEW: 80.0000 mg | CHEWABLE_TABLET | ORAL | Status: DC | PRN

## 2022-07-23 MED ORDER — PRENATAL MULTIVITAMIN CH
1.0000 | ORAL_TABLET | Freq: Every day | ORAL | Status: DC
  Administered 2022-07-24: 1 via ORAL
  Filled 2022-07-23: qty 1

## 2022-07-23 MED ORDER — FENTANYL-BUPIVACAINE-NACL 0.5-0.125-0.9 MG/250ML-% EP SOLN: 12.0000 mL/h | EPIDURAL | Status: DC | PRN

## 2022-07-23 MED ORDER — WITCH HAZEL-GLYCERIN EX PADS: 1.0000 | MEDICATED_PAD | CUTANEOUS | Status: DC | PRN

## 2022-07-23 MED ORDER — EPHEDRINE 5 MG/ML INJ: 10.0000 mg | INTRAVENOUS | Status: DC | PRN

## 2022-07-23 MED ORDER — TERBUTALINE SULFATE 1 MG/ML IJ SOLN: 0.2500 mg | Freq: Once | INTRAMUSCULAR | Status: DC | PRN

## 2022-07-23 MED ORDER — TETANUS-DIPHTH-ACELL PERTUSSIS 5-2.5-18.5 LF-MCG/0.5 IM SUSY: 0.5000 mL | PREFILLED_SYRINGE | Freq: Once | INTRAMUSCULAR | Status: DC

## 2022-07-23 MED ORDER — ONDANSETRON HCL 4 MG PO TABS: 4.0000 mg | ORAL_TABLET | ORAL | Status: DC | PRN

## 2022-07-23 MED ORDER — SENNOSIDES-DOCUSATE SODIUM 8.6-50 MG PO TABS
2.0000 | ORAL_TABLET | ORAL | Status: DC
  Administered 2022-07-23: 2 via ORAL
  Filled 2022-07-23: qty 2

## 2022-07-23 MED ORDER — BENZOCAINE-MENTHOL 20-0.5 % EX AERO: 1.0000 | INHALATION_SPRAY | CUTANEOUS | Status: DC | PRN

## 2022-07-23 MED ORDER — ONDANSETRON HCL 4 MG/2ML IJ SOLN: 4.0000 mg | INTRAMUSCULAR | Status: DC | PRN

## 2022-07-23 MED ORDER — IBUPROFEN 600 MG PO TABS
600.0000 mg | ORAL_TABLET | Freq: Four times a day (QID) | ORAL | Status: DC
  Administered 2022-07-23 – 2022-07-24 (×4): 600 mg via ORAL
  Filled 2022-07-23 (×4): qty 1

## 2022-07-23 MED ORDER — COCONUT OIL OIL: 1.0000 | TOPICAL_OIL | Status: DC | PRN

## 2022-07-23 MED ORDER — TRANEXAMIC ACID-NACL 1000-0.7 MG/100ML-% IV SOLN
1000.0000 mg | INTRAVENOUS | Status: AC
  Administered 2022-07-23: 1000 mg via INTRAVENOUS

## 2022-07-23 MED ORDER — OXYTOCIN-SODIUM CHLORIDE 30-0.9 UT/500ML-% IV SOLN
1.0000 m[IU]/min | INTRAVENOUS | Status: DC
  Administered 2022-07-23: 2 m[IU]/min via INTRAVENOUS
  Filled 2022-07-23: qty 500

## 2022-07-23 MED ORDER — RHO D IMMUNE GLOBULIN 1500 UNIT/2ML IJ SOSY
300.0000 ug | PREFILLED_SYRINGE | Freq: Once | INTRAMUSCULAR | Status: AC
  Administered 2022-07-23: 300 ug via INTRAVENOUS
  Filled 2022-07-23: qty 2

## 2022-07-23 MED ORDER — ZOLPIDEM TARTRATE 5 MG PO TABS: 5.0000 mg | ORAL_TABLET | Freq: Every evening | ORAL | Status: DC | PRN

## 2022-07-23 MED ORDER — LACTATED RINGERS IV SOLN: 500.0000 mL | Freq: Once | INTRAVENOUS | Status: DC

## 2022-07-23 MED ORDER — DIPHENHYDRAMINE HCL 25 MG PO CAPS: 25.0000 mg | ORAL_CAPSULE | Freq: Four times a day (QID) | ORAL | Status: DC | PRN

## 2022-07-23 MED ORDER — DIPHENHYDRAMINE HCL 50 MG/ML IJ SOLN: 12.5000 mg | INTRAMUSCULAR | Status: DC | PRN

## 2022-07-23 MED ORDER — ACETAMINOPHEN 325 MG PO TABS
650.0000 mg | ORAL_TABLET | ORAL | Status: DC | PRN
  Administered 2022-07-23: 650 mg via ORAL
  Filled 2022-07-23: qty 2

## 2022-07-23 MED ORDER — INFLUENZA VAC SPLIT QUAD 0.5 ML IM SUSY
0.5000 mL | PREFILLED_SYRINGE | INTRAMUSCULAR | Status: AC
  Administered 2022-07-24: 0.5 mL via INTRAMUSCULAR
  Filled 2022-07-23: qty 0.5

## 2022-07-23 MED ORDER — DIBUCAINE (PERIANAL) 1 % EX OINT: 1.0000 | TOPICAL_OINTMENT | CUTANEOUS | Status: DC | PRN

## 2022-07-23 NOTE — Lactation Note (Signed)
This note was copied from a baby's chart. Lactation Consultation Note  Patient Name: Gloria Dean URKYH'C Date: 07/23/2022 Reason for consult: L&D Initial assessment;Term Age:37 hours   Initial L&D Consult:  Visited with family < 1 hour after birth Birth parent already had baby latched and he appeared to be feeding well.  Birth parent denied pain with latching.  Reassured parents that lactation will follow up on the M/B unit.  Support person at bedside.   Maternal Data    Feeding Mother's Current Feeding Choice: Breast Milk  LATCH Score Latch: Grasps breast easily, tongue down, lips flanged, rhythmical sucking.  Audible Swallowing: None  Type of Nipple:  (Did not observe due to birth parent having baby latched prior to my arrival)  Comfort (Breast/Nipple): Soft / non-tender  Hold (Positioning): No assistance needed to correctly position infant at breast.      Lactation Tools Discussed/Used    Interventions Interventions: Skin to skin  Discharge    Consult Status Consult Status: Follow-up from L&D    Brandilyn Nanninga R Ioanna Colquhoun 07/23/2022, 1:44 PM

## 2022-07-23 NOTE — Lactation Note (Signed)
This note was copied from a baby's chart. Lactation Consultation Note  Patient Name: Gloria Dean [id number]'[id number] Date: 07/23/2022 Reason for consult: Initial assessment;Term Age:37 hours Experienced BF mom states this baby is BF great. After delivery the baby BF for an hour. The baby has BF since then as well. Has spit up as well. Newborn feeding habits, behavior, STS, I&O. Mom encouraged to feed baby 8-12 times/24 hours and with feeding cues.   Encouraged mom to call for assistance or questions. Mom stated she doesn't have any questions or concerns at this time.  Maternal Data Does the patient have breastfeeding experience prior to this delivery?: Yes How long did the patient breastfeed?: 9 months with all of her other 3 children  Feeding    LATCH Score Latch: Grasps breast easily, tongue down, lips flanged, rhythmical sucking.  Audible Swallowing: A few with stimulation  Type of Nipple: Everted at rest and after stimulation  Comfort (Breast/Nipple): Soft / non-tender  Hold (Positioning): No assistance needed to correctly position infant at breast.  LATCH Score: 9   Lactation Tools Discussed/Used    Interventions Interventions: Breast feeding basics reviewed;LC Services brochure  Discharge    Consult Status Consult Status: PRN Date: 07/24/22 Follow-up type: In-patient    Theodoro Kalata 07/23/2022, 9:06 PM

## 2022-07-23 NOTE — Progress Notes (Addendum)
Labor Progress Note Gloria Dean is a 36 y.o. [phone] at [redacted]w[redacted]d presented for eIOL   S: Pt doing well  O:  BP 116/82   Pulse 84   Temp 97.8 F (36.6 C) (Oral)   Resp 17   Ht 5\' 3"  (1.6 m)   Wt 81.2 kg   LMP 10/18/2021   BMI 31.73 kg/m  EFM: 130 bpm/Moderate variability/ 15x15 accels/ None decels  CVE: Dilation: 1 Effacement (%): 70 Station: -2 Exam by:: Smalley RN   A&P: 36 y.o. [id number] [redacted]w[redacted]d IOL #Labor: Progressing well. Well effaced. Pit started #Pain: IV pain meds, support, epidural upon request #FWB: CAT 1 #GBS negative  [name] Q Mercado-Ortiz, DO 6:09 AM

## 2022-07-23 NOTE — Discharge Summary (Signed)
   Postpartum Discharge Summary  Date of Service updated***     Patient Name: Gloria Dean DOB: 08/03/1986 MRN: [id number]  Date of admission: 07/22/2022 Delivery date:07/23/2022  Delivering provider:   Date of discharge: 07/23/2022  Admitting diagnosis: Term pregnancy [Z34.90] Intrauterine pregnancy: [redacted]w[redacted]d     Secondary diagnosis:  Principal Problem:   Term pregnancy Active Problems:   Rh negative state in antepartum period   Supervision of high risk pregnancy, antepartum   AMA (advanced maternal age) multigravida 35+   Anemia affecting pregnancy in third trimester   History of pre-eclampsia in prior pregnancy, currently pregnant   Vaginal delivery  Additional problems: None    Discharge diagnosis: Term Pregnancy Delivered                                              Post partum procedures:{Postpartum procedures:23558} Augmentation: Pitocin and Cytotec Complications: None  Hospital course: Induction of Labor With Vaginal Delivery   36 y.o. yo [id number] at [redacted]w[redacted]d was admitted to the hospital 07/22/2022 for induction of labor.  Indication for induction: Postdates and Gestational hypertension.  Patient had an uncomplicated labor course.  Membrane Rupture Time/Date: 12:08 PM ,07/23/2022   Delivery Method:Vaginal, Spontaneous  Episiotomy: None  Lacerations:  None  Details of delivery can be found in separate delivery note.  Patient had a postpartum course complicated by***. Patient is discharged home 07/23/22.  Newborn Data: Birth date:07/23/2022  Birth time:1:11 PM  Gender:Female  Living status:Living  Apgars: ,  Weight:   Magnesium Sulfate received: No BMZ received: No Rhophylac:{Rhophylac received:30440032} MMR:No T-DaP:Given prenatally Flu: {Flu:23963} Transfusion:{Transfusion received:30440034}  Physical exam  Vitals:   07/23/22 0746 07/23/22 0830 07/23/22 0900 07/23/22 1000  BP: 133/86 108/66 107/71 122/78  Pulse: 86 72 95 90  Resp: 18 16 16   Temp:       TempSrc:      Weight:      Height:       General: {Exam; general:21111117} Lochia: {Desc; appropriate/inappropriate:30686::"appropriate"} Uterine Fundus: {Desc; firm/soft:30687} Incision: {Exam; incision:21111123} DVT Evaluation: {Exam; dvt:2111122} Labs: Lab Results  Component Value Date   WBC 16.0 (H) 07/22/2022   HGB 12.0 07/22/2022   HCT 33.0 (L) 07/22/2022   MCV 86.2 07/22/2022   PLT 198 07/22/2022      Latest Ref Rng & Units 07/22/2022    9:56 PM  CMP  Glucose 70 - 99 mg/dL 98   BUN 6 - 20 mg/dL 5   Creatinine 0.44 - 1.00 mg/dL 0.51   Sodium 135 - 145 mmol/L 135   Potassium 3.5 - 5.1 mmol/L 3.4   Chloride 98 - 111 mmol/L 104   CO2 22 - 32 mmol/L 21   Calcium 8.9 - 10.3 mg/dL 8.8   Total Protein 6.5 - 8.1 g/dL 5.8   Total Bilirubin 0.3 - 1.2 mg/dL 0.2   Alkaline Phos 38 - 126 U/L 128   AST 15 - 41 U/L 22   ALT 0 - 44 U/L 12    Edinburgh Score:    10/21/2019    2:08 PM  Edinburgh Postnatal Depression Scale Screening Tool  I have been able to laugh and see the funny side of things. 0  I have looked forward with enjoyment to things. 0  I have blamed myself unnecessarily when things went wrong. 1  I have been anxious or worried for no   good reason. 0  I have felt scared or panicky for no good reason. 0  Things have been getting on top of me. 0  I have been so unhappy that I have had difficulty sleeping. 0  I have felt sad or miserable. 0  I have been so unhappy that I have been crying. 0  The thought of harming myself has occurred to me. 0  Edinburgh Postnatal Depression Scale Total 1     After visit meds:  Allergies as of 07/23/2022   No Known Allergies   Med Rec must be completed prior to using this SMARTLINK***        Discharge home in stable condition Infant Feeding: {Baby feeding:23562} Infant Disposition:{CHL IP OB HOME WITH MOTHER:23581} Discharge instruction: per After Visit Summary and Postpartum booklet. Activity: Advance as  tolerated. Pelvic rest for 6 weeks.  Diet: {OB diet:21111121} Future Appointments:No future appointments. Follow up Visit:  Message sent 07/23/22  Please schedule this patient for a In person postpartum visit in 4 weeks with the following provider: Any provider. Additional Postpartum F/U:BP check 1 week  High risk pregnancy complicated by: HTN Delivery mode:  Vaginal, Spontaneous  Anticipated Birth Control:  Unsure   07/23/2022 John V Cresenzo, MD    

## 2022-07-24 ENCOUNTER — Other Ambulatory Visit (HOSPITAL_COMMUNITY): Payer: Self-pay

## 2022-07-24 LAB — RH IG WORKUP (INCLUDES ABO/RH)
Fetal Screen: NEGATIVE
Gestational Age(Wks): 39.5
Unit division: 0

## 2022-07-24 MED ORDER — FUROSEMIDE 20 MG PO TABS
20.0000 mg | ORAL_TABLET | Freq: Every day | ORAL | 0 refills | Status: DC
  Filled 2022-07-24: qty 4, 4d supply, fill #0

## 2022-07-24 MED ORDER — IBUPROFEN 600 MG PO TABS
600.0000 mg | ORAL_TABLET | Freq: Four times a day (QID) | ORAL | 0 refills | Status: AC
  Filled 2022-07-24: qty 30, 8d supply, fill #0

## 2022-07-24 MED ORDER — ACETAMINOPHEN 325 MG PO TABS
650.0000 mg | ORAL_TABLET | ORAL | 0 refills | Status: AC | PRN
  Filled 2022-07-24: qty 30, 3d supply, fill #0

## 2022-07-24 MED ORDER — FUROSEMIDE 20 MG PO TABS
20.0000 mg | ORAL_TABLET | Freq: Every day | ORAL | Status: DC
  Administered 2022-07-24: 20 mg via ORAL
  Filled 2022-07-24: qty 1

## 2022-07-30 ENCOUNTER — Ambulatory Visit (INDEPENDENT_AMBULATORY_CARE_PROVIDER_SITE_OTHER): Payer: 59 | Admitting: *Deleted

## 2022-07-30 DIAGNOSIS — Z0130 Encounter for examination of blood pressure without abnormal findings: Secondary | ICD-10-CM

## 2022-07-30 DIAGNOSIS — O09299 Supervision of pregnancy with other poor reproductive or obstetric history, unspecified trimester: Secondary | ICD-10-CM

## 2022-07-30 NOTE — Progress Notes (Signed)
Subjective:  Gloria Dean is a 36 y.o. female here for BP check.   Hypertension ROS: Patient denies any headaches, visual symptoms, RUQ/epigastric pain or other concerning symptoms.  Objective:  BP 126/82   Pulse 81   Breastfeeding Yes   Appearance alert, well appearing, and in no distress. General exam BP noted to be well controlled today in office.    Assessment:   Blood Pressure well controlled.   Plan:  Follow up as needed and or at postpartum visit.  Crosby Oyster, RN .

## 2022-08-13 NOTE — Progress Notes (Unsigned)
erroneous  This encounter was created in error - please disregard.

## 2022-08-26 ENCOUNTER — Ambulatory Visit (INDEPENDENT_AMBULATORY_CARE_PROVIDER_SITE_OTHER): Payer: 59 | Admitting: Family Medicine

## 2022-08-26 ENCOUNTER — Encounter: Payer: Self-pay | Admitting: Family Medicine

## 2022-08-26 DIAGNOSIS — Z87410 Personal history of cervical dysplasia: Secondary | ICD-10-CM

## 2022-08-26 NOTE — Progress Notes (Signed)
Post Partum Visit Note  Gloria Dean is a 36 y.o. [id number] female who presents for a postpartum visit. She is 4 weeks postpartum following a vaginal delivery.  I have fully reviewed the prenatal and intrapartum course. The delivery was at 39w gestational weeks.  Anesthesia: none. Postpartum course has been uncomplicated. Baby is doing well. Baby is feeding by breast. Bleeding staining only. Bowel function is normal. Bladder function is normal. Patient is not sexually active. Contraception method is vasectomy. Postpartum depression screening: negative.   The pregnancy intention screening data noted above was reviewed. Potential methods of contraception were discussed. The patient elected to proceed with No data recorded.   Edinburgh Postnatal Depression Scale - 08/26/22 1628       Edinburgh Postnatal Depression Scale:  In the Past 7 Days   I have been able to laugh and see the funny side of things. 0    I have looked forward with enjoyment to things. 0    I have blamed myself unnecessarily when things went wrong. 0    I have been anxious or worried for no good reason. 0    I have felt scared or panicky for no good reason. 0    Things have been getting on top of me. 0    I have been so unhappy that I have had difficulty sleeping. 0    I have felt sad or miserable. 0    I have been so unhappy that I have been crying. 0    The thought of harming myself has occurred to me. 0    Edinburgh Postnatal Depression Scale Total 0             Health Maintenance Due  Topic Date Due   COVID-19 Vaccine (1) Never done    The following portions of the patient's history were reviewed and updated as appropriate: allergies, current medications, past family history, past medical history, past social history, past surgical history, and problem list.  Review of Systems Pertinent items are noted in HPI.  Objective:  BP 111/74   Pulse 68   Ht 5\' 3"  (1.6 m)   Wt 158 lb (71.7 kg)    Breastfeeding Yes   BMI 27.99 kg/m    General:  alert, cooperative, and appears stated age   Breasts:  not indicated  Lungs: clear to auscultation bilaterally  Heart:  regular rate and rhythm, S1, S2 normal, no murmur, click, rub or gallop  Abdomen: soft, non-tender; bowel sounds normal; no masses,  no organomegaly   Wound NA  GU exam:  not indicated       Assessment:   Normal postpartum exam.   Plan:   Essential components of care per ACOG recommendations:  1.  Mood and well being: Patient with negative depression screening today. Reviewed local resources for support.  - Patient tobacco use? No.   - hx of drug use? No.    2. Infant care and feeding:  -Patient currently breastmilk feeding? Yes. Reviewed importance of draining breast regularly to support lactation.   Patient is having problems draining breast with pump on the right. Gets 5-6 oz on Left but then right only gets 4oz and still feels full. She reports trying several pumps, resizing flanges, la vie and manual breast massage. Has also tried manual pump. She reports infant drains her breast very well. She is only pumping to help stock up for her return to work in Jan 2024.   I recommended an  appt with LC to watch pumping session and assure appropriate sizing of flanges etc. Patient asking about pumping pals- unsure if this will help her breast to drain better with pumping. I also discussed possibly trying to rent hospital grade pump. LC can also help with this process if needed   -Social determinants of health (SDOH) reviewed in EPIC. No concerns  3. Sexuality, contraception and birth spacing - Patient does not want a pregnancy in the next year.  Desired family size is 3 children.  - Reviewed reproductive life planning. Reviewed contraceptive methods based on pt preferences and effectiveness.  Patient desired Vasectomy today.   - Discussed birth spacing of 18 months  4. Sleep and fatigue -Encouraged  family/partner/community support of 4 hrs of uninterrupted sleep to help with mood and fatigue  5. Physical Recovery  - Discussed patients delivery and complications. She describes her labor as good. - Patient had a Vaginal, no problems at delivery. Patient had  no  laceration. Perineal healing reviewed. Patient expressed understanding - Patient has urinary incontinence? No. - Patient is safe to resume physical and sexual activity  6.  Health Maintenance - HM due items addressed Yes - Last pap smear  Diagnosis  Date Value Ref Range Status  01/31/2022   Final   - Negative for Intraepithelial Lesions or Malignancy (NILM)  01/31/2022 - Benign reactive/reparative changes  Final   Pap smear not done at today's visit.  -Breast Cancer screening indicated? No.   7. Chronic Disease/Pregnancy Condition follow up:  gHTN  #gHTN: BP today is WNL  #Desire for RSV vaccination for infant- she is on waiting list at peds office. Discussed calling health department and limited supply for infants with CDC recommends to triage to  highest risk infants until supply improves.   #HSIL, s/p LEEP: last pap was NIL/ HPV NEG. Repeat pap in April 2024  - PCP follow up  Return in about 19 weeks (around 01/06/2023) for pap.  No future appointments.   Federico Flake, MD [facility] for [facility], [facility]

## 2022-08-30 ENCOUNTER — Other Ambulatory Visit: Payer: Self-pay | Admitting: Obstetrics & Gynecology

## 2022-09-21 ENCOUNTER — Other Ambulatory Visit: Payer: Self-pay | Admitting: Obstetrics & Gynecology

## 2022-09-21 DIAGNOSIS — O99013 Anemia complicating pregnancy, third trimester: Secondary | ICD-10-CM

## 2022-10-14 ENCOUNTER — Encounter: Payer: Self-pay | Admitting: Family Medicine

## 2022-10-15 ENCOUNTER — Encounter: Payer: Self-pay | Admitting: *Deleted

## 2023-02-11 ENCOUNTER — Other Ambulatory Visit: Payer: Self-pay | Admitting: Neurology

## 2023-02-11 DIAGNOSIS — R42 Dizziness and giddiness: Secondary | ICD-10-CM

## 2023-02-19 ENCOUNTER — Ambulatory Visit (INDEPENDENT_AMBULATORY_CARE_PROVIDER_SITE_OTHER): Payer: 59 | Admitting: Obstetrics and Gynecology

## 2023-02-19 ENCOUNTER — Other Ambulatory Visit (HOSPITAL_COMMUNITY)
Admission: RE | Admit: 2023-02-19 | Discharge: 2023-02-19 | Disposition: A | Discharge status: Home / Self Care | Payer: 59 | Source: Ambulatory Visit | Attending: Obstetrics and Gynecology | Admitting: Obstetrics and Gynecology

## 2023-02-19 ENCOUNTER — Encounter: Payer: Self-pay | Admitting: Obstetrics and Gynecology

## 2023-02-19 VITALS — BP 120/76 | HR 73 | Wt 155.0 lb

## 2023-02-19 DIAGNOSIS — Z87410 Personal history of cervical dysplasia: Secondary | ICD-10-CM

## 2023-02-19 DIAGNOSIS — Z01419 Encounter for gynecological examination (general) (routine) without abnormal findings: Secondary | ICD-10-CM

## 2023-02-19 NOTE — Progress Notes (Signed)
Patient presents for Annual.  LMP: Breastfeeding   Last pap: Date: 01/31/22 Hx of abnormal, S/P leep  Contraception:  Breastfeeding  Mammogram: Not yet indicated STD Screening: Declines Flu Vaccine : Declines  CC:  Annual

## 2023-02-19 NOTE — Progress Notes (Signed)
Obstetrics and Gynecology Annual Patient Evaluation  Appointment Date: 02/19/2023  OBGYN Clinic: [facility] for [facility]  Chief Complaint:  Chief Complaint  Patient presents with   Gynecologic Exam    History of Present Illness: Gloria Dean is a 37 y.o. [id number] (LMP: amenorrheic, still breastfeeding/pumping), seen for the above chief complaint. Her past medical history is significant for h/o CIN3 and LEEP  No GYN issues or problems today. Patient being worked up by neurology for dizziness spells, no LOC  Review of Systems: Pertinent items noted in HPI and remainder of comprehensive ROS otherwise negative.   Past Medical History:  Past Medical History:  Diagnosis Date   Asthma with allergic rhinitis 08/07/2011   History of 2019 novel coronavirus disease (COVID-19) 10/2020   per pt mild symptoms that resolved   History of abnormal cervical Pap smear 2010   History of asthma    per pt as teen seasonal asthma but no issue since   History of chlamydia    History of genital warts 12/17/2011   Treated with TCA    History of pregnancy induced hypertension    preeclampsia   History of recurrent miscarriages    LGSIL on Pap smear of cervix 09/05/2011   Negative colpo--for repeat with HPV typing this pregnancy-Same pap--will repeat colpo--now wit LGSIL-->colpo--benign biopsy--repeat in 1 year.   Severe dysplasia of cervix (CIN III) 06/07/2021   06/04/21 Colposcopy Pathology after HGSIL pap showed CIN III >> LEEP 07/10/2021 showed CIN III with negative margins >> Pap 01/2022 Negative cytology, negative HPV . Needs annual pap and HPV testing every year for three years>>If all negative, then cotesting every three years for at least 25 years.   Wears contact lenses     Past Surgical History:  Past Surgical History:  Procedure Laterality Date   DILATION AND EVACUATION N/A 04/04/2021   Procedure: DILATATION AND EVACUATION with [name] testing;  Surgeon: Tereso Newcomer,  MD;  Location: [facility];  Service: Gynecology;  Laterality: N/A;  [name] testing   WISDOM TOOTH EXTRACTION     teen    Past Obstetrical History:  OB History  Gravida Para Term Preterm AB Living  7 4 4  0 3 4  SAB IAB Ectopic Multiple Live Births  3 0 0 0 4    # Outcome Date GA Lbr Len/2nd Weight Sex Delivery Anes PTL Lv  7 Term 07/23/22 [redacted]w[redacted]d / 00:02 7 lb 12.9 oz (3.54 kg) M Vag-Spont None  LIV     Birth Comments: Hugs 008, Band [id number]  6 SAB 04/03/21 [redacted]w[redacted]d         5 SAB 01/2021          4 SAB 04/2020          3 Term 09/16/19 [redacted]w[redacted]d 05:10 / 00:04 8 lb 5.3 oz (3.779 kg) M Vag-Spont None  LIV  2 Term 11/15/13 [redacted]w[redacted]d 17:45 / 00:02 6 lb 6 oz (2.892 kg) F Vag-Spont None  LIV  1 Term 02/28/12 [redacted]w[redacted]d 09:35 / 00:14 5 lb 6.8 oz (2.46 kg) M Vag-Spont None  LIV    Past Gynecological History: As per HPI. History of Pap Smear(s):  01/2022: pap and hpv negative 07/2021 LEEP CIN 3 with neg margins; no post leep ecc done 04/2021: HSIL She is currently using vasectomy for contraception.   Social History:  Social History   Socioeconomic History   Marital status: Married    Spouse name: Not on file   Number of children:  Not on file   Years of education: Not on file   Highest education level: Not on file  Occupational History   Not on file  Tobacco Use   Smoking status: Former    Years: 10    Types: Cigarettes    Quit date: 2012    Years since quitting: 12.3   Smokeless tobacco: Never   Tobacco comments:    socially  Building services engineer Use: Never used  Substance and Sexual Activity   Alcohol use: Not Currently    Comment: occasional   Drug use: Never   Sexual activity: Yes    Partners: Male    Birth control/protection: None  Other Topics Concern   Not on file  Social History Narrative   Not on file   Social Determinants of Health   Financial Resource Strain: Not on file  Food Insecurity: No Food Insecurity (07/22/2022)   Hunger Vital Sign    Worried About  Running Out of Food in the Last Year: Never true    Ran Out of Food in the Last Year: Never true  Transportation Needs: No Transportation Needs (07/22/2022)   PRAPARE - Administrator, Civil Service (Medical): No    Lack of Transportation (Non-Medical): No  Physical Activity: Not on file  Stress: Not on file  Social Connections: Not on file  Intimate Partner Violence: Not At Risk (07/22/2022)   Humiliation, Afraid, Rape, and Kick questionnaire    Fear of Current or Ex-Partner: No    Emotionally Abused: No    Physically Abused: No    Sexually Abused: No    Family History:  Family History  Problem Relation Age of Onset   Cancer Maternal Grandfather        lung   She denies any h/o breast cancer; she states mom and sister had negative blood genetic testing  Medications None  Allergies Patient has no known allergies.   Physical Exam:  BP 120/76   Pulse 73   Wt 155 lb (70.3 kg)   Breastfeeding Yes   BMI 27.46 kg/m  Body mass index is 27.46 kg/m. General appearance: Well nourished, well developed female in no acute distress.  Neck:  Supple, normal appearance, and no thyromegaly  Cardiovascular: normal s1 and s2.  No murmurs, rubs or gallops. Respiratory:  Clear to auscultation bilateral. Normal respiratory effort Abdomen: positive bowel sounds and no masses, hernias; diffusely non tender to palpation, non distended Breasts: patient denies any s/s Neuro/Psych:  Normal mood and affect.  Skin:  Warm and dry.  Lymphatic:  No inguinal lymphadenopathy.   Cervical exam performed in the presence of a chaperone Pelvic exam: is not limited by body habitus EGBUS: within normal limits Vagina: within normal limits and with no blood or discharge in the vault Cervix: normal appearing cervix without tenderness, discharge or lesions Uterus:  nonenlarged and non tender Adnexa:  normal adnexa and no mass, fullness, tenderness Rectovaginal: deferred  Laboratory:  none  Radiology: none  Assessment: patient doing well  Plan:  1. History of severe cervical dysplasia (CIN III) Follow up pap and hpv testing for surveillance recommendations - Cytology - PAP  2. Well woman exam with routine gynecological exam Confirmatory semen analysis not done yet s/p vasectomy; I d/w her that there still is a risk of pregnancy even with no period given her breastfeeding frequency   No follow-ups on file.  Future Appointments  Date Time Provider Department Center  02/21/2023  2:40 PM  [facility] [place] [facility]    Cornelia Copa MD Attending [facility] for [facility])

## 2023-02-21 ENCOUNTER — Ambulatory Visit
Admission: RE | Admit: 2023-02-21 | Discharge: 2023-02-21 | Disposition: A | Discharge status: Home / Self Care | Payer: 59 | Source: Ambulatory Visit | Attending: Neurology | Admitting: Neurology

## 2023-02-21 DIAGNOSIS — R42 Dizziness and giddiness: Secondary | ICD-10-CM

## 2023-02-21 MED ORDER — GADOPICLENOL 0.5 MMOL/ML IV SOLN
7.5000 mL | Freq: Once | INTRAVENOUS | Status: AC | PRN
  Administered 2023-02-21: 7.5 mL via INTRAVENOUS

## 2023-02-24 LAB — CYTOLOGY - PAP
Comment: NEGATIVE
Diagnosis: NEGATIVE
Diagnosis: REACTIVE
High risk HPV: NEGATIVE

## 2023-03-27 IMAGING — US US PELVIS COMPLETE WITH TRANSVAGINAL
1 series · 15 of 25 positions shown · non-contrast
Comparison: None

CLINICAL DATA: Recurrent miscarriages, G5P3, LMP 01/21/2021;
patient states positive urine pregnancy test 1 week ago

EXAM:
TRANSABDOMINAL AND TRANSVAGINAL ULTRASOUND OF PELVIS
TECHNIQUE: Both transabdominal and transvaginal ultrasound examinations of the
pelvis were performed. Transabdominal technique was performed for
global imaging of the pelvis including uterus, ovaries, adnexal
regions, and pelvic cul-de-sac. It was necessary to proceed with
endovaginal exam following the transabdominal exam to visualize the
lower uterine segment and adnexa.

[Series 1: us pelvis complete with transvaginal · 15 of 125 slices shown]
[im 1/125]
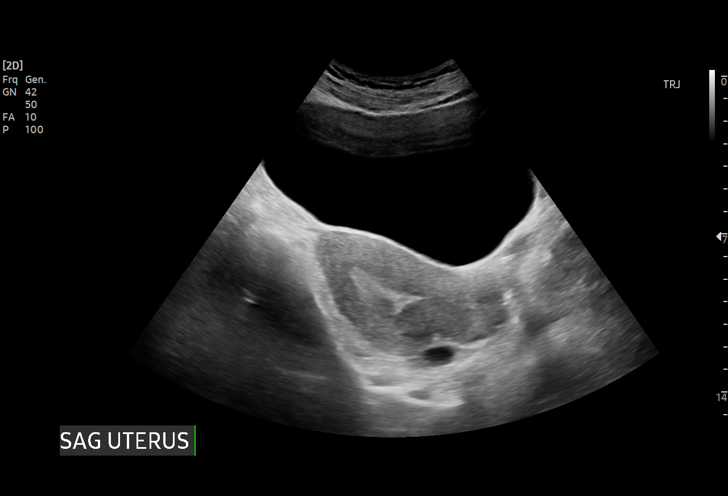
[im 11/125]
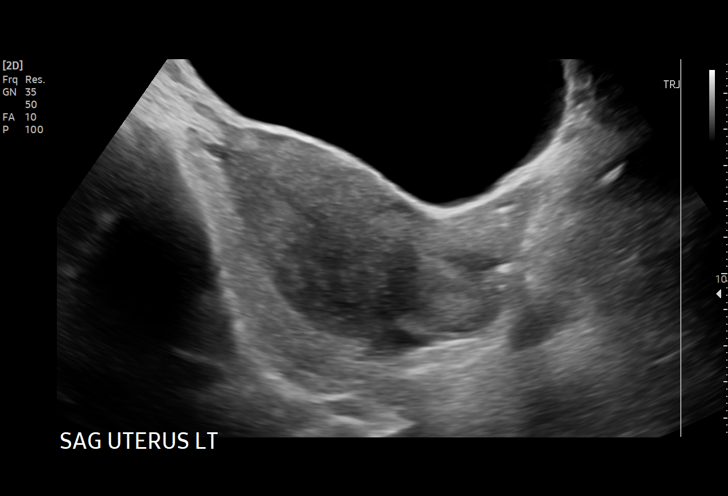
[im 21/125]
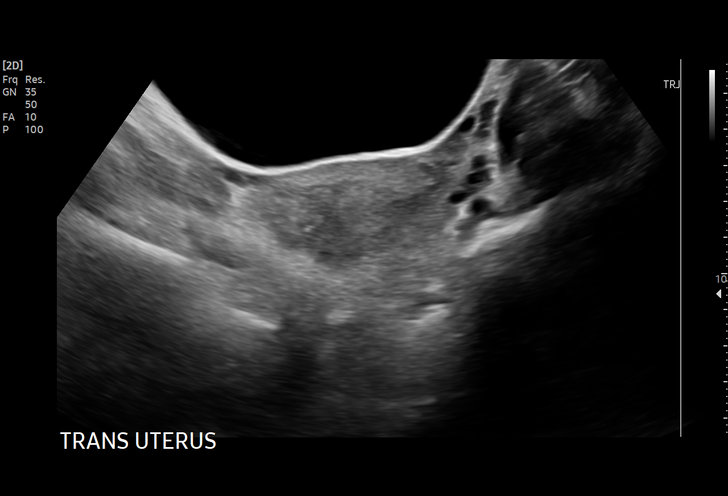
[im 26/125]
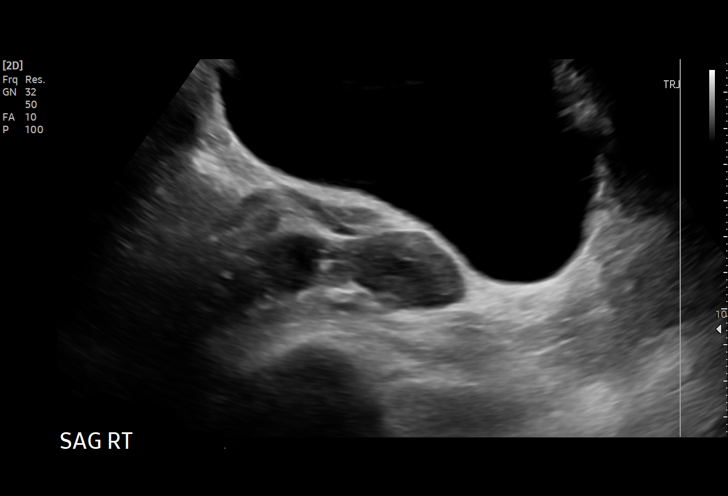
[im 37/125]
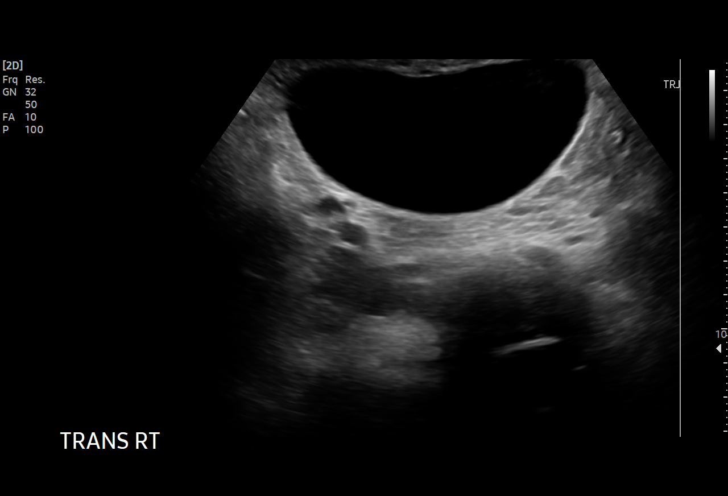
[im 47/125]
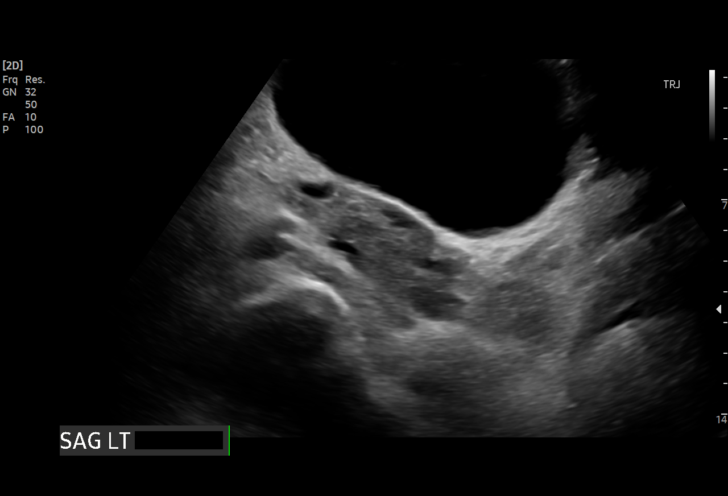
[im 52/125]
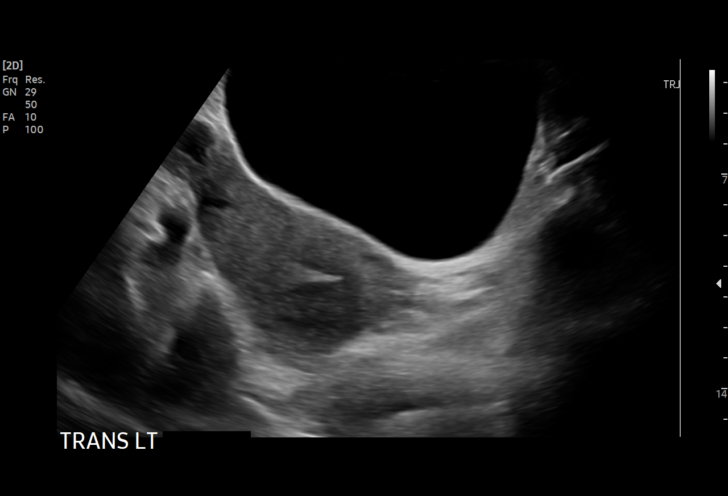
[im 63/125]
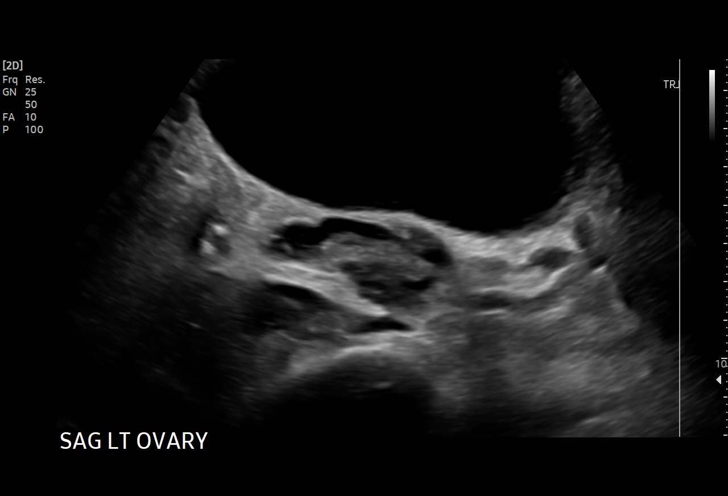
[im 73/125]
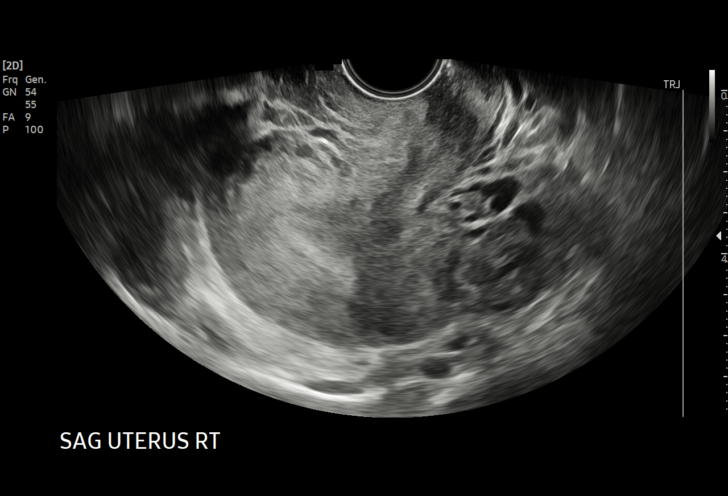
[im 78/125]
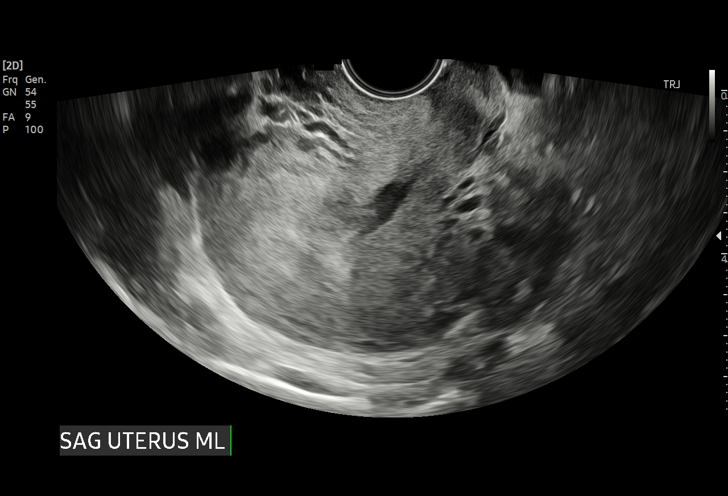
[im 88/125]
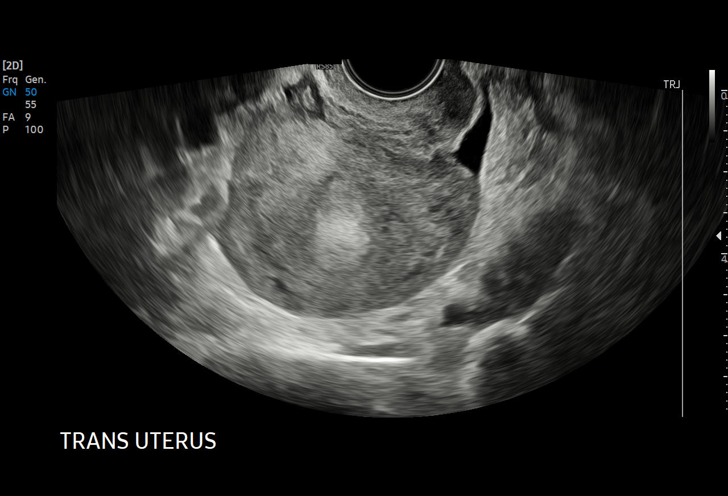
[im 99/125]
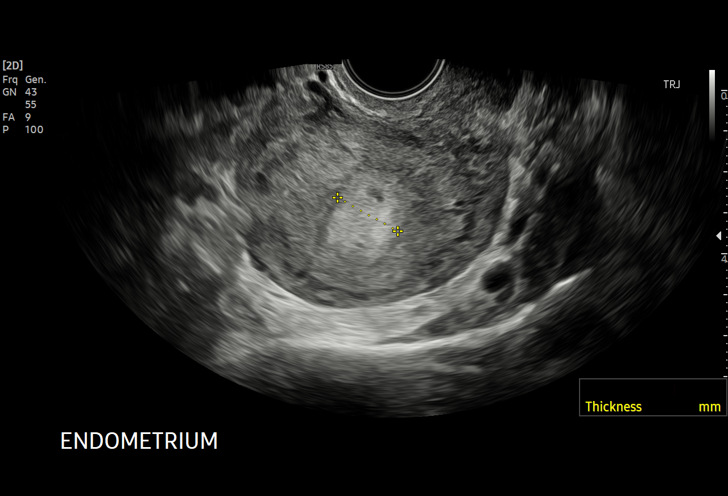
[im 104/125]
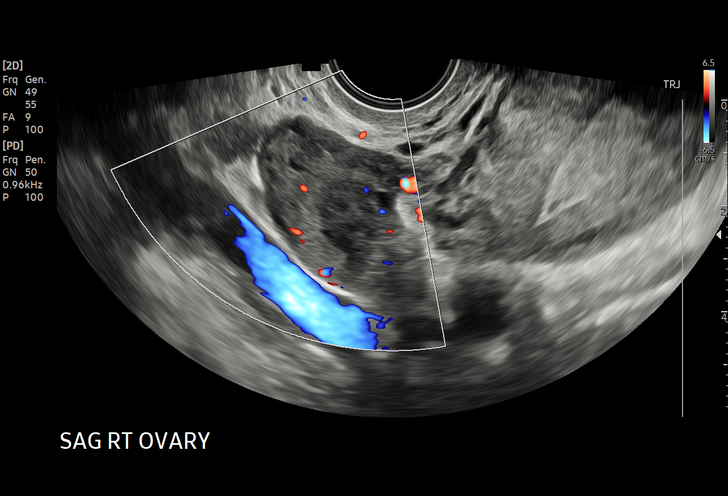
[im 114/125]
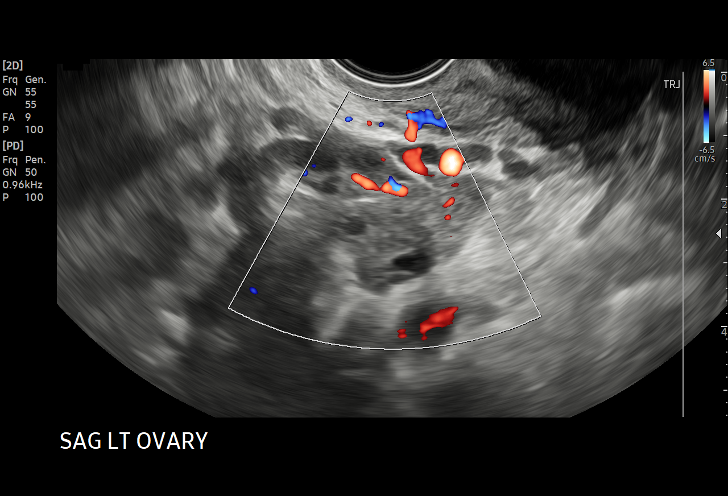
[im 125/125]
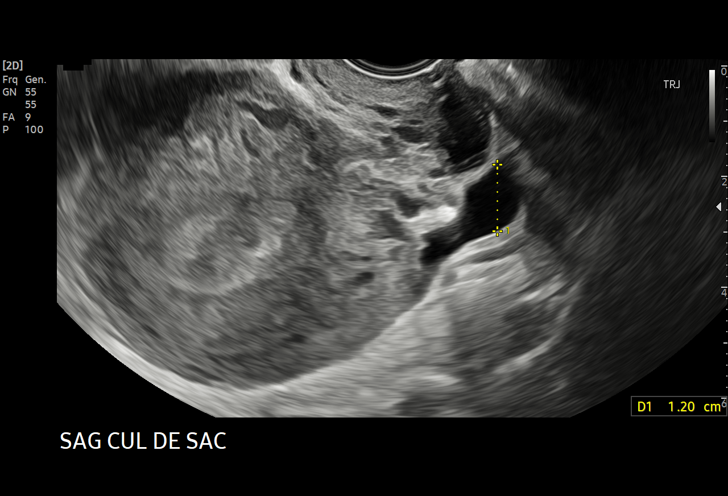

[15 of 25 positions shown; findings below may reference images not displayed]

FINDINGS: Uterus

Measurements: 9.0 x 4.0 x 6.5 cm = volume: 121 mL. Anteverted.
Normal morphology without mass

Endometrium

Thickness: 17 mm. Small amount of endometrial fluid. Tiny rounded
fluid collection within the endometrium 2 mm diameter, could
represent a tiny endometrial cyst, tiny gestational sac not
excluded.

Right ovary

Measurements: 2.5 x 3.9 x 2.5 cm = volume: 12.8 mL. Normal
morphology without mass

Left ovary

Measurements: 2.4 x 1.5 x 2.3 cm = volume: 4.3 mL. Normal morphology
without mass

Other findings

Small amount of nonspecific free pelvic fluid.  No adnexal masses.
IMPRESSION: 2 mm rounded fluid collection within the endometrium, could
represent a tiny endometrial cyst or a tiny gestational sac;
recommend correlation with beta HCG.

Small amount of nonspecific endometrial fluid otherwise seen.

Remainder of exam normal.

## 2023-04-23 IMAGING — US US OB LIMITED
1 series · 7 of 7 positions shown · non-contrast
Comparison: none

[Series 1: us ob limited · 7 of 7 slices shown]
[im 1/7]
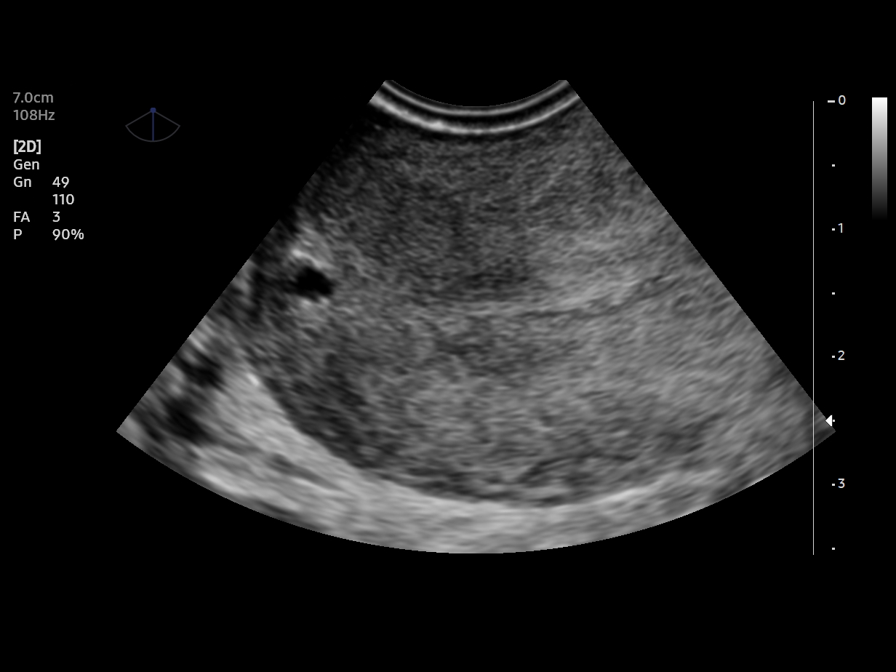
[im 2/7]
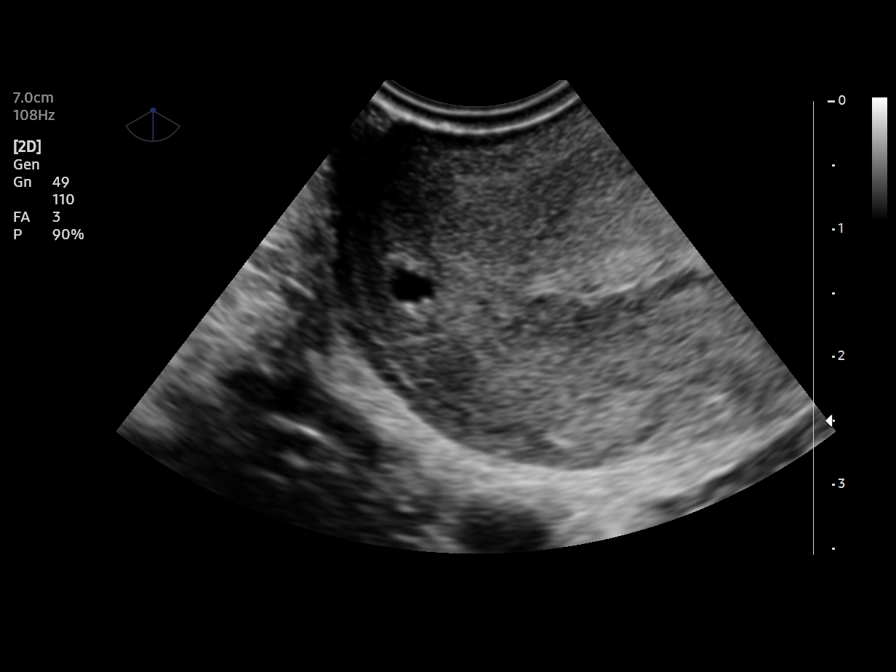
[im 3/7]
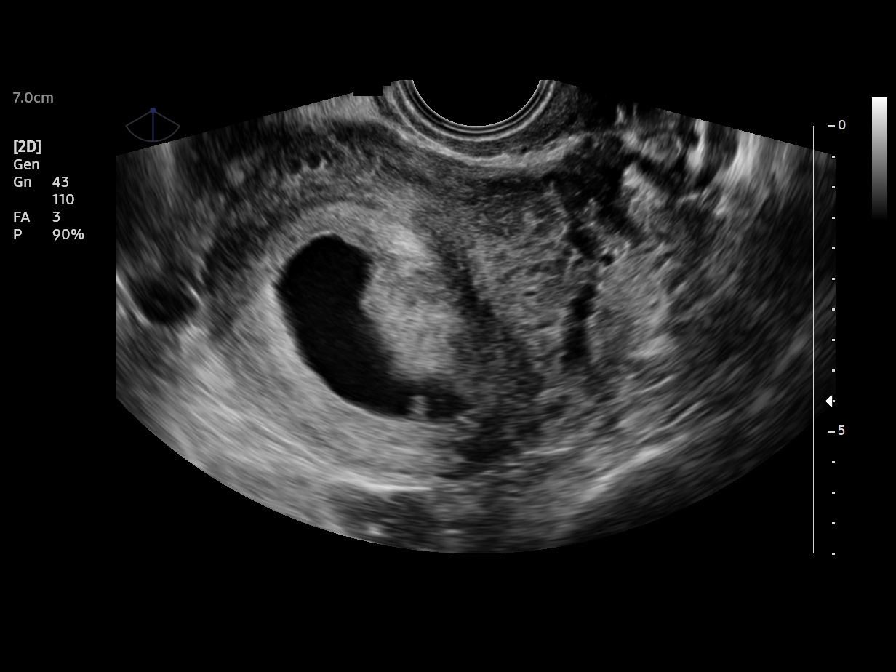
[im 4/7]
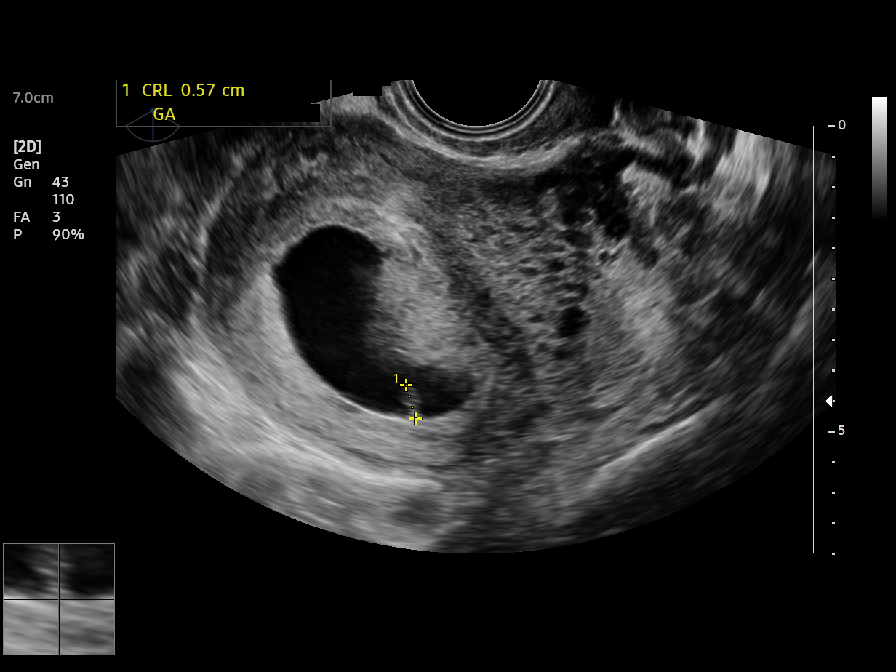
[im 5/7]
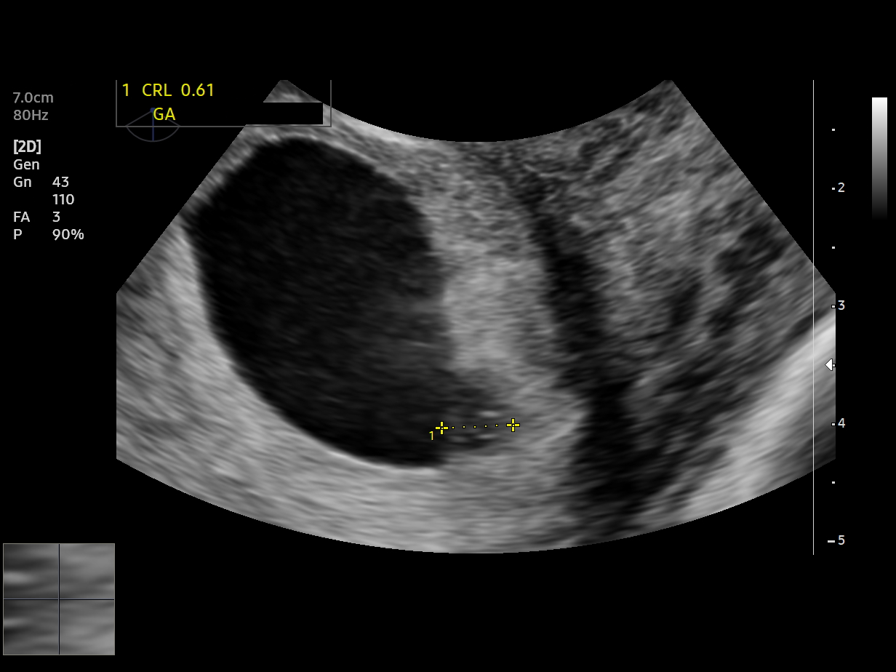
[im 6/7]
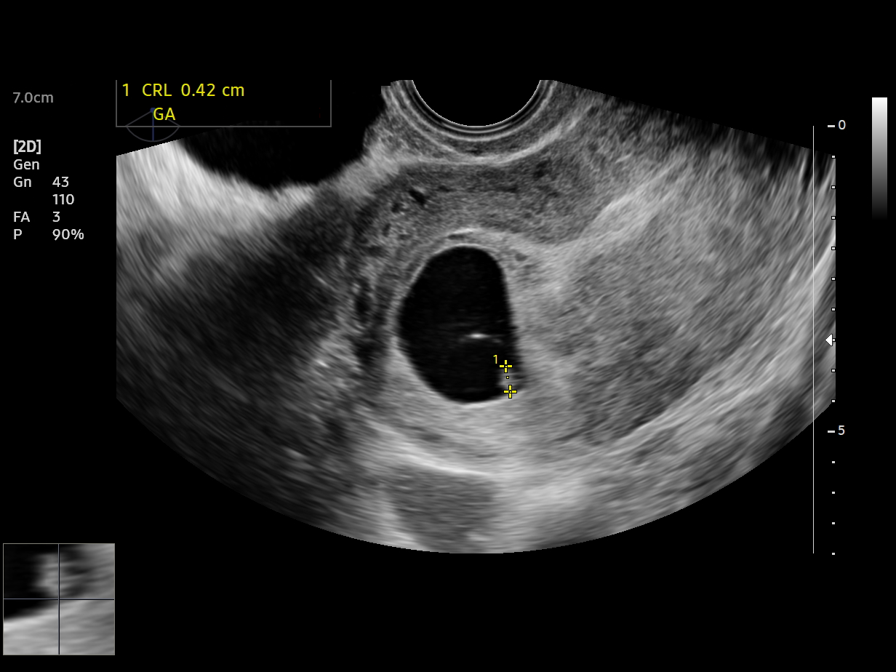
[im 7/7]
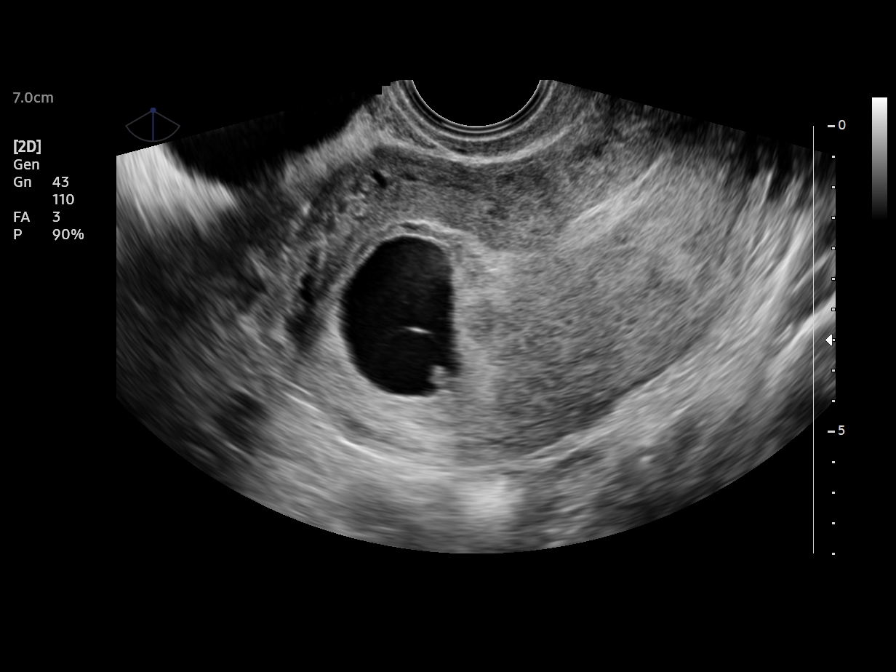

[7 of 7 positions shown; findings below may reference images not displayed]

[facility]
                                                            [facility]
                                                            [place]

 [address]  [HOSPITAL]                         [id number]     [address]

Indications

 Pregnancy with inconclusive fetal viability
 8 weeks gestation of pregnancy
Fetal Evaluation

 Num Of Fetuses:         1
 Cardiac Activity:       Not visualized
Biometry

 CRL:       4.2  mm     G. Age:  6w 1d                   EDD:   11/20/21
OB History

 Gravidity:    6         Term:   3         SAB:   2
 Living:       3
Gestational Age

 Clinical EDD:  8w 1d                                         EDD:   11/06/21
 Best:          8w 1d      Det. By:  Clinical EDD             EDD:   11/06/21
Impression

 Loss of Cardiac activity and no progression of size of
 pregnancy since last u/s c/w missed AB
Recommendations
 Follow-up as clinically indicated.
                  Tiger, Ourari

## 2023-06-11 NOTE — Progress Notes (Unsigned)
Subjective:   Gloria Dean Apr 30, 1986  06/12/2023   CC: Chief Complaint  Patient presents with   New Patient (Initial Visit)    Pt is here to get established with the practice. Is needing to have a physical performed for insurance.    HPI: Gloria Dean is a 37 y.o. female who presents for a routine health maintenance exam and establish care with the practice.  Labs collected at time of visit.   CHRONIC DIARRHEA:  Patient states when pregnant and breastfeeding, she had normal bowel movements. Since weaning from breastfeeding with her 38 year old, she has noticed 5-7 episodes of diarrhea daily. She does not take any probiotics or fiber supplements.States these symptoms began prior to first pregnancy several years ago. Reports mild LLQ pain intermittently.  She denies vomiting, black tarry stools or blood in urine. She does have nausea and intermittent dizziness.    HEALTH SCREENINGS: - Vision Screening: up to date - Dental Visits: up to date - Pap smear: up to date - Breast Exam: up to date - STD Screening: Declined - Mammogram (40+): Not applicable  - Colonoscopy (45+): Not applicable  - Bone Density (65+ or under 65 with predisposing conditions): Not applicable  - Lung CA screening with low-dose CT:  Not applicable Adults age 74-80 who are current cigarette smokers or quit within the last 15 years. Must have 20 pack year history.   Depression and Anxiety Screen done today and results listed below:     06/12/2023   11:19 AM 05/07/2022    8:32 AM 12/05/2021    9:40 AM  Depression screen PHQ 2/9  Decreased Interest 0 0 0  Down, Depressed, Hopeless 0 0 0  PHQ - 2 Score 0 0 0  Altered sleeping 1 1 0  Tired, decreased energy 1 1 0  Change in appetite 1 0 0  Feeling bad or failure about yourself  0 0 0  Trouble concentrating 2 0 0  Moving slowly or fidgety/restless 1 0 0  Suicidal thoughts 0  0  PHQ-9 Score 6 2 0  Difficult doing work/chores Somewhat difficult Somewhat  difficult       06/12/2023   11:20 AM 05/07/2022    8:33 AM 12/05/2021    9:40 AM  GAD 7 : Generalized Anxiety Score  Nervous, Anxious, on Edge 1 1 1   Control/stop worrying 0 0 1  Worry too much - different things 1 1 1   Trouble relaxing 1 0 0  Restless 0 0 0  Easily annoyed or irritable 1 1 0  Afraid - awful might happen 1 0 0  Total GAD 7 Score 5 3 3   Anxiety Difficulty Somewhat difficult Somewhat difficult     IMMUNIZATIONS: - Tdap: Tetanus vaccination status reviewed: last tetanus booster within 10 years. - HPV: Not applicable - Influenza: Postponed to flu season - Pneumovax: Not applicable - Prevnar 20: Not applicable - Zostavax (50+): Not applicable   Past medical history, surgical history, medications, allergies, family history and social history reviewed with patient today and changes made to appropriate areas of the chart.   Past Medical History:  Diagnosis Date   Asthma with allergic rhinitis 08/07/2011   History of 2019 novel coronavirus disease (COVID-19) 10/2020   per pt mild symptoms that resolved   History of abnormal cervical Pap smear 2010   History of asthma    per pt as teen seasonal asthma but no issue since   History of chlamydia  History of genital warts 12/17/2011   Treated with TCA    History of pregnancy induced hypertension    preeclampsia   History of recurrent miscarriages    LGSIL on Pap smear of cervix 09/05/2011   Negative colpo--for repeat with HPV typing this pregnancy-Same pap--will repeat colpo--now wit LGSIL-->colpo--benign biopsy--repeat in 1 year.   Severe dysplasia of cervix (CIN III) 06/07/2021   06/04/21 Colposcopy Pathology after HGSIL pap showed CIN III >> LEEP 07/10/2021 showed CIN III with negative margins >> Pap 01/2022 Negative cytology, negative HPV . Needs annual pap and HPV testing every year for three years>>If all negative, then cotesting every three years for at least 25 years.   Wears contact lenses     Past Surgical  History:  Procedure Laterality Date   DILATION AND EVACUATION N/A 04/04/2021   Procedure: DILATATION AND EVACUATION with [name] testing;  Surgeon: Tereso Newcomer, MD;  Location: [facility];  Service: Gynecology;  Laterality: N/A;  [name] testing   WISDOM TOOTH EXTRACTION     teen    Current Outpatient Medications on File Prior to Visit  Medication Sig   acetaminophen (TYLENOL) 325 MG tablet Take 2 tablets (650 mg total) by mouth every 4 (four) hours as needed (for pain scale < 4).   Calcium Carbonate Antacid (TUMS PO) Take 1 tablet by mouth 2 (two) times daily as needed (heartburn).   cetirizine (ZYRTEC) 10 MG tablet Take 10 mg by mouth daily as needed for allergies.   FOLIC ACID PO Take 1 tablet by mouth daily.   ibuprofen (ADVIL) 600 MG tablet Take 1 tablet (600 mg total) by mouth every 6 (six) hours.   Prenatal Vit-Fe Fumarate-FA (MULTIVITAMIN-PRENATAL) 27-0.8 MG TABS tablet Take 1 tablet by mouth daily at 12 noon.   ferrous gluconate (FERGON) 324 MG tablet TAKE 1 TABLET BY MOUTH DAILY WITH BREAKFAST (Patient not taking: Reported on 06/12/2023)   No current facility-administered medications on file prior to visit.    No Known Allergies   Social History   Socioeconomic History   Marital status: Married    Spouse name: Not on file   Number of children: Not on file   Years of education: Not on file   Highest education level: Not on file  Occupational History   Not on file  Tobacco Use   Smoking status: Former    Current packs/day: 0.00    Types: Cigarettes    Start date: 2002    Quit date: 2012    Years since quitting: 12.6   Smokeless tobacco: Never   Tobacco comments:    socially  Advertising account planner   Vaping status: Never Used  Substance and Sexual Activity   Alcohol use: Not Currently    Comment: occasional   Drug use: Never   Sexual activity: Yes    Partners: Male    Birth control/protection: None  Other Topics Concern   Not on file  Social History  Narrative   Not on file   Social Determinants of Health   Financial Resource Strain: Not on file  Food Insecurity: No Food Insecurity (07/22/2022)   Hunger Vital Sign    Worried About Running Out of Food in the Last Year: Never true    Ran Out of Food in the Last Year: Never true  Transportation Needs: No Transportation Needs (07/22/2022)   PRAPARE - Administrator, Civil Service (Medical): No    Lack of Transportation (Non-Medical): No  Physical Activity: Not  on file  Stress: Not on file  Social Connections: Not on file  Intimate Partner Violence: Not At Risk (07/22/2022)   Humiliation, Afraid, Rape, and Kick questionnaire    Fear of Current or Ex-Partner: No    Emotionally Abused: No    Physically Abused: No    Sexually Abused: No   Social History   Tobacco Use  Smoking Status Former   Current packs/day: 0.00   Types: Cigarettes   Start date: 2002   Quit date: 2012   Years since quitting: 12.6  Smokeless Tobacco Never  Tobacco Comments   socially   Social History   Substance and Sexual Activity  Alcohol Use Not Currently   Comment: occasional    Family History  Problem Relation Age of Onset   Cancer Maternal Grandfather        lung     ROS: Denies fever, fatigue, unexplained weight loss/gain, chest pain, SHOB, and palpitations. Denies neurological deficits, gastrointestinal or genitourinary complaints, and skin changes.   Objective:   Today's Vitals   06/12/23 1112  BP: 116/78  Pulse: 72  SpO2: 100%  Weight: 156 lb (70.8 kg)  Height: 5\' 3"  (1.6 m)    GENERAL APPEARANCE: Well-appearing, in NAD. Well nourished.  SKIN: Pink, warm and dry. Turgor normal. No rash, lesion, ulceration, or ecchymoses. Hair evenly distributed.  HEENT: HEAD: Normocephalic.  EYES: PERRLA. EOMI. Lids intact w/o defect. Sclera white, Conjunctiva pink w/o exudate.  EARS: External ear w/o redness, swelling, masses or lesions. EAC clear. TM's intact, translucent w/o  bulging, appropriate landmarks visualized. Appropriate acuity to conversational tones.  NOSE: Septum midline w/o deformity. Nares patent, mucosa pink and non-inflamed w/o drainage. No sinus tenderness.  THROAT: Uvula midline. Oropharynx clear. Tonsils non-inflamed w/o exudate. Oral mucosa pink and moist.  NECK: Supple, Trachea midline. Full ROM w/o pain or tenderness. No lymphadenopathy. Thyroid non-tender w/o enlargement or palpable masses.  BRESPIRATORY: Chest wall symmetrical w/o masses. Respirations even and non-labored. Breath sounds clear to auscultation bilaterally. No wheezes, rales, rhonchi, or crackles. CARDIAC: S1, S2 present, regular rate and rhythm. No gallops, murmurs, rubs, or clicks. PMI w/o lifts, heaves, or thrills. No carotid bruits. Capillary refill <2 seconds. Peripheral pulses 2+ bilaterally. GI: Abdomen soft w/o distention. Normoactive bowel sounds. No palpable masses or tenderness. No guarding or rebound tenderness. Liver and spleen w/o tenderness or enlargement. No CVA tenderness.  MSK: Muscle tone and strength appropriate for age, w/o atrophy or abnormal movement.  EXTREMITIES: Active ROM intact, w/o tenderness, crepitus, or contracture. No obvious joint deformities or effusions. No clubbing, edema, or cyanosis.  NEUROLOGIC: CN's II-XII intact. Motor strength symmetrical with no obvious weakness. No sensory deficits. DTR's 2+ symmetric bilaterally. Steady, even gait.  PSYCH/MENTAL STATUS: Alert, oriented x 3. Cooperative, appropriate mood and affect.      Assessment & Plan:  1. Annual physical exam 2. Encounter to establish care  Will obtain labs today for AE. She had previous TSH, B12 with visit with OBGYN in May 2024. Form signed for patient's insurance coverage as well.   - CBC with Differential/Platelet - Comprehensive metabolic panel - Lipid panel - Hemoglobin A1c  2. Chronic diarrhea of unknown origin Discussed possible functional diarrhea with patient and  changes in micro biome of gut. Will increase fiber in diet, start fiber and probiotic supplement daily. If no improvement in 2-3 weeks, will reach out to PCP and possibly test stool if needed and refer to GI if needed as well for concern of IBD.  Orders Placed This Encounter  Procedures   CBC with Differential/Platelet   Comprehensive metabolic panel   Lipid panel   Hemoglobin A1c    PATIENT COUNSELING:  - Encouraged a healthy well-balanced diet. Patient may adjust caloric intake to maintain or achieve ideal body weight. May reduce intake of dietary saturated fat and total fat and have adequate dietary potassium and calcium preferably from fresh fruits, vegetables, and low-fat dairy products.   - Advised to avoid cigarette smoking. - Discussed with the patient that most people either abstain from alcohol or drink within safe limits (<=14/week and <=4 drinks/occasion for males, <=7/weeks and <= 3 drinks/occasion for females) and that the risk for alcohol disorders and other health effects rises proportionally with the number of drinks per week and how often a drinker exceeds daily limits. - Discussed cessation/primary prevention of drug use and availability of treatment for abuse.  - Discussed sexually transmitted diseases, avoidance of unintended pregnancy and contraceptive alternatives.  - Stressed the importance of regular exercise - Injury prevention: Discussed safety belts, safety helmets, smoke detector, smoking near bedding or upholstery.  - Dental health: Discussed importance of regular tooth brushing, flossing, and dental visits.   NEXT PREVENTATIVE PHYSICAL DUE IN 1 YEAR.  Return in about 3 weeks (around 07/03/2023) for Follow up diarrhea .  Patient to reach out to office if new, worrisome, or unresolved symptoms arise or if no improvement in patient's condition. Patient verbalized understanding and is agreeable to treatment plan. All questions answered to patient's satisfaction.    Of note, portions of this note may have been created with voice recognition software Physicist, medical). While this note has been edited for accuracy, occasional wrong-word or 'sound-a-like' substitutions may have occurred due to the inherent limitations of voice recognition software.  Yolanda Manges, FNP

## 2023-06-12 ENCOUNTER — Encounter (HOSPITAL_BASED_OUTPATIENT_CLINIC_OR_DEPARTMENT_OTHER): Payer: Self-pay | Admitting: Family Medicine

## 2023-06-12 ENCOUNTER — Ambulatory Visit (HOSPITAL_BASED_OUTPATIENT_CLINIC_OR_DEPARTMENT_OTHER): Payer: 59 | Admitting: Family Medicine

## 2023-06-12 VITALS — BP 116/78 | HR 72 | Ht 63.0 in | Wt 156.0 lb

## 2023-06-12 DIAGNOSIS — Z7689 Persons encountering health services in other specified circumstances: Secondary | ICD-10-CM | POA: Diagnosis not present

## 2023-06-12 DIAGNOSIS — K529 Noninfective gastroenteritis and colitis, unspecified: Secondary | ICD-10-CM

## 2023-06-12 DIAGNOSIS — Z0000 Encounter for general adult medical examination without abnormal findings: Secondary | ICD-10-CM

## 2023-06-12 NOTE — Patient Instructions (Addendum)
Start Fiber Supplement (Gummies or capsule whichever you prefer) and start a Probiotic (should have at least 2 strains of bacteria)   If no improvement in 2-3 weeks, follow up in office.

## 2023-06-13 LAB — CBC WITH DIFFERENTIAL/PLATELET
Basophils Absolute: 0 10*3/uL (ref 0.0–0.2)
Basos: 1 %
EOS (ABSOLUTE): 0.2 10*3/uL (ref 0.0–0.4)
Eos: 2 %
Hematocrit: 37.5 % (ref 34.0–46.6)
Hemoglobin: 12.5 g/dL (ref 11.1–15.9)
Immature Grans (Abs): 0 10*3/uL (ref 0.0–0.1)
Immature Granulocytes: 0 %
Lymphocytes Absolute: 2 10*3/uL (ref 0.7–3.1)
Lymphs: 31 %
MCH: 28.7 pg (ref 26.6–33.0)
MCHC: 33.3 g/dL (ref 31.5–35.7)
MCV: 86 fL (ref 79–97)
Monocytes Absolute: 0.4 10*3/uL (ref 0.1–0.9)
Monocytes: 6 %
Neutrophils Absolute: 3.8 10*3/uL (ref 1.4–7.0)
Neutrophils: 60 %
Platelets: 250 10*3/uL (ref 150–450)
RBC: 4.36 x10E6/uL (ref 3.77–5.28)
RDW: 12.5 % (ref 11.7–15.4)
WBC: 6.4 10*3/uL (ref 3.4–10.8)

## 2023-06-13 LAB — LIPID PANEL
Chol/HDL Ratio: 3 ratio (ref 0.0–4.4)
Cholesterol, Total: 176 mg/dL (ref 100–199)
HDL: 58 mg/dL (ref >39)
LDL Chol Calc (NIH): 108 mg/dL — ABNORMAL HIGH (ref 0–99)
Triglycerides: 48 mg/dL (ref 0–149)
VLDL Cholesterol Cal: 10 mg/dL (ref 5–40)

## 2023-06-13 LAB — COMPREHENSIVE METABOLIC PANEL
ALT: 11 IU/L (ref 0–32)
AST: 12 IU/L (ref 0–40)
Albumin: 4.4 g/dL (ref 3.9–4.9)
Alkaline Phosphatase: 55 IU/L (ref 44–121)
BUN/Creatinine Ratio: 13 (ref 9–23)
BUN: 8 mg/dL (ref 6–20)
Bilirubin Total: 0.3 mg/dL (ref 0.0–1.2)
CO2: 26 mmol/L (ref 20–29)
Calcium: 9.2 mg/dL (ref 8.7–10.2)
Chloride: 103 mmol/L (ref 96–106)
Creatinine, Ser: 0.6 mg/dL (ref 0.57–1.00)
Globulin, Total: 1.8 g/dL (ref 1.5–4.5)
Glucose: 105 mg/dL — ABNORMAL HIGH (ref 70–99)
Potassium: 4.2 mmol/L (ref 3.5–5.2)
Sodium: 141 mmol/L (ref 134–144)
Total Protein: 6.2 g/dL (ref 6.0–8.5)
eGFR: 118 mL/min/{1.73_m2} (ref >59)

## 2023-06-13 LAB — HEMOGLOBIN A1C
Est. average glucose Bld gHb Est-mCnc: 114 mg/dL
Hgb A1c MFr Bld: 5.6 % (ref 4.8–5.6)

## 2023-07-03 ENCOUNTER — Ambulatory Visit (HOSPITAL_BASED_OUTPATIENT_CLINIC_OR_DEPARTMENT_OTHER): Payer: 59 | Admitting: Family Medicine

## 2023-07-03 ENCOUNTER — Encounter (HOSPITAL_BASED_OUTPATIENT_CLINIC_OR_DEPARTMENT_OTHER): Payer: Self-pay | Admitting: Family Medicine

## 2023-07-03 VITALS — BP 116/73 | HR 72 | Ht 63.0 in | Wt 156.0 lb

## 2023-07-03 DIAGNOSIS — K529 Noninfective gastroenteritis and colitis, unspecified: Secondary | ICD-10-CM

## 2023-07-03 MED ORDER — DICYCLOMINE HCL 10 MG PO CAPS: 10.0000 mg | ORAL_CAPSULE | Freq: Three times a day (TID) | ORAL | 3 refills | Status: AC

## 2023-07-03 NOTE — Progress Notes (Signed)
Subjective:   Gloria Dean [birth year] 07/03/2023  Chief Complaint  Patient presents with   Medical Management of Chronic Issues    Patient is still having same problems as before that she wants to discuss to see what needs to be done. States it usually gets bad right before her cycle.    HPI: Gloria Dean presents today for re-assessment and management of chronic medical conditions.   Chronic Diarrhea:  She states diarrhea improves right before her menstrual cycle but states diarrhea returns when her menstrual cycle starts. Having approx. 5-7 episodes per day. She is taking probiotic and fiber supplement without improvement. Denies bloody or black tarry stools. She is not on any birth control currently. She states when she was pregnant she does not have this issue. She did have an IUD in place and states she did get pregnant with IUD in place. IUD was never removed/found per patient but she had complete pregnancy with normal delivery. She denies nausea, vomiting, fever/chills or dysuria.    The following portions of the patient's history were reviewed and updated as appropriate: past medical history, past surgical history, family history, social history, allergies, medications, and problem list.   Patient Active Problem List   Diagnosis Date Noted   Chronic diarrhea of unknown origin 06/12/2023   History of severe cervical dysplasia (CIN III) 06/07/2021   Past Medical History:  Diagnosis Date   Asthma with allergic rhinitis 08/07/2011   History of 2019 novel coronavirus disease (COVID-19) 10/2020   per pt mild symptoms that resolved   History of abnormal cervical Pap smear 2010   History of asthma    per pt as teen seasonal asthma but no issue since   History of chlamydia    History of genital warts 12/17/2011   Treated with TCA    History of pregnancy induced hypertension    preeclampsia   History of recurrent miscarriages    LGSIL on Pap smear of cervix  09/05/2011   Negative colpo--for repeat with HPV typing this pregnancy-Same pap--will repeat colpo--now wit LGSIL-->colpo--benign biopsy--repeat in 1 year.   Severe dysplasia of cervix (CIN III) 06/07/2021   06/04/21 Colposcopy Pathology after HGSIL pap showed CIN III >> LEEP 07/10/2021 showed CIN III with negative margins >> Pap 01/2022 Negative cytology, negative HPV . Needs annual pap and HPV testing every year for three years>>If all negative, then cotesting every three years for at least 25 years.   Wears contact lenses    Past Surgical History:  Procedure Laterality Date   DILATION AND EVACUATION N/A 04/04/2021   Procedure: DILATATION AND EVACUATION with [name] testing;  Surgeon: Tereso Newcomer, MD;  Location: [facility];  Service: Gynecology;  Laterality: N/A;  [name] testing   WISDOM TOOTH EXTRACTION     teen   Family History  Problem Relation Age of Onset   Cancer Maternal Grandfather        lung   Outpatient Medications Prior to Visit  Medication Sig Dispense Refill   acetaminophen (TYLENOL) 325 MG tablet Take 2 tablets (650 mg total) by mouth every 4 (four) hours as needed (for pain scale < 4). 30 tablet 0   Calcium Carbonate Antacid (TUMS PO) Take 1 tablet by mouth 2 (two) times daily as needed (heartburn).     cetirizine (ZYRTEC) 10 MG tablet Take 10 mg by mouth daily as needed for allergies.     ferrous gluconate (FERGON) 324 MG tablet TAKE 1 TABLET BY  MOUTH DAILY WITH BREAKFAST 30 tablet 3   FOLIC ACID PO Take 1 tablet by mouth daily.     ibuprofen (ADVIL) 600 MG tablet Take 1 tablet (600 mg total) by mouth every 6 (six) hours. 30 tablet 0   Prenatal Vit-Fe Fumarate-FA (MULTIVITAMIN-PRENATAL) 27-0.8 MG TABS tablet Take 1 tablet by mouth daily at 12 noon.     No facility-administered medications prior to visit.   No Known Allergies   ROS: A complete ROS was performed with pertinent positives/negatives noted in the HPI. The remainder of the ROS are negative.     Objective:   Today's Vitals   07/03/23 1319  BP: 116/73  Pulse: 72  SpO2: 99%  Weight: 156 lb (70.8 kg)  Height: 5\' 3"  (1.6 m)    Physical Exam          GENERAL: Well-appearing, in NAD. Well nourished.  SKIN: Pink, warm and dry. No rash, lesion, ulceration, or ecchymoses.  Head: Normocephalic. NECK: Trachea midline. Full ROM w/o pain or tenderness.  RESPIRATORY: Chest wall symmetrical. Respirations even and non-labored.  GI: Abdomen soft, non-tender. MSK: Muscle tone and strength appropriate for age. Joints w/o tenderness, redness, or swelling.  EXTREMITIES: Without clubbing, cyanosis, or edema.  NEUROLOGIC: No motor or sensory deficits. Steady, even gait. C2-C12 intact.  PSYCH/MENTAL STATUS: Alert, oriented x 3. Cooperative, appropriate mood and affect.     No results found for any visits on 07/03/23.  The ASCVD Risk score (Arnett DK, et al., 2019) failed to calculate for the following reasons:   The 2019 ASCVD risk score is only valid for ages 48 to 19     Assessment & Plan:  1. Chronic diarrhea of unknown origin Discussed possibility of trial of birth control to see if by lightening menstruation this would improve peristalsis and diarrhea. Pt would like to have further workup with GI to determine if menstrual related and use bentyl during menstruation. She will let PCP know if she would like to pursue birth control. Pt is currently breastfeeding, PCP instructed patient not to take Bentyl if breastfeeding. Pt states she is weaning currently and will not take medication until completely stopped with breastfeeding.  - Ambulatory referral to Gastroenterology    Meds ordered this encounter  Medications   dicyclomine (BENTYL) 10 MG capsule    Sig: Take 1 capsule (10 mg total) by mouth 4 (four) times daily -  before meals and at bedtime.    Dispense:  90 capsule    Refill:  3    Order Specific Question:   Supervising Provider    Answer:   DE [place], RAYMOND J [id number]    Lab Orders  No laboratory test(s) ordered today    Return if symptoms worsen or fail to improve.    Patient to reach out to office if new, worrisome, or unresolved symptoms arise or if no improvement in patient's condition. Patient verbalized understanding and is agreeable to treatment plan. All questions answered to patient's satisfaction.   Of note, portions of this note may have been created with voice recognition software Physicist, medical). While this note has been edited for accuracy, occasional wrong-word or 'sound-a-like' substitutions may have occurred due to the inherent limitations of voice recognition software.  Yolanda Manges, FNP

## 2023-07-11 ENCOUNTER — Encounter: Payer: Self-pay | Admitting: Internal Medicine

## 2023-11-11 ENCOUNTER — Other Ambulatory Visit (INDEPENDENT_AMBULATORY_CARE_PROVIDER_SITE_OTHER): Payer: 59

## 2023-11-11 ENCOUNTER — Ambulatory Visit: Payer: 59 | Admitting: Internal Medicine

## 2023-11-11 ENCOUNTER — Encounter: Payer: Self-pay | Admitting: Internal Medicine

## 2023-11-11 VITALS — BP 118/78 | HR 75 | Ht 63.0 in | Wt 156.8 lb

## 2023-11-11 DIAGNOSIS — R110 Nausea: Secondary | ICD-10-CM

## 2023-11-11 DIAGNOSIS — M25431 Effusion, right wrist: Secondary | ICD-10-CM

## 2023-11-11 DIAGNOSIS — M2540 Effusion, unspecified joint: Secondary | ICD-10-CM

## 2023-11-11 DIAGNOSIS — R140 Abdominal distension (gaseous): Secondary | ICD-10-CM

## 2023-11-11 DIAGNOSIS — K529 Noninfective gastroenteritis and colitis, unspecified: Secondary | ICD-10-CM

## 2023-11-11 DIAGNOSIS — R6881 Early satiety: Secondary | ICD-10-CM | POA: Diagnosis not present

## 2023-11-11 DIAGNOSIS — M7989 Other specified soft tissue disorders: Secondary | ICD-10-CM

## 2023-11-11 DIAGNOSIS — Z8379 Family history of other diseases of the digestive system: Secondary | ICD-10-CM

## 2023-11-11 DIAGNOSIS — M25432 Effusion, left wrist: Secondary | ICD-10-CM

## 2023-11-11 LAB — HIGH SENSITIVITY CRP: CRP, High Sensitivity: 0.87 mg/L (ref 0.000–5.000)

## 2023-11-11 MED ORDER — SUFLAVE 178.7 G PO SOLR: 1.0000 | Freq: Once | ORAL | 0 refills | Status: AC

## 2023-11-11 NOTE — Progress Notes (Signed)
 Patient ID: Gloria Dean, female   DOB: 1986-09-07, 38 y.o.   MRN: [id number] HPI: Discussed the use of AI scribe software for clinical note transcription with the patient, who gave verbal consent to proceed.  TOSHUA Dean is a 38 year old female who presents with chronic diarrhea.  She also has a history of cervical dysplasia.  She is here alone today.  She has experienced chronic diarrhea since before her third pregnancy, which began approximately a year before December 2020. Symptoms improve during pregnancy but worsen in between. She takes Imodium daily, usually one tablet, but increases to two if symptoms persist. Without Imodium, she experiences diarrhea up to ten times a day, characterized by urgency and occasional incontinence. The diarrhea does not occur at night and is not associated with bleeding or mucus, except when hemorrhoids are present due to physical strain from carrying her child.  She experiences lower abdominal cramping, described as 'real low, like down below,' which sometimes improves after bowel movements but often persists. The cramping is exacerbated after eating. She has not taken Bentyl  due to breastfeeding concerns.  She reports episodes of nausea, dizziness, and food aversion, with a history of being evaluated for silent migraines. She experiences bloating and gets full quickly. She has not adhered to a specific diet but notes that dairy can increase nausea. No significant history of environmental allergies, eczema, or asthma, except for weather-dependent asthma during high school.  Her family history includes a maternal grandfather who had colon cancer and a maternal grandmother with celiac disease. She has been tested for celiac disease, which was negative, and other tests including thyroid, B12, and liver function have been normal. She recalls a possible family history of Crohn's disease but is uncertain.  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      Past Medical History:  Diagnosis Date   Anemia    Anxiety    Asthma with allergic rhinitis 08/07/2011   History of 2019 novel coronavirus disease (COVID-19) 10/2020   per pt mild symptoms that resolved   History of abnormal cervical Pap smear 2010   History of asthma    per pt as teen seasonal asthma but no issue since   History of chlamydia    History of genital warts 12/17/2011   Treated with TCA    History of pregnancy induced hypertension    preeclampsia   History of recurrent miscarriages    LGSIL on Pap smear of cervix 09/05/2011   Negative colpo--for repeat with HPV typing this pregnancy-Same pap--will repeat colpo--now wit LGSIL-->colpo--benign biopsy--repeat in 1 year.   Severe dysplasia of cervix (CIN III) 06/07/2021   06/04/21 Colposcopy Pathology after HGSIL pap showed CIN III >> LEEP 07/10/2021 showed CIN III with negative margins >> Pap 01/2022 Negative cytology, negative HPV . Needs annual pap and HPV testing every year for three years>>If all negative, then cotesting every three years for at least 25 years.   Wears contact lenses     Past Surgical History:  Procedure Laterality Date   DILATION AND EVACUATION N/A 04/04/2021   Procedure: DILATATION AND EVACUATION with [name] testing;  Surgeon: Gloria Dean LABOR, MD;  Location: [facility];  Service: Gynecology;  Laterality: N/A;  [name] testing   WISDOM TOOTH EXTRACTION     teen    Outpatient Medications Prior to Visit  Medication Sig Dispense Refill   acetaminophen  (TYLENOL ) 325 MG tablet  Take 2 tablets (650 mg total) by mouth every 4 (four) hours as needed (for pain scale < 4). (Patient not taking: Reported on 11/11/2023) 30 tablet 0   Calcium Carbonate Antacid (TUMS PO) Take 1 tablet by mouth 2 (two) times daily as needed (heartburn). (Patient not taking: Reported on 11/11/2023)     cetirizine (ZYRTEC) 10 MG tablet Take 10 mg by mouth daily as needed for  allergies. (Patient not taking: Reported on 11/11/2023)     dicyclomine  (BENTYL ) 10 MG capsule Take 1 capsule (10 mg total) by mouth 4 (four) times daily -  before meals and at bedtime. (Patient not taking: Reported on 11/11/2023) 90 capsule 3   ferrous gluconate  (FERGON) 324 MG tablet TAKE 1 TABLET BY MOUTH DAILY WITH BREAKFAST (Patient not taking: Reported on 11/11/2023) 30 tablet 3   FOLIC ACID PO Take 1 tablet by mouth daily. (Patient not taking: Reported on 11/11/2023)     ibuprofen  (ADVIL ) 600 MG tablet Take 1 tablet (600 mg total) by mouth every 6 (six) hours. (Patient not taking: Reported on 11/11/2023) 30 tablet 0   Prenatal Vit-Fe Fumarate-FA (MULTIVITAMIN-PRENATAL) 27-0.8 MG TABS tablet Take 1 tablet by mouth daily at 12 noon. (Patient not taking: Reported on 11/11/2023)     No facility-administered medications prior to visit.    No Known Allergies  Family History  Problem Relation Age of Onset   Irritable bowel syndrome Mother    Celiac disease Maternal Grandmother    Diabetes Maternal Grandmother    Cancer Maternal Grandfather        lung   Crohn's disease Maternal Grandfather    Liver disease Neg Hx    Esophageal cancer Neg Hx     Social History   Tobacco Use   Smoking status: Former    Current packs/day: 0.00    Types: Cigarettes    Start date: 2002    Quit date: 2012    Years since quitting: 13.1   Smokeless tobacco: Never   Tobacco comments:    socially  Advertising Account Planner   Vaping status: Never Used  Substance Use Topics   Alcohol use: Not Currently    Comment: occasional   Drug use: Never    ROS: As per history of present illness, otherwise negative  BP 118/78   Pulse 75   Ht 5' 3 (1.6 m)   Wt 156 lb 12.8 oz (71.1 kg)   BMI 27.78 kg/m  Gen: awake, alert, NAD HEENT: anicteric  CV: RRR, no mrg Pulm: CTA b/l Abd: soft, NT/ND, +BS throughout Ext: no c/c/e Neuro: nonfocal   RELEVANT LABS AND IMAGING: CBC    Component Value Date/Time   WBC 6.4 06/12/2023  1204   WBC 16.0 (H) 07/22/2022 2156   RBC 4.36 06/12/2023 1204   RBC 3.83 (L) 07/22/2022 2156   HGB 12.5 06/12/2023 1204   HCT 37.5 06/12/2023 1204   PLT 250 06/12/2023 1204   MCV 86 06/12/2023 1204   MCH 28.7 06/12/2023 1204   MCH 31.3 07/22/2022 2156   MCHC 33.3 06/12/2023 1204   MCHC 36.4 (H) 07/22/2022 2156   RDW 12.5 06/12/2023 1204   LYMPHSABS 2.0 06/12/2023 1204   MONOABS 0.6 07/22/2011 0933   EOSABS 0.2 06/12/2023 1204   BASOSABS 0.0 06/12/2023 1204    CMP     Component Value Date/Time   NA 141 06/12/2023 1204   K 4.2 06/12/2023 1204   CL 103 06/12/2023 1204   CO2 26 06/12/2023 1204   GLUCOSE  105 (H) 06/12/2023 1204   GLUCOSE 98 07/22/2022 2156   BUN 8 06/12/2023 1204   CREATININE 0.60 06/12/2023 1204   CREATININE 0.85 02/16/2014 0951   CALCIUM 9.2 06/12/2023 1204   PROT 6.2 06/12/2023 1204   ALBUMIN 4.4 06/12/2023 1204   AST 12 06/12/2023 1204   ALT 11 06/12/2023 1204   ALKPHOS 55 06/12/2023 1204   BILITOT 0.3 06/12/2023 1204   GFRNONAA >60 07/22/2022 2156   GFRAA 123 06/03/2017 1011    Results   LABS  Thyroid studies: Normal Vitamin B12: Normal Complete blood count: Normal Liver function tests: Normal   ASSESSMENT/PLAN:  Chronic Diarrhea Chronic diarrhea with urgency, cramping, and bloating. Symptoms improve during pregnancy and with Imodium use. No nocturnal symptoms. Family history of celiac disease and colon cancer.  Rule out inflammatory diarrhea. -Order celiac panel and CRP. -Order fecal calprotectin and elastase. -Schedule colonoscopy for 6-8 weeks out, pending results of other tests.  Nausea and Early Satiety Reports feeling nauseous and getting full quickly. -Consider further evaluation depending on results of initial workup.  Hand and Wrist Swelling Reports bilateral hand and wrist swelling. -Consider further evaluation depending on results of initial workup.       Rr:Rjlioz, Thersia Bitters, Fnp [address] [place],  [place] [address]

## 2023-11-11 NOTE — Patient Instructions (Signed)
Your provider has requested that you go to the basement level for lab work before leaving today. Press "B" on the elevator. The lab is located at the first door on the left as you exit the elevator.  You have been scheduled for a colonoscopy. Please follow written instructions given to you at your visit today.   If you use inhalers (even only as needed), please bring them with you on the day of your procedure.  DO NOT TAKE 7 DAYS PRIOR TO TEST- Trulicity (dulaglutide) Ozempic, Wegovy (semaglutide) Mounjaro (tirzepatide) Bydureon Bcise (exanatide extended release)  DO NOT TAKE 1 DAY PRIOR TO YOUR TEST Rybelsus (semaglutide) Adlyxin (lixisenatide) Victoza (liraglutide) Byetta (exanatide) ___________________________________________________________________________  Bonita Quin will receive your bowel preparation through [facility], which ensures the lowest copay and home delivery, with outreach via text or call from an [phone] number. Please respond promptly to avoid rescheduling of your procedure. If you are interested in alternative options or have any questions regarding your prep, please contact them at [phone] ____________________________________________________________________________  Your Provider Has Sent Your Bowel Prep Regimen To [facility]   [facility] will contact you to verify your information and collect your copay, if applicable. Enjoy the comfort of your home while your prescription is mailed to you, FREE of any shipping charges.   [facility] accepts all major insurance benefits and applies discounts & coupons.  Have additional questions?   Chat: [email or web address] Call: [phone] Email: [email or web address] NCPDP: [id number]  How will [facility] contact you?  With a Welcome phone call,  a Welcome text and a checkout link in text form.  Texts you receive from [phone] Are NOT Spam.  *To set up delivery, you must complete the checkout process via link  or speak to one of the patient care representatives. If [facility] is unable to reach you, your prescription may be delayed.  To avoid long hold times on the phone, you may also utilize the secure chat feature on the [facility] website to request that they call you back for transaction completion or to expedite your concerns.

## 2023-11-13 ENCOUNTER — Encounter (HOSPITAL_BASED_OUTPATIENT_CLINIC_OR_DEPARTMENT_OTHER): Payer: Self-pay | Admitting: Family Medicine

## 2023-11-13 ENCOUNTER — Ambulatory Visit (HOSPITAL_BASED_OUTPATIENT_CLINIC_OR_DEPARTMENT_OTHER): Payer: 59 | Admitting: Family Medicine

## 2023-11-13 VITALS — BP 129/84 | HR 81 | Ht 63.0 in | Wt 155.0 lb

## 2023-11-13 DIAGNOSIS — M25432 Effusion, left wrist: Secondary | ICD-10-CM

## 2023-11-13 DIAGNOSIS — M25431 Effusion, right wrist: Secondary | ICD-10-CM | POA: Diagnosis not present

## 2023-11-13 DIAGNOSIS — R002 Palpitations: Secondary | ICD-10-CM | POA: Insufficient documentation

## 2023-11-13 LAB — TISSUE TRANSGLUTAMINASE, IGA: (tTG) Ab, IgA: <1 U/mL

## 2023-11-13 LAB — IGA: Immunoglobulin A: 92 mg/dL (ref 47–310)

## 2023-11-13 NOTE — Progress Notes (Signed)
 Subjective:   Gloria Dean 1986-10-02 11/13/2023  Chief Complaint  Patient presents with   Shortness of Breath    Patient has been having problems with SOB that she states comes on at random times and also was asked by a nurse at GI if her heart beat skipped at times.    HPI: Gloria Dean 38 year old female with medical history of chronic diarrhea presenting today for acute concern of palpitations  PALPITATIONS Duration: months Symptom description: reports having sensation of pressure on her chest with deep breathing. She went to GI MD two days ago and was asked if her heart skipped a beat based upon her BP. Did not have workup.  Duration of episode:  Will last for a few seconds to a few hours Frequency:  At least once a day per patient  Activity when event occurred:  Occurs when active and at rest Related to exertion: no Dyspnea: yes Chest pain:  Yes, left chest pain Syncope: no Anxiety/stress: no Nausea/vomiting: no Diaphoresis: no Coronary artery disease: no Congestive heart failure: no Arrhythmia:no Thyroid disease:  TSH WNL in Sept 2024 Caffeine intake:  2 cups of coffee in the morning over 4 hours, Coca cola for lunch or dinner  Status:  fluctuating  ARTHRALGIAS / JOINT ACHES Patient reports new onset for past several weeks of bilateral wrist swelling.  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  She denies numbness, tingling, rash, recent injuries or accidents.  She states swelling occurs throughout the day and has noticed swelling due to inability of being able to wear a wrist watch. Decreased function/range of motion: no  Erythema: no Heat or warmth: no Morning stiffness: no Aggravating factors: None Alleviating factors: None Treatments tried:  rest    The following portions of the patient's history were reviewed and updated as appropriate: past medical history, past surgical  history, family history, social history, allergies, medications, and problem list.   Patient Active Problem List   Diagnosis Date Noted   Swelling of both wrists 11/13/2023   Palpitations 11/13/2023   Chronic diarrhea of unknown origin 06/12/2023   History of severe cervical dysplasia (CIN III) 06/07/2021   Past Medical History:  Diagnosis Date   Anemia    Anxiety    Asthma with allergic rhinitis 08/07/2011   History of 2019 novel coronavirus disease (COVID-19) 10/2020   per pt mild symptoms that resolved   History of abnormal cervical Pap smear 2010   History of asthma    per pt as teen seasonal asthma but no issue since   History of chlamydia    History of genital warts 12/17/2011   Treated with TCA    History of pregnancy induced hypertension    preeclampsia   History of recurrent miscarriages    LGSIL on Pap smear of cervix 09/05/2011   Negative colpo--for repeat with HPV typing this pregnancy-Same pap--will repeat colpo--now wit LGSIL-->colpo--benign biopsy--repeat in 1 year.   Severe dysplasia of cervix (CIN III) 06/07/2021   06/04/21 Colposcopy Pathology after HGSIL pap showed CIN III >> LEEP 07/10/2021 showed CIN III with negative margins >> Pap 01/2022 Negative cytology, negative HPV . Needs annual pap and HPV testing every year for three years>>If all negative, then cotesting every three years for at least 25 years.   Wears contact lenses    Past Surgical History:  Procedure Laterality Date   DILATION AND EVACUATION N/A 04/04/2021  Procedure: DILATATION AND EVACUATION with [name] testing;  Surgeon: Herchel Gloris LABOR, MD;  Location: [facility];  Service: Gynecology;  Laterality: N/A;  [name] testing   WISDOM TOOTH EXTRACTION     teen   Family History  Problem Relation Age of Onset   Irritable bowel syndrome Mother    Celiac disease Maternal Grandmother    Diabetes Maternal Grandmother    Cancer Maternal Grandfather        lung   Crohn's disease  Maternal Grandfather    Liver disease Neg Hx    Esophageal cancer Neg Hx    Outpatient Medications Prior to Visit  Medication Sig Dispense Refill   FOLIC ACID PO Take 1 tablet by mouth daily.     ibuprofen  (ADVIL ) 600 MG tablet Take 1 tablet (600 mg total) by mouth every 6 (six) hours. 30 tablet 0   Prenatal Vit-Fe Fumarate-FA (MULTIVITAMIN-PRENATAL) 27-0.8 MG TABS tablet Take 1 tablet by mouth daily at 12 noon.     acetaminophen  (TYLENOL ) 325 MG tablet Take 2 tablets (650 mg total) by mouth every 4 (four) hours as needed (for pain scale < 4). (Patient not taking: Reported on 11/13/2023) 30 tablet 0   Calcium Carbonate Antacid (TUMS PO) Take 1 tablet by mouth 2 (two) times daily as needed (heartburn). (Patient not taking: Reported on 11/13/2023)     cetirizine (ZYRTEC) 10 MG tablet Take 10 mg by mouth daily as needed for allergies. (Patient not taking: Reported on 11/13/2023)     dicyclomine  (BENTYL ) 10 MG capsule Take 1 capsule (10 mg total) by mouth 4 (four) times daily -  before meals and at bedtime. (Patient not taking: Reported on 11/13/2023) 90 capsule 3   ferrous gluconate  (FERGON) 324 MG tablet TAKE 1 TABLET BY MOUTH DAILY WITH BREAKFAST (Patient not taking: Reported on 11/11/2023) 30 tablet 3   No facility-administered medications prior to visit.   No Known Allergies   ROS: A complete ROS was performed with pertinent positives/negatives noted in the HPI. The remainder of the ROS are negative.    Objective:   Today's Vitals   11/13/23 1532  BP: 129/84  Pulse: 81  SpO2: 98%  Weight: 155 lb (70.3 kg)  Height: 5' 3 (1.6 m)    Physical Exam          GENERAL: Well-appearing, in NAD. Well nourished.  SKIN: Pink, warm and dry. No rash, lesion, ulceration, or ecchymoses.  Head: Normocephalic. NECK: Trachea midline. Full ROM w/o pain or tenderness.  RESPIRATORY: Chest wall symmetrical. Respirations even and non-labored. Breath sounds clear to auscultation bilaterally.  CARDIAC: S1, S2  present, regular rate and rhythm without murmur or gallops. Peripheral pulses 2+ bilaterally. Carotid arteries without bruit or thrill.  MSK: Muscle tone and strength appropriate for age. Joints w/o tenderness, redness, or swelling.  EXTREMITIES: Without clubbing, cyanosis, or edema.  NEUROLOGIC: No motor or sensory deficits. Steady, even gait. C2-C12 intact.  PSYCH/MENTAL STATUS: Alert, oriented x 3. Cooperative, appropriate mood and affect.   EKG: normal EKG, normal sinus rhythm, there are no previous tracings available for comparison.    Assessment & Plan:  1. Palpitations (Primary) Exam and EKG are reassuring today in the office.  Discussed possible causes of palpitations including iron , vitamin deficiencies, thyroid disorders.  Patient does have a history of iron  deficiency anemia.  We will check her iron , B12, TSH and magnesium  with lab work today.  Pending lab results, may recommend a Zio patch or Holter monitor for evaluation  of palpitations.  Patient agreeable - Iron , TIBC and Ferritin Panel - Vitamin B12 - TSH Rfx on Abnormal to Free T4 - Magnesium   2. Swelling of both wrists Possible overuse of wrist joints with occupation.  Recommend wrist bracing as needed, ice or heat, NSAID use as needed and rest.  Monitor salt intake and increase clear fluids.  If worsening reach out to PCP   Lab Orders         Iron , TIBC and Ferritin Panel         Vitamin B12         TSH Rfx on Abnormal to Free T4         Magnesium       Return in about 3 months (around 02/10/2024) for Follow up Iron  deficiency, palpitations .    Patient to reach out to office if new, worrisome, or unresolved symptoms arise or if no improvement in patient's condition. Patient verbalized understanding and is agreeable to treatment plan. All questions answered to patient's satisfaction.    Thersia Schuyler Stark, [place]

## 2023-11-14 ENCOUNTER — Encounter (HOSPITAL_BASED_OUTPATIENT_CLINIC_OR_DEPARTMENT_OTHER): Payer: Self-pay | Admitting: Family Medicine

## 2023-11-14 ENCOUNTER — Other Ambulatory Visit: Payer: 59

## 2023-11-14 ENCOUNTER — Encounter: Payer: Self-pay | Admitting: Internal Medicine

## 2023-11-14 DIAGNOSIS — K529 Noninfective gastroenteritis and colitis, unspecified: Secondary | ICD-10-CM

## 2023-11-14 LAB — IRON,TIBC AND FERRITIN PANEL
Ferritin: 73 ng/mL (ref 15–150)
Iron Saturation: 11 % — ABNORMAL LOW (ref 15–55)
Iron: 35 ug/dL (ref 27–159)
Total Iron Binding Capacity: 306 ug/dL (ref 250–450)
UIBC: 271 ug/dL (ref 131–425)

## 2023-11-14 LAB — VITAMIN B12: Vitamin B-12: 422 pg/mL (ref 232–1245)

## 2023-11-14 LAB — TSH RFX ON ABNORMAL TO FREE T4: TSH: 2.64 u[IU]/mL (ref 0.450–4.500)

## 2023-11-14 LAB — MAGNESIUM: Magnesium: 2 mg/dL (ref 1.6–2.3)

## 2023-11-14 NOTE — Progress Notes (Signed)
 Please schedule for follow up appt in 3 months for Iron  Def. Anemia and Palpitations   Hi Gloria Dean, Your labs did show that your iron  saturation is slightly low.  Your iron  concentration has decreased from 1 year ago.  This may be contributing to your palpitations and fatigue.  I would recommend starting a iron  supplement such as Slow Fe which is over-the-counter maybe 2 times a week and then we could recheck this in approximately 3 months to make sure that the iron  is being absorbed.  If your symptoms are continuing after starting the iron  supplement, please let me know and we would proceed with the cardiac monitoring patch.  I am going to forward this message to my CMA so that we can get you scheduled for an appointment in 3 months.

## 2023-11-17 ENCOUNTER — Ambulatory Visit (HOSPITAL_BASED_OUTPATIENT_CLINIC_OR_DEPARTMENT_OTHER): Payer: 59 | Admitting: Family Medicine

## 2023-11-19 ENCOUNTER — Encounter: Payer: Self-pay | Admitting: Internal Medicine

## 2023-11-19 LAB — CALPROTECTIN, FECAL: Calprotectin, Fecal: 11 ug/g (ref 0–120)

## 2023-11-20 ENCOUNTER — Ambulatory Visit (HOSPITAL_BASED_OUTPATIENT_CLINIC_OR_DEPARTMENT_OTHER): Payer: 59 | Admitting: Family Medicine

## 2023-11-20 LAB — PANCREATIC ELASTASE, FECAL: Pancreatic Elastase-1, Stool: >800 ug/g (ref >200)

## 2023-12-22 ENCOUNTER — Encounter: Payer: Self-pay | Admitting: Internal Medicine

## 2023-12-29 ENCOUNTER — Ambulatory Visit (AMBULATORY_SURGERY_CENTER): Payer: 59 | Admitting: Internal Medicine

## 2023-12-29 ENCOUNTER — Encounter: Payer: Self-pay | Admitting: Internal Medicine

## 2023-12-29 VITALS — BP 110/72 | HR 80 | Temp 98.4°F | Resp 17 | Ht 63.0 in | Wt 156.0 lb

## 2023-12-29 DIAGNOSIS — K529 Noninfective gastroenteritis and colitis, unspecified: Secondary | ICD-10-CM

## 2023-12-29 DIAGNOSIS — K644 Residual hemorrhoidal skin tags: Secondary | ICD-10-CM | POA: Diagnosis not present

## 2023-12-29 DIAGNOSIS — D122 Benign neoplasm of ascending colon: Secondary | ICD-10-CM | POA: Diagnosis present

## 2023-12-29 DIAGNOSIS — K648 Other hemorrhoids: Secondary | ICD-10-CM | POA: Diagnosis not present

## 2023-12-29 DIAGNOSIS — Z3202 Encounter for pregnancy test, result negative: Secondary | ICD-10-CM | POA: Diagnosis not present

## 2023-12-29 DIAGNOSIS — K635 Polyp of colon: Secondary | ICD-10-CM | POA: Diagnosis not present

## 2023-12-29 DIAGNOSIS — D123 Benign neoplasm of transverse colon: Secondary | ICD-10-CM

## 2023-12-29 LAB — POCT URINE PREGNANCY: Preg Test, Ur: NEGATIVE

## 2023-12-29 MED ORDER — SODIUM CHLORIDE 0.9 % IV SOLN
500.0000 mL | Freq: Once | INTRAVENOUS | Status: AC
Start: 2023-12-29 — End: ?

## 2023-12-29 NOTE — Op Note (Signed)
 [facility] Patient Name: Gloria Dean Procedure Date: 12/29/2023 1:29 PM MRN: [id number] Endoscopist: Beverley Fiedler , MD, [id number] Age: 38 Referring MD:  Date of Birth: [birth year] Gender: Female Account #: [id number] Procedure:                Colonoscopy Indications:              Chronic diarrhea Medicines:                Monitored Anesthesia Care Procedure:                Pre-Anesthesia Assessment:                           - Prior to the procedure, a History and Physical                            was performed, and patient medications and                            allergies were reviewed. The patient's tolerance of                            previous anesthesia was also reviewed. The risks                            and benefits of the procedure and the sedation                            options and risks were discussed with the patient.                            All questions were answered, and informed consent                            was obtained. Prior Anticoagulants: The patient has                            taken no anticoagulant or antiplatelet agents. ASA                            Grade Assessment: II - A patient with mild systemic                            disease. After reviewing the risks and benefits,                            the patient was deemed in satisfactory condition to                            undergo the procedure.                           After obtaining informed consent, the colonoscope  was passed under direct vision. Throughout the                            procedure, the patient's blood pressure, pulse, and                            oxygen saturations were monitored continuously. The                            Olympus Scope [id number] was introduced through the                            anus and advanced to the cecum, identified by                            palpation. The colonoscopy was performed  without                            difficulty. The patient tolerated the procedure                            well. The quality of the bowel preparation was                            excellent. The terminal ileum, ileocecal valve,                            appendiceal orifice, and rectum were photographed. Scope In: 1:41:57 PM Scope Out: 2:00:04 PM Scope Withdrawal Time: 0 hours 10 minutes 11 seconds  Total Procedure Duration: 0 hours 18 minutes 7 seconds  Findings:                 The digital rectal exam was normal.                           The terminal ileum appeared normal.                           A 10 mm polyp was found in the ascending colon. The                            polyp was sessile. The polyp was removed with a                            cold snare. Resection and retrieval were complete.                           A 6 mm polyp was found in the transverse colon. The                            polyp was sessile. The polyp was removed with a  cold snare. Resection and retrieval were complete.                           External and internal hemorrhoids were found during                            retroflexion. The hemorrhoids were small.                           The exam was otherwise without abnormality.                           Biopsies for histology were taken with a cold                            forceps from the right colon and left colon for                            evaluation of microscopic colitis. Complications:            No immediate complications. Estimated Blood Loss:     Estimated blood loss was minimal. Impression:               - The examined portion of the ileum was normal.                           - One 10 mm polyp in the ascending colon, removed                            with a cold snare. Resected and retrieved.                           - One 6 mm polyp in the transverse colon, removed                            with a  cold snare. Resected and retrieved.                           - External and internal hemorrhoids.                           - The examination was otherwise normal.                           - Biopsies were taken with a cold forceps from the                            right colon and left colon for evaluation of                            microscopic colitis. Recommendation:           - Patient has a contact number available for  emergencies. The signs and symptoms of potential                            delayed complications were discussed with the                            patient. Return to normal activities tomorrow.                            Written discharge instructions were provided to the                            patient.                           - Resume previous diet.                           - Continue present medications.                           - Await pathology results. If negative for                            microscopic colitis consider Viberzi.                           - Repeat colonoscopy is recommended for                            surveillance. The colonoscopy date will be                            determined after pathology results from today's                            exam become available for review. Beverley Fiedler, MD 12/29/2023 2:07:16 PM This report has been signed electronically.

## 2023-12-29 NOTE — Patient Instructions (Signed)

## 2023-12-29 NOTE — Progress Notes (Signed)
 GASTROENTEROLOGY PROCEDURE H&P NOTE   Primary Care Physician: Hilbert Bible, FNP    Reason for Procedure:  Chronic diarrhea  Plan:    Colonoscopy  Patient is appropriate for endoscopic procedure(s) in the ambulatory ([facility]) setting.  The nature of the procedure, as well as the risks, benefits, and alternatives were carefully and thoroughly reviewed with the patient. Ample time for discussion and questions allowed. The patient understood, was satisfied, and agreed to proceed.     HPI: Gloria Dean is a 38 y.o. female who presents for colonoscopy.  Medical history as below.  Tolerated the prep.  No recent chest pain or shortness of breath.  No abdominal pain today.  Past Medical History:  Diagnosis Date   Anemia    Anxiety    Asthma with allergic rhinitis 08/07/2011   History of 2019 novel coronavirus disease (COVID-19) 10/2020   per pt mild symptoms that resolved   History of abnormal cervical Pap smear 2010   History of asthma    per pt as teen seasonal asthma but no issue since   History of chlamydia    History of genital warts 12/17/2011   Treated with TCA    History of pregnancy induced hypertension    preeclampsia   History of recurrent miscarriages    Iron deficiency    LGSIL on Pap smear of cervix 09/05/2011   Negative colpo--for repeat with HPV typing this pregnancy-Same pap--will repeat colpo--now wit LGSIL-->colpo--benign biopsy--repeat in 1 year.   Severe dysplasia of cervix (CIN III) 06/07/2021   06/04/21 Colposcopy Pathology after HGSIL pap showed CIN III >> LEEP 07/10/2021 showed CIN III with negative margins >> Pap 01/2022 Negative cytology, negative HPV . Needs annual pap and HPV testing every year for three years>>If all negative, then cotesting every three years for at least 25 years.   Vitamin D deficiency    Wears contact lenses     Past Surgical History:  Procedure Laterality Date   COLONOSCOPY     DILATION AND EVACUATION N/A 04/04/2021    Procedure: DILATATION AND EVACUATION with [name] testing;  Surgeon: Tereso Newcomer, MD;  Location: [facility];  Service: Gynecology;  Laterality: N/A;  [name] testing   WISDOM TOOTH EXTRACTION     teen    Prior to Admission medications   Medication Sig Start Date End Date Taking? Authorizing Provider  acetaminophen (TYLENOL) 325 MG tablet Take 2 tablets (650 mg total) by mouth every 4 (four) hours as needed (for pain scale < 4). Patient not taking: Reported on 11/13/2023 07/24/22   Autry-Lott, Randa Evens, DO  Calcium Carbonate Antacid (TUMS PO) Take 1 tablet by mouth 2 (two) times daily as needed (heartburn). Patient not taking: Reported on 11/13/2023    [provider]  cetirizine (ZYRTEC) 10 MG tablet Take 10 mg by mouth daily as needed for allergies. Patient not taking: Reported on 11/13/2023    [provider]  dicyclomine (BENTYL) 10 MG capsule Take 1 capsule (10 mg total) by mouth 4 (four) times daily -  before meals and at bedtime. Patient not taking: Reported on 12/29/2023 07/03/23   Hilbert Bible, FNP  ferrous sulfate 325 (65 FE) MG EC tablet Take 325 mg by mouth daily with breakfast.    [provider]  FOLIC ACID PO Take 1 tablet by mouth daily.    [provider]  ibuprofen (ADVIL) 600 MG tablet Take 1 tablet (600 mg total) by mouth every 6 (six) hours. 07/24/22  Autry-Lott, Simone, DO  magnesium oxide (MAG-OX) 400 MG tablet Take 400 mg by mouth daily.    [provider]  Prenatal Vit-Fe Fumarate-FA (MULTIVITAMIN-PRENATAL) 27-0.8 MG TABS tablet Take 1 tablet by mouth daily at 12 noon.    [provider]    Current Outpatient Medications  Medication Sig Dispense Refill   acetaminophen (TYLENOL) 325 MG tablet Take 2 tablets (650 mg total) by mouth every 4 (four) hours as needed (for pain scale < 4). (Patient not taking: Reported on 11/13/2023) 30 tablet 0   Calcium Carbonate Antacid (TUMS PO) Take 1 tablet by  mouth 2 (two) times daily as needed (heartburn). (Patient not taking: Reported on 11/13/2023)     cetirizine (ZYRTEC) 10 MG tablet Take 10 mg by mouth daily as needed for allergies. (Patient not taking: Reported on 11/13/2023)     dicyclomine (BENTYL) 10 MG capsule Take 1 capsule (10 mg total) by mouth 4 (four) times daily -  before meals and at bedtime. (Patient not taking: Reported on 12/29/2023) 90 capsule 3   ferrous sulfate 325 (65 FE) MG EC tablet Take 325 mg by mouth daily with breakfast.     FOLIC ACID PO Take 1 tablet by mouth daily.     ibuprofen (ADVIL) 600 MG tablet Take 1 tablet (600 mg total) by mouth every 6 (six) hours. 30 tablet 0   magnesium oxide (MAG-OX) 400 MG tablet Take 400 mg by mouth daily.     Prenatal Vit-Fe Fumarate-FA (MULTIVITAMIN-PRENATAL) 27-0.8 MG TABS tablet Take 1 tablet by mouth daily at 12 noon.     Current Facility-Administered Medications  Medication Dose Route Frequency Provider Last Rate Last Admin   0.9 %  sodium chloride infusion  500 mL Intravenous Once Hatice Bubel, Carie Caddy, MD        Allergies as of 12/29/2023   (No Known Allergies)    Family History  Problem Relation Age of Onset   Irritable bowel syndrome Mother    Celiac disease Maternal Grandmother    Diabetes Maternal Grandmother    Colon cancer Maternal Grandfather    Cancer Maternal Grandfather        lung   Crohn's disease Maternal Grandfather    Liver disease Neg Hx    Esophageal cancer Neg Hx    Rectal cancer Neg Hx    Stomach cancer Neg Hx     Social History   Socioeconomic History   Marital status: Married    Spouse name: Not on file   Number of children: 4   Years of education: Not on file   Highest education level: Not on file  Occupational History   Not on file  Tobacco Use   Smoking status: Former    Current packs/day: 0.00    Types: Cigarettes    Start date: 2002    Quit date: 2012    Years since quitting: 13.2   Smokeless tobacco: Never   Tobacco comments:     socially  Advertising account planner   Vaping status: Never Used  Substance and Sexual Activity   Alcohol use: Not Currently    Comment: occasional   Drug use: Never   Sexual activity: Yes    Partners: Male    Birth control/protection: None  Other Topics Concern   Not on file  Social History Narrative   Not on file   Social Drivers of Health   Financial Resource Strain: Low Risk  (11/13/2023)   Overall Financial Resource Strain (CARDIA)    Difficulty  of Paying Living Expenses: Not hard at all  Food Insecurity: No Food Insecurity (07/22/2022)   Hunger Vital Sign    Worried About Running Out of Food in the Last Year: Never true    Ran Out of Food in the Last Year: Never true  Transportation Needs: No Transportation Needs (07/22/2022)   PRAPARE - Administrator, Civil Service (Medical): No    Lack of Transportation (Non-Medical): No  Physical Activity: Insufficiently Active (11/13/2023)   Exercise Vital Sign    Days of Exercise per Week: 4 days    Minutes of Exercise per Session: 30 min  Stress: No Stress Concern Present (11/13/2023)   [employer] of Occupational Health - Occupational Stress Questionnaire    Feeling of Stress : Not at all  Social Connections: Moderately Isolated (11/13/2023)   Social Connection and Isolation Panel [NHANES]    Frequency of Communication with Friends and Family: More than three times a week    Frequency of Social Gatherings with Friends and Family: More than three times a week    Attends Religious Services: Never    Database administrator or Organizations: No    Attends Banker Meetings: Never    Marital Status: Married  Catering manager Violence: Not At Risk (07/22/2022)   Humiliation, Afraid, Rape, and Kick questionnaire    Fear of Current or Ex-Partner: No    Emotionally Abused: No    Physically Abused: No    Sexually Abused: No    Physical Exam: Vital signs in last 24 hours: @BP  126/76   Pulse 76   Temp 98.4 F (36.9 C)  (Temporal)   Ht 5\' 3"  (1.6 m)   Wt 156 lb (70.8 kg)   LMP 12/01/2023 (Exact Date)   SpO2 99%   BMI 27.63 kg/m  GEN: NAD EYE: Sclerae anicteric ENT: MMM CV: Non-tachycardic Pulm: CTA b/l GI: Soft, NT/ND NEURO:  Alert & Oriented x 3   Erick Blinks, MD [facility]  12/29/2023 1:28 PM

## 2023-12-29 NOTE — Progress Notes (Signed)
 Called to room to assist during endoscopic procedure.  Patient ID and intended procedure confirmed with present staff. Received instructions for my participation in the procedure from the performing physician.

## 2023-12-29 NOTE — Progress Notes (Signed)
 Pt's states no medical or surgical changes since previsit or office visit.

## 2023-12-29 NOTE — Progress Notes (Signed)
 Report to PACU, RN, vss, BBS= Clear.

## 2023-12-30 ENCOUNTER — Telehealth: Payer: Self-pay

## 2023-12-30 NOTE — Telephone Encounter (Signed)
  Follow up Call-     12/29/2023   12:56 PM  Call back number  Post procedure Call Back phone  # [phone]  Permission to leave phone message Yes     Patient questions:  Do you have a fever, pain , or abdominal swelling? No. Pain Score  0 *  Have you tolerated food without any problems? Yes.    Have you been able to return to your normal activities? Yes.    Do you have any questions about your discharge instructions: Diet   No. Medications  No. Follow up visit  No.  Do you have questions or concerns about your Care? No.  Actions: * If pain score is 4 or above: No action needed, pain <4.

## 2024-01-01 LAB — SURGICAL PATHOLOGY

## 2024-01-02 ENCOUNTER — Other Ambulatory Visit: Payer: Self-pay | Admitting: *Deleted

## 2024-01-02 MED ORDER — VIBERZI 75 MG PO TABS: 1.0000 | ORAL_TABLET | Freq: Two times a day (BID) | ORAL | 2 refills | Status: AC

## 2024-02-18 ENCOUNTER — Ambulatory Visit (INDEPENDENT_AMBULATORY_CARE_PROVIDER_SITE_OTHER): Payer: 59 | Admitting: Family Medicine

## 2024-02-18 ENCOUNTER — Encounter (HOSPITAL_BASED_OUTPATIENT_CLINIC_OR_DEPARTMENT_OTHER): Payer: Self-pay | Admitting: Family Medicine

## 2024-02-18 VITALS — BP 113/81 | HR 64 | Ht 63.0 in | Wt 154.7 lb

## 2024-02-18 DIAGNOSIS — E559 Vitamin D deficiency, unspecified: Secondary | ICD-10-CM | POA: Diagnosis not present

## 2024-02-18 DIAGNOSIS — E611 Iron deficiency: Secondary | ICD-10-CM | POA: Diagnosis not present

## 2024-02-18 NOTE — Progress Notes (Signed)
 Subjective:   Gloria Dean 03-19-1986 02/18/2024  Chief Complaint  Patient presents with   Medical Management of Chronic Issues    110-month follow up; denies any main concerns since last visit.    HPI: Gloria Dean presents today for re-assessment and management of chronic medical conditions.  IDA:  Gloria Dean presents for the medical management of Anemia.  Patient states since treatment of iron  deficiency she has resolution of palpitations.  She is taking iron  supplement as directed.  She is followed by Dr. Bridgett Camps with gastroenterology for chronic diarrhea and recommended to start Viberzi  once she is finished breast-feeding. Current medication : Slow FE 1 tablet every day   Well controlled: Yes  Denies bloody stools, hematuria, excessive fatigue, palpitations, pica.  Patient is not seeing hematology for management.    Lab Results  Component Value Date   WBC 6.4 06/12/2023   HGB 12.5 06/12/2023   HCT 37.5 06/12/2023   MCV 86 06/12/2023   PLT 250 06/12/2023    Lab Results  Component Value Date   IRON  35 11/13/2023   TIBC 306 11/13/2023   FERRITIN 73 11/13/2023    VITAMIN D DEFICIENCY: Gloria Dean presents for the medical management of Vitamin D deficiency.  Current regimen:  Vitamin D3 (5000 units) twice daily and K2 supplement  Complaint with regimen: Yes Up to date DEXA: N/a  Denies recent falls or injury.  Last vitamin D No results found for: "25OHVITD2", "25OHVITD3", "VD25OH"   The following portions of the patient's history were reviewed and updated as appropriate: past medical history, past surgical history, family history, social history, allergies, medications, and problem list.   Patient Active Problem List   Diagnosis Date Noted   Iron  deficiency 02/18/2024   Vitamin D deficiency 02/18/2024   Swelling of both wrists 11/13/2023   Palpitations 11/13/2023   Chronic diarrhea of unknown origin 06/12/2023   History of severe  cervical dysplasia (CIN III) 06/07/2021   Past Medical History:  Diagnosis Date   Anemia    Anxiety    Asthma with allergic rhinitis 08/07/2011   History of 2019 novel coronavirus disease (COVID-19) 10/2020   per pt mild symptoms that resolved   History of abnormal cervical Pap smear 2010   History of asthma    per pt as teen seasonal asthma but no issue since   History of chlamydia    History of genital warts 12/17/2011   Treated with TCA    History of pregnancy induced hypertension    preeclampsia   History of recurrent miscarriages    Iron  deficiency    LGSIL on Pap smear of cervix 09/05/2011   Negative colpo--for repeat with HPV typing this pregnancy-Same pap--will repeat colpo--now wit LGSIL-->colpo--benign biopsy--repeat in 1 year.   Severe dysplasia of cervix (CIN III) 06/07/2021   06/04/21 Colposcopy Pathology after HGSIL pap showed CIN III >> LEEP 07/10/2021 showed CIN III with negative margins >> Pap 01/2022 Negative cytology, negative HPV . Needs annual pap and HPV testing every year for three years>>If all negative, then cotesting every three years for at least 25 years.   Vitamin D deficiency    Wears contact lenses    Past Surgical History:  Procedure Laterality Date   COLONOSCOPY     DILATION AND EVACUATION N/A 04/04/2021   Procedure: DILATATION AND EVACUATION with [name] testing;  Surgeon: Gloria Octave, MD;  Location: [facility];  Service: Gynecology;  Laterality: N/A;  [name]  testing   WISDOM TOOTH EXTRACTION     teen   Family History  Problem Relation Age of Onset   Irritable bowel syndrome Mother    Celiac disease Maternal Grandmother    Diabetes Maternal Grandmother    Colon cancer Maternal Grandfather    Cancer Maternal Grandfather        lung   Crohn's disease Maternal Grandfather    Liver disease Neg Hx    Esophageal cancer Neg Hx    Rectal cancer Neg Hx    Stomach cancer Neg Hx    Outpatient Medications Prior to Visit   Medication Sig Dispense Refill   acetaminophen  (TYLENOL ) 325 MG tablet Take 2 tablets (650 mg total) by mouth every 4 (four) hours as needed (for pain scale < 4). 30 tablet 0   Calcium Carbonate Antacid (TUMS PO) Take 1 tablet by mouth 2 (two) times daily as needed (heartburn).     cetirizine (ZYRTEC) 10 MG tablet Take 10 mg by mouth daily as needed for allergies.     dicyclomine  (BENTYL ) 10 MG capsule Take 1 capsule (10 mg total) by mouth 4 (four) times daily -  before meals and at bedtime. 90 capsule 3   Eluxadoline  (VIBERZI ) 75 MG TABS Take 1 tablet (75 mg total) by mouth in the morning and at bedtime. 60 tablet 2   ferrous sulfate 325 (65 FE) MG EC tablet Take 325 mg by mouth daily with breakfast.     FOLIC ACID PO Take 1 tablet by mouth daily.     ibuprofen  (ADVIL ) 600 MG tablet Take 1 tablet (600 mg total) by mouth every 6 (six) hours. 30 tablet 0   magnesium  oxide (MAG-OX) 400 MG tablet Take 400 mg by mouth daily.     Prenatal Vit-Fe Fumarate-FA (MULTIVITAMIN-PRENATAL) 27-0.8 MG TABS tablet Take 1 tablet by mouth daily at 12 noon.     Facility-Administered Medications Prior to Visit  Medication Dose Route Frequency Provider Last Rate Last Admin   0.9 %  sodium chloride  infusion  500 mL Intravenous Once Pyrtle, Amber Bail, MD       No Known Allergies   ROS: A complete ROS was performed with pertinent positives/negatives noted in the HPI. The remainder of the ROS are negative.    Objective:   Today's Vitals   02/18/24 1116  BP: 113/81  Pulse: 64  SpO2: 100%  Weight: 154 lb 11.2 oz (70.2 kg)  Height: 5\' 3"  (1.6 m)    Physical Exam          GENERAL: Well-appearing, in NAD. Well nourished.  SKIN: Pink, warm and dry. Head: Normocephalic. NECK: Trachea midline. Full ROM w/o pain or tenderness.  RESPIRATORY: Chest wall symmetrical. Respirations even and non-labored. Breath sounds clear to auscultation bilaterally.  CARDIAC: S1, S2 present, regular rate and rhythm without murmur  or gallops. Peripheral pulses 2+ bilaterally.  MSK: Muscle tone and strength appropriate for age.  NEUROLOGIC: No motor or sensory deficits. Steady, even gait. C2-C12 intact.  PSYCH/MENTAL STATUS: Alert, oriented x 3. Cooperative, appropriate mood and affect.   Assessment & Plan:  1. Vitamin D deficiency (Primary) Will assess vitamin D level with lab work today and provide supplementation pending results. - VITAMIN D 25 Hydroxy (Vit-D Deficiency, Fractures)  2. Iron  deficiency Stable.  Will continue iron  supplementation and assess CBC and iron  panel today with lab work. - CBC with Differential/Platelet - Iron , TIBC and Ferritin Panel  Lab Orders  CBC with Differential/Platelet         Iron , TIBC and Ferritin Panel         VITAMIN D 25 Hydroxy (Vit-D Deficiency, Fractures)     No images are attached to the encounter or orders placed in the encounter.  Return in about 4 months (around 06/20/2024) for ANNUAL PHYSICAL (fasting labs at physical) .    Patient to reach out to office if new, worrisome, or unresolved symptoms arise or if no improvement in patient's condition. Patient verbalized understanding and is agreeable to treatment plan. All questions answered to patient's satisfaction.    Nonda Bays, [place]

## 2024-02-19 ENCOUNTER — Ambulatory Visit (HOSPITAL_BASED_OUTPATIENT_CLINIC_OR_DEPARTMENT_OTHER): Payer: Self-pay | Admitting: Family Medicine

## 2024-02-19 LAB — IRON,TIBC AND FERRITIN PANEL
Ferritin: 92 ng/mL (ref 15–150)
Iron Saturation: 29 % (ref 15–55)
Iron: 91 ug/dL (ref 27–159)
Total Iron Binding Capacity: 319 ug/dL (ref 250–450)
UIBC: 228 ug/dL (ref 131–425)

## 2024-02-19 LAB — CBC WITH DIFFERENTIAL/PLATELET
Basophils Absolute: 0.1 10*3/uL (ref 0.0–0.2)
Basos: 1 %
EOS (ABSOLUTE): 0.2 10*3/uL (ref 0.0–0.4)
Eos: 3 %
Hematocrit: 42 % (ref 34.0–46.6)
Hemoglobin: 13.8 g/dL (ref 11.1–15.9)
Immature Grans (Abs): 0 10*3/uL (ref 0.0–0.1)
Immature Granulocytes: 0 %
Lymphocytes Absolute: 2.4 10*3/uL (ref 0.7–3.1)
Lymphs: 32 %
MCH: 29.7 pg (ref 26.6–33.0)
MCHC: 32.9 g/dL (ref 31.5–35.7)
MCV: 90 fL (ref 79–97)
Monocytes Absolute: 0.5 10*3/uL (ref 0.1–0.9)
Monocytes: 6 %
Neutrophils Absolute: 4.2 10*3/uL (ref 1.4–7.0)
Neutrophils: 58 %
Platelets: 258 10*3/uL (ref 150–450)
RBC: 4.65 x10E6/uL (ref 3.77–5.28)
RDW: 13 % (ref 11.7–15.4)
WBC: 7.4 10*3/uL (ref 3.4–10.8)

## 2024-02-19 LAB — VITAMIN D 25 HYDROXY (VIT D DEFICIENCY, FRACTURES): Vit D, 25-Hydroxy: 38.9 ng/mL (ref 30.0–100.0)

## 2024-02-19 NOTE — Progress Notes (Signed)
 Hi Kelise, Your blood counts have returned to baseline with anemia corrected. Your iron  panel is excellent. Your vitamin D is normal. For your iron  supplement, we could decrease this to taking 1 tablet every other day. If you have further questions, please reach out.

## 2024-07-09 ENCOUNTER — Encounter (HOSPITAL_BASED_OUTPATIENT_CLINIC_OR_DEPARTMENT_OTHER): Payer: Self-pay | Admitting: Family Medicine

## 2024-07-09 ENCOUNTER — Ambulatory Visit (INDEPENDENT_AMBULATORY_CARE_PROVIDER_SITE_OTHER): Admitting: Family Medicine

## 2024-07-09 VITALS — BP 120/82 | HR 64 | Ht 63.0 in | Wt 154.0 lb

## 2024-07-09 DIAGNOSIS — Z0000 Encounter for general adult medical examination without abnormal findings: Secondary | ICD-10-CM

## 2024-07-09 DIAGNOSIS — E611 Iron deficiency: Secondary | ICD-10-CM | POA: Diagnosis not present

## 2024-07-09 DIAGNOSIS — Z13220 Encounter for screening for lipoid disorders: Secondary | ICD-10-CM | POA: Diagnosis not present

## 2024-07-09 NOTE — Patient Instructions (Signed)

## 2024-07-09 NOTE — Progress Notes (Signed)
 Subjective:   Gloria Dean [birth year]  07/09/2024   CC: Chief Complaint  Patient presents with   Annual Exam    Patient is here today for her physical. Denies any concerns for today's visit.    HPI: Gloria Dean is a 38 y.o. female who presents for a routine health maintenance exam.  Labs collected at time of visit. She is currently breastfeeding. Denies current concerns.    HEALTH SCREENINGS: - Vision Screening: up to date - Dental Visits: up to date - Pap smear: up to date - Breast Exam: Declined - STD Screening: Declined - Mammogram (40+): Not applicable  - Colonoscopy (45+): Up to date  - Bone Density (65+ or under 65 with predisposing conditions): Not applicable  - Lung CA screening with low-dose CT:  Not applicable Adults age 5-80 who are current cigarette smokers or quit within the last 15 years. Must have 20 pack year history.   Depression and Anxiety Screen done today and results listed below:     07/09/2024    8:38 AM 02/18/2024   11:18 AM 11/13/2023    3:38 PM 07/03/2023    1:21 PM 06/12/2023   11:19 AM  Depression screen PHQ 2/9  Decreased Interest 0 0 0 0 0  Down, Depressed, Hopeless 0 0 0 0 0  PHQ - 2 Score 0 0 0 0 0  Altered sleeping 0 0 0 1 1  Tired, decreased energy 0 0 0 1 1  Change in appetite 0 0 0 0 1  Feeling bad or failure about yourself  0 0 0 0 0  Trouble concentrating 0 0 0 0 2  Moving slowly or fidgety/restless 0 0 0 0 1  Suicidal thoughts 0 0 0 0 0  PHQ-9 Score 0 0 0 2 6  Difficult doing work/chores Not difficult at all Not difficult at all Not difficult at all Somewhat difficult Somewhat difficult      07/09/2024    8:39 AM 02/18/2024   11:19 AM 11/13/2023    3:39 PM 06/12/2023   11:20 AM  GAD 7 : Generalized Anxiety Score  Nervous, Anxious, on Edge 0 0 0 1  Control/stop worrying 0 0 0 0  Worry too much - different things 0 0 0 1  Trouble relaxing 0 0 0 1  Restless 0 0 0 0  Easily annoyed or irritable 0 0 0 1  Afraid - awful  might happen 0 0 0 1  Total GAD 7 Score 0 0 0 5  Anxiety Difficulty Not difficult at all Not difficult at all Not difficult at all Somewhat difficult    IMMUNIZATIONS: - Tdap: Tetanus vaccination status reviewed: last tetanus booster within 10 years. - HPV: Declined - Influenza: Declined - Pneumovax: Not applicable - Prevnar 20: Not applicable - Shingrix (50+): Not applicable   Past medical history, surgical history, medications, allergies, family history and social history reviewed with patient today and changes made to appropriate areas of the chart.   Past Medical History:  Diagnosis Date   Anemia    Anxiety    Asthma with allergic rhinitis 08/07/2011   History of 2019 novel coronavirus disease (COVID-19) 10/2020   per pt mild symptoms that resolved   History of abnormal cervical Pap smear 2010   History of asthma    per pt as teen seasonal asthma but no issue since   History of chlamydia    History of genital warts 12/17/2011   Treated with TCA  History of pregnancy induced hypertension    preeclampsia   History of recurrent miscarriages    Iron  deficiency    LGSIL on Pap smear of cervix 09/05/2011   Negative colpo--for repeat with HPV typing this pregnancy-Same pap--will repeat colpo--now wit LGSIL-->colpo--benign biopsy--repeat in 1 year.   Severe dysplasia of cervix (CIN III) 06/07/2021   06/04/21 Colposcopy Pathology after HGSIL pap showed CIN III >> LEEP 07/10/2021 showed CIN III with negative margins >> Pap 01/2022 Negative cytology, negative HPV . Needs annual pap and HPV testing every year for three years>>If all negative, then cotesting every three years for at least 25 years.   Vitamin D  deficiency    Wears contact lenses     Past Surgical History:  Procedure Laterality Date   COLONOSCOPY     DILATION AND EVACUATION N/A 04/04/2021   Procedure: DILATATION AND EVACUATION with [name] testing;  Surgeon: Herchel Gloris LABOR, MD;  Location: [facility];   Service: Gynecology;  Laterality: N/A;  [name] testing   WISDOM TOOTH EXTRACTION     teen    Current Outpatient Medications on File Prior to Visit  Medication Sig   acetaminophen  (TYLENOL ) 325 MG tablet Take 2 tablets (650 mg total) by mouth every 4 (four) hours as needed (for pain scale < 4).   Calcium Carbonate Antacid (TUMS PO) Take 1 tablet by mouth 2 (two) times daily as needed (heartburn).   cetirizine (ZYRTEC) 10 MG tablet Take 10 mg by mouth daily as needed for allergies.   dicyclomine  (BENTYL ) 10 MG capsule Take 1 capsule (10 mg total) by mouth 4 (four) times daily -  before meals and at bedtime.   Eluxadoline  (VIBERZI ) 75 MG TABS Take 1 tablet (75 mg total) by mouth in the morning and at bedtime.   ferrous sulfate 325 (65 FE) MG EC tablet Take 325 mg by mouth daily with breakfast.   ibuprofen  (ADVIL ) 600 MG tablet Take 1 tablet (600 mg total) by mouth every 6 (six) hours.   magnesium  oxide (MAG-OX) 400 MG tablet Take 400 mg by mouth daily.   Current Facility-Administered Medications on File Prior to Visit  Medication   0.9 %  sodium chloride  infusion    No Known Allergies   Social History   Socioeconomic History   Marital status: Married    Spouse name: Not on file   Number of children: 4   Years of education: Not on file   Highest education level: Not on file  Occupational History   Not on file  Tobacco Use   Smoking status: Former    Current packs/day: 0.00    Types: Cigarettes    Start date: 2002    Quit date: 2012    Years since quitting: 13.7   Smokeless tobacco: Never   Tobacco comments:    socially  Advertising account planner   Vaping status: Never Used  Substance and Sexual Activity   Alcohol use: Not Currently    Comment: occasional   Drug use: Never   Sexual activity: Yes    Partners: Male    Birth control/protection: None  Other Topics Concern   Not on file  Social History Narrative   Not on file   Social Drivers of Health   Financial Resource Strain:  Low Risk  (04/21/2024)   Received from [facility]   Overall Financial Resource Strain (CARDIA)    Difficulty of Paying Living Expenses: Not hard at all  Food Insecurity: No Food Insecurity (04/21/2024)  Received from [facility]   Hunger Vital Sign    Within the past 12 months, you worried that your food would run out before you got the money to buy more.: Never true    Within the past 12 months, the food you bought just didn't last and you didn't have money to get more.: Never true  Transportation Needs: No Transportation Needs (04/21/2024)   Received from [facility] - Transportation    In the past 12 months, has lack of transportation kept you from medical appointments or from getting medications?: No    Lack of Transportation (Non-Medical): No  Physical Activity: Insufficiently Active (11/13/2023)   Exercise Vital Sign    Days of Exercise per Week: 4 days    Minutes of Exercise per Session: 30 min  Stress: No Stress Concern Present (11/13/2023)   [employer] of Occupational Health - Occupational Stress Questionnaire    Feeling of Stress : Not at all  Social Connections: Moderately Isolated (11/13/2023)   Social Connection and Isolation Panel    Frequency of Communication with Friends and Family: More than three times a week    Frequency of Social Gatherings with Friends and Family: More than three times a week    Attends Religious Services: Never    Database administrator or Organizations: No    Attends Banker Meetings: Never    Marital Status: Married  Catering manager Violence: Not At Risk (07/22/2022)   Humiliation, Afraid, Rape, and Kick questionnaire    Fear of Current or Ex-Partner: No    Emotionally Abused: No    Physically Abused: No    Sexually Abused: No   Social History   Tobacco Use  Smoking Status Former   Current packs/day: 0.00   Types: Cigarettes   Start date: 2002   Quit date:  2012   Years since quitting: 13.7  Smokeless Tobacco Never  Tobacco Comments   socially   Social History   Substance and Sexual Activity  Alcohol Use Not Currently   Comment: occasional    Family History  Problem Relation Age of Onset   Irritable bowel syndrome Mother    Celiac disease Maternal Grandmother    Diabetes Maternal Grandmother    Colon cancer Maternal Grandfather    Cancer Maternal Grandfather        lung   Crohn's disease Maternal Grandfather    Liver disease Neg Hx    Esophageal cancer Neg Hx    Rectal cancer Neg Hx    Stomach cancer Neg Hx      ROS: Denies fever, fatigue, unexplained weight loss/gain, chest pain, SHOB, and palpitations. Denies neurological deficits, gastrointestinal or genitourinary complaints, and skin changes.   Objective:   Today's Vitals   07/09/24 0835  BP: 120/82  Pulse: 64  SpO2: 100%  Weight: 154 lb (69.9 kg)  Height: 5' 3 (1.6 m)    GENERAL APPEARANCE: Well-appearing, in NAD. Well nourished.  SKIN: Pink, warm and dry. Turgor normal. No rash, lesion, ulceration, or ecchymoses. Hair evenly distributed.  HEENT: HEAD: Normocephalic.  EYES: PERRLA. EOMI. Lids intact w/o defect. Sclera white, Conjunctiva pink w/o exudate.  EARS: External ear w/o redness, swelling, masses or lesions. EAC clear. TM's intact, translucent w/o bulging, appropriate landmarks visualized. Appropriate acuity to conversational tones.  NOSE: Septum midline w/o deformity. Nares patent, mucosa pink and non-inflamed w/o drainage. No sinus tenderness.  THROAT: Uvula midline. Oropharynx clear. Tonsils non-inflamed w/o exudate.  Oral mucosa pink and moist.  NECK: Supple, Trachea midline. Full ROM w/o pain or tenderness. No lymphadenopathy. Thyroid non-tender w/o enlargement or palpable masses.  RESPIRATORY: Chest wall symmetrical w/o masses. Respirations even and non-labored. Breath sounds clear to auscultation bilaterally. No wheezes, rales, rhonchi, or  crackles. CARDIAC: S1, S2 present, regular rate and rhythm. No gallops, murmurs, rubs, or clicks. PMI w/o lifts, heaves, or thrills. No carotid bruits. Capillary refill <2 seconds. Peripheral pulses 2+ bilaterally. GI: Abdomen soft w/o distention. Normoactive bowel sounds. No palpable masses or tenderness. No guarding or rebound tenderness. Liver and spleen w/o tenderness or enlargement. No CVA tenderness.  MSK: Muscle tone and strength appropriate for age, w/o atrophy or abnormal movement.  EXTREMITIES: Active ROM intact, w/o tenderness, crepitus, or contracture. No obvious joint deformities or effusions. No clubbing, edema, or cyanosis.  NEUROLOGIC: CN's II-XII intact. Motor strength symmetrical with no obvious weakness. No sensory deficits. DTR's 2+ symmetric bilaterally. Steady, even gait.  PSYCH/MENTAL STATUS: Alert, oriented x 3. Cooperative, appropriate mood and affect.     Assessment & Plan:  1. Annual physical exam (Primary) Discussed preventative screenings, vaccines, and healthy lifestyle with patient. Collected labs as part of annual exam.  - CBC with Differential/Platelet - Comprehensive metabolic panel with GFR - Lipid panel - TSH  2. Screening for lipid disorders Lipid panel collected as part of annual exam.   3. Iron  deficiency Controlled. Will continue iron  supplement and check CBC for stability.    Orders Placed This Encounter  Procedures   CBC with Differential/Platelet   Comprehensive metabolic panel with GFR   Lipid panel   TSH    PATIENT COUNSELING:  - Encouraged a healthy well-balanced diet. Patient may adjust caloric intake to maintain or achieve ideal body weight. May reduce intake of dietary saturated fat and total fat and have adequate dietary potassium and calcium preferably from fresh fruits, vegetables, and low-fat dairy products.   - Advised to avoid cigarette smoking. - Discussed with the patient that most people either abstain from alcohol or drink  within safe limits (<=14/week and <=4 drinks/occasion for males, <=7/weeks and <= 3 drinks/occasion for females) and that the risk for alcohol disorders and other health effects rises proportionally with the number of drinks per week and how often a drinker exceeds daily limits. - Discussed cessation/primary prevention of drug use and availability of treatment for abuse.  - Discussed sexually transmitted diseases, avoidance of unintended pregnancy and contraceptive alternatives.  - Stressed the importance of regular exercise - Injury prevention: Discussed safety belts, safety helmets, smoke detector, smoking near bedding or upholstery.  - Dental health: Discussed importance of regular tooth brushing, flossing, and dental visits.   NEXT PREVENTATIVE PHYSICAL DUE IN 1 YEAR.  Return in about 1 year (around 07/09/2025) for ANNUAL PHYSICAL.  Patient to reach out to office if new, worrisome, or unresolved symptoms arise or if no improvement in patient's condition. Patient verbalized understanding and is agreeable to treatment plan. All questions answered to patient's satisfaction.    Thersia Schuyler Stark, [place]

## 2024-07-10 LAB — COMPREHENSIVE METABOLIC PANEL WITH GFR
ALT: 27 IU/L (ref 0–32)
AST: 18 IU/L (ref 0–40)
Albumin: 4.5 g/dL (ref 3.9–4.9)
Alkaline Phosphatase: 117 IU/L — ABNORMAL HIGH (ref 41–116)
BUN/Creatinine Ratio: 12 (ref 9–23)
BUN: 9 mg/dL (ref 6–20)
Bilirubin Total: <0.2 mg/dL (ref 0.0–1.2)
CO2: 21 mmol/L (ref 20–29)
Calcium: 9.4 mg/dL (ref 8.7–10.2)
Chloride: 102 mmol/L (ref 96–106)
Creatinine, Ser: 0.75 mg/dL (ref 0.57–1.00)
Globulin, Total: 2.3 g/dL (ref 1.5–4.5)
Glucose: 82 mg/dL (ref 70–99)
Potassium: 4.3 mmol/L (ref 3.5–5.2)
Sodium: 141 mmol/L (ref 134–144)
Total Protein: 6.8 g/dL (ref 6.0–8.5)
eGFR: 104 mL/min/1.73 (ref >59)

## 2024-07-10 LAB — CBC WITH DIFFERENTIAL/PLATELET
Basophils Absolute: 0.1 x10E3/uL (ref 0.0–0.2)
Basos: 1 %
EOS (ABSOLUTE): 0.3 x10E3/uL (ref 0.0–0.4)
Eos: 3 %
Hematocrit: 39.9 % (ref 34.0–46.6)
Hemoglobin: 12.9 g/dL (ref 11.1–15.9)
Immature Grans (Abs): 0.1 x10E3/uL (ref 0.0–0.1)
Immature Granulocytes: 1 %
Lymphocytes Absolute: 2.3 x10E3/uL (ref 0.7–3.1)
Lymphs: 23 %
MCH: 29.5 pg (ref 26.6–33.0)
MCHC: 32.3 g/dL (ref 31.5–35.7)
MCV: 91 fL (ref 79–97)
Monocytes Absolute: 0.6 x10E3/uL (ref 0.1–0.9)
Monocytes: 6 %
Neutrophils Absolute: 6.8 x10E3/uL (ref 1.4–7.0)
Neutrophils: 66 %
Platelets: 303 x10E3/uL (ref 150–450)
RBC: 4.38 x10E6/uL (ref 3.77–5.28)
RDW: 12.2 % (ref 11.7–15.4)
WBC: 10.1 x10E3/uL (ref 3.4–10.8)

## 2024-07-10 LAB — LIPID PANEL
Chol/HDL Ratio: 4 ratio (ref 0.0–4.4)
Cholesterol, Total: 195 mg/dL (ref 100–199)
HDL: 49 mg/dL (ref >39)
LDL Chol Calc (NIH): 128 mg/dL — ABNORMAL HIGH (ref 0–99)
Triglycerides: 99 mg/dL (ref 0–149)
VLDL Cholesterol Cal: 18 mg/dL (ref 5–40)

## 2024-07-10 LAB — TSH: TSH: 1.15 u[IU]/mL (ref 0.450–4.500)

## 2024-07-11 ENCOUNTER — Ambulatory Visit (HOSPITAL_BASED_OUTPATIENT_CLINIC_OR_DEPARTMENT_OTHER): Payer: Self-pay | Admitting: Family Medicine

## 2024-07-11 NOTE — Progress Notes (Signed)
 Hi Qiara,  Your electrolytes and kidney function are stable. Your alkaline phosphatase is slightly elevated as well as your LDL cholesterol. This can be due to diet and improved with dietary changes. Your blood counts are stable. Thyroid function is stable. Continue a good heart healthy diet and regular exercise.

## 2024-07-15 ENCOUNTER — Encounter: Payer: Self-pay | Admitting: Physician Assistant

## 2024-07-15 ENCOUNTER — Ambulatory Visit: Admitting: Physician Assistant

## 2024-07-15 VITALS — BP 100/62 | HR 72 | Ht 63.0 in | Wt 152.5 lb

## 2024-07-15 DIAGNOSIS — Z860101 Personal history of adenomatous and serrated colon polyps: Secondary | ICD-10-CM | POA: Diagnosis not present

## 2024-07-15 DIAGNOSIS — K580 Irritable bowel syndrome with diarrhea: Secondary | ICD-10-CM

## 2024-07-15 DIAGNOSIS — Z800 Family history of malignant neoplasm of digestive organs: Secondary | ICD-10-CM | POA: Diagnosis not present

## 2024-07-15 NOTE — Patient Instructions (Addendum)
   Take 2 Caplets with a full glass of water or other liquid (8 ounces/240 milliliters)  Once Daily.  Increase to 2 Caplets Twice daily if needed.     For Irritable Bowel Syndrome / Colon Spasm / Abdominal Cramps: IB Gard (Peppermint Oil) - Over the Counter Take 2 capsules Twice daily   Please follow up sooner if symptoms increase or worsen  Due to recent changes in healthcare laws, you may see the results of your imaging and laboratory studies on MyChart before your provider has had a chance to review them.  We understand that in some cases there may be results that are confusing or concerning to you. Not all laboratory results come back in the same time frame and the provider may be waiting for multiple results in order to interpret others.  Please give us  48 hours in order for your provider to thoroughly review all the results before contacting the office for clarification of your results.   Thank you for trusting me with your gastrointestinal care!   Ellouise Console, PA-C _______________________________________________________  If your blood pressure at your visit was 140/90 or greater, please contact your primary care physician to follow up on this.  _______________________________________________________  If you are age 75 or older, your body mass index should be between 23-30. Your Body mass index is 27.01 kg/m. If this is out of the aforementioned range listed, please consider follow up with your Primary Care Provider.  If you are age 109 or younger, your body mass index should be between 19-25. Your Body mass index is 27.01 kg/m. If this is out of the aformentioned range listed, please consider follow up with your Primary Care Provider.   ________________________________________________________  The [facility] providers would like to encourage you to use MYCHART to communicate with providers for non-urgent requests or questions.  Due to long hold times on the telephone, sending  your provider a message by [facility] may be a faster and more efficient way to get a response.  Please allow 48 business hours for a response.  Please remember that this is for non-urgent requests.  _______________________________________________________

## 2024-07-15 NOTE — Progress Notes (Signed)
 Ellouise Console, PA-C [address] [place], [place]  [address] Phone: [phone]   Primary Care Physician: Knute Thersia Bitters, FNP  Primary Gastroenterologist:  Ellouise Console, PA-C / Dr. Gordy Starch   Chief Complaint: Follow-up chronic diarrhea / IBS-D      HPI:   Gloria Dean is a 38 y.o. female returns for 3-month follow-up of chronic diarrhea.  She has had chronic intermittent diarrhea since 2019. She recently started taking OTC vitamin herbal supplement.  Her diarrhea has resolved since she started OTC vitamin.  She is not currently having any abdominal pain or diarrhea.  She states she does not need any medicine for diarrhea or IBS at this time.  Currently feeling better.  No GI symptoms today.  We discussed her previous GI workup and results at length.  07/09/2024 labs: Normal CBC, CMP, TSH.  11/2023: Fecal calprotectin normal.  Fecal pancreatic elastase normal.  Celiac labs negative.  Normal CRP.  Normal vitamin B12 and TSH.  12/25/2023 colonoscopy by Dr. Starch: 10 mm sessile serrated polyp removed from ascending colon.  6 mm sessile serrated polyp removed from transverse colon.  Small internal and external hemorrhoids.  Excellent prep.  Biopsies negative for microscopic colitis or IBD.  3-year repeat (2 12/2026).  It was recommended that patient try Viberzi  to help her diarrhea, however she wanted to wait until she was finished breast-feeding.  No previous EGD.  Patient still has her gallbladder.  Family history significant for maternal grandfather with colon cancer and maternal grandmother with celiac.  Possible family history of Crohn's (uncertain).  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   Current Outpatient Medications  Medication Sig Dispense Refill   acetaminophen  (TYLENOL ) 325 MG tablet Take 2 tablets (650 mg total) by mouth every 4 (four) hours as needed (for pain scale < 4). 30 tablet 0   Calcium  Carbonate Antacid (TUMS PO) Take 1 tablet by mouth 2 (two) times daily as needed (heartburn).     cetirizine (ZYRTEC) 10 MG tablet Take 10 mg by mouth daily as needed for allergies.     dicyclomine  (BENTYL ) 10 MG capsule Take 1 capsule (10 mg total) by mouth 4 (four) times daily -  before meals and at bedtime. 90 capsule 3   Eluxadoline  (VIBERZI ) 75 MG TABS Take 1 tablet (75 mg total) by mouth in the morning and at bedtime. 60 tablet 2   ferrous sulfate 325 (65 FE) MG EC tablet Take 325 mg by mouth daily with breakfast.     ibuprofen  (ADVIL ) 600 MG tablet Take 1 tablet (600 mg total) by mouth every 6 (six) hours. 30 tablet 0   magnesium  oxide (MAG-OX) 400 MG tablet Take 400 mg by mouth daily.     Current Facility-Administered Medications  Medication Dose Route Frequency Provider Last Rate Last Admin   0.9 %  sodium chloride  infusion  500 mL Intravenous Once Pyrtle, Gordy HERO, MD        Allergies as of 07/15/2024   (No Known Allergies)    Past Medical History:  Diagnosis Date   Anemia    Anxiety    Asthma with allergic rhinitis 08/07/2011   History of 2019 novel coronavirus disease (COVID-19) 10/2020   per pt mild symptoms that resolved   History of abnormal cervical Pap smear 2010   History of asthma    per pt as teen seasonal asthma but  no issue since   History of chlamydia    History of genital warts 12/17/2011   Treated with TCA    History of pregnancy induced hypertension    preeclampsia   History of recurrent miscarriages    Iron  deficiency    LGSIL on Pap smear of cervix 09/05/2011   Negative colpo--for repeat with HPV typing this pregnancy-Same pap--will repeat colpo--now wit LGSIL-->colpo--benign biopsy--repeat in 1 year.   Severe dysplasia of cervix (CIN III) 06/07/2021   06/04/21 Colposcopy Pathology after HGSIL pap showed CIN III >> LEEP 07/10/2021 showed CIN III with negative margins >> Pap 01/2022 Negative cytology, negative HPV . Needs annual pap and HPV testing every year  for three years>>If all negative, then cotesting every three years for at least 25 years.   Vitamin D  deficiency    Wears contact lenses     Past Surgical History:  Procedure Laterality Date   COLONOSCOPY     DILATION AND EVACUATION N/A 04/04/2021   Procedure: DILATATION AND EVACUATION with [name] testing;  Surgeon: Herchel Gloris LABOR, MD;  Location: [facility];  Service: Gynecology;  Laterality: N/A;  [name] testing   WISDOM TOOTH EXTRACTION     teen    Review of Systems:    All systems reviewed and negative except where noted in HPI.    Physical Exam:  BP 100/62 (BP Location: Left Arm, Patient Position: Sitting, Cuff Size: Normal)   Pulse 72   Ht 5' 3 (1.6 m) Comment: height measured without shoes  Wt 152 lb 8 oz (69.2 kg)   LMP 06/23/2024 (Exact Date)   Breastfeeding Yes   BMI 27.01 kg/m  Patient's last menstrual period was 06/23/2024 (exact date).  General: Well-nourished, well-developed in no acute distress.  Neuro: Alert and oriented x 3.  Grossly intact.  Psych: Alert and cooperative, normal mood and affect.   Imaging Studies: No results found.  Labs: CBC    Component Value Date/Time   WBC 10.1 07/09/2024 0905   WBC 16.0 (H) 07/22/2022 2156   RBC 4.38 07/09/2024 0905   RBC 3.83 (L) 07/22/2022 2156   HGB 12.9 07/09/2024 0905   HCT 39.9 07/09/2024 0905   PLT 303 07/09/2024 0905   MCV 91 07/09/2024 0905   MCH 29.5 07/09/2024 0905   MCH 31.3 07/22/2022 2156   MCHC 32.3 07/09/2024 0905   MCHC 36.4 (H) 07/22/2022 2156   RDW 12.2 07/09/2024 0905   LYMPHSABS 2.3 07/09/2024 0905   MONOABS 0.6 07/22/2011 0933   EOSABS 0.3 07/09/2024 0905   BASOSABS 0.1 07/09/2024 0905    CMP     Component Value Date/Time   NA 141 07/09/2024 0905   K 4.3 07/09/2024 0905   CL 102 07/09/2024 0905   CO2 21 07/09/2024 0905   GLUCOSE 82 07/09/2024 0905   GLUCOSE 98 07/22/2022 2156   BUN 9 07/09/2024 0905   CREATININE 0.75 07/09/2024 0905   CREATININE 0.85  02/16/2014 0951   CALCIUM 9.4 07/09/2024 0905   PROT 6.8 07/09/2024 0905   ALBUMIN 4.5 07/09/2024 0905   AST 18 07/09/2024 0905   ALT 27 07/09/2024 0905   ALKPHOS 117 (H) 07/09/2024 0905   BILITOT <0.2 07/09/2024 0905   GFRNONAA >60 07/22/2022 2156   GFRAA 123 06/03/2017 1011       Assessment and Plan:   Gloria Dean is a 38 y.o. y/o female returns for follow-up of chronic diarrhea.  Colonoscopy was negative for microscopic colitis or IBD.  Labs were  negative for celiac.  Fecal pancreatic elastase was normal, no evidence of pancreatic insufficiency.  She still has gallbladder.  Symptoms are most consistent with irritable bowel syndrome, diarrhea predominant.  Symptoms have currently resolved.  1.  Chronic diarrhea: Irritable bowel syndrome, diarrhea predominant - I discussed various OTC treatments for IBS including OTC Fibercon, Imodium, IB Guard. - I discussed various prescription treatments including Viberzi , Xifaxan, Dicyclomine , and Hyosciamine. - Patient declined prescription treatment at this time.  She is still breast feeding her youngest child and is currently weaning. - Discussed Low FODMAP diet and printed handout was given. - Her symptoms have currently improved, therefore she does not want any treatment at this time. - She will let us  know if she has flare-up of IBS-D in the future and if she wants to try any prescription treatment.    2.  History of sessile serrated colon polyps - 3-year repeat colonoscopy will be due 12/2026.  Ellouise Console, PA-C  Follow up in 3 years or sooner if she has recurrent GI symptoms.
